# Patient Record
Sex: Female | Born: 1950 | Race: Black or African American | Hispanic: No | Marital: Single | Smoking: Never smoker
Health system: Southern US, Community
[De-identification: ages and names within clinical notes are randomized; demographics above are authoritative.]

## PROBLEM LIST (undated history)

## (undated) DIAGNOSIS — I2699 Other pulmonary embolism without acute cor pulmonale: Secondary | ICD-10-CM

## (undated) DIAGNOSIS — E785 Hyperlipidemia, unspecified: Secondary | ICD-10-CM

## (undated) DIAGNOSIS — N189 Chronic kidney disease, unspecified: Secondary | ICD-10-CM

## (undated) DIAGNOSIS — I10 Essential (primary) hypertension: Secondary | ICD-10-CM

## (undated) DIAGNOSIS — E119 Type 2 diabetes mellitus without complications: Secondary | ICD-10-CM

## (undated) DIAGNOSIS — I639 Cerebral infarction, unspecified: Secondary | ICD-10-CM

## (undated) HISTORY — PX: IVC FILTER INSERTION: CATH118245

## (undated) HISTORY — PX: TUBAL LIGATION: SHX77

## (undated) HISTORY — DX: Cerebral infarction, unspecified: I63.9

---

## 2016-11-13 ENCOUNTER — Emergency Department (HOSPITAL_COMMUNITY): Payer: Medicare HMO

## 2016-11-13 ENCOUNTER — Encounter (HOSPITAL_COMMUNITY): Payer: Self-pay

## 2016-11-13 ENCOUNTER — Observation Stay (HOSPITAL_COMMUNITY)
Admission: EM | Admit: 2016-11-13 | Discharge: 2016-11-15 | Disposition: A | Discharge status: Home / Self Care | Payer: Medicare HMO | Attending: Internal Medicine | Admitting: Internal Medicine

## 2016-11-13 ENCOUNTER — Observation Stay (HOSPITAL_COMMUNITY): Payer: Medicare HMO

## 2016-11-13 DIAGNOSIS — Z86711 Personal history of pulmonary embolism: Secondary | ICD-10-CM | POA: Diagnosis not present

## 2016-11-13 DIAGNOSIS — I10 Essential (primary) hypertension: Secondary | ICD-10-CM | POA: Diagnosis not present

## 2016-11-13 DIAGNOSIS — D72819 Decreased white blood cell count, unspecified: Secondary | ICD-10-CM | POA: Diagnosis present

## 2016-11-13 DIAGNOSIS — E041 Nontoxic single thyroid nodule: Secondary | ICD-10-CM | POA: Insufficient documentation

## 2016-11-13 DIAGNOSIS — D649 Anemia, unspecified: Secondary | ICD-10-CM | POA: Insufficient documentation

## 2016-11-13 DIAGNOSIS — Z7901 Long term (current) use of anticoagulants: Secondary | ICD-10-CM | POA: Insufficient documentation

## 2016-11-13 DIAGNOSIS — E785 Hyperlipidemia, unspecified: Secondary | ICD-10-CM | POA: Diagnosis not present

## 2016-11-13 DIAGNOSIS — I639 Cerebral infarction, unspecified: Secondary | ICD-10-CM | POA: Diagnosis not present

## 2016-11-13 DIAGNOSIS — I6330 Cerebral infarction due to thrombosis of unspecified cerebral artery: Secondary | ICD-10-CM | POA: Diagnosis not present

## 2016-11-13 DIAGNOSIS — R0989 Other specified symptoms and signs involving the circulatory and respiratory systems: Secondary | ICD-10-CM | POA: Diagnosis not present

## 2016-11-13 DIAGNOSIS — R791 Abnormal coagulation profile: Secondary | ICD-10-CM | POA: Diagnosis not present

## 2016-11-13 DIAGNOSIS — Z7982 Long term (current) use of aspirin: Secondary | ICD-10-CM | POA: Insufficient documentation

## 2016-11-13 DIAGNOSIS — D497 Neoplasm of unspecified behavior of endocrine glands and other parts of nervous system: Secondary | ICD-10-CM | POA: Diagnosis present

## 2016-11-13 DIAGNOSIS — Z794 Long term (current) use of insulin: Secondary | ICD-10-CM | POA: Insufficient documentation

## 2016-11-13 DIAGNOSIS — Z79899 Other long term (current) drug therapy: Secondary | ICD-10-CM | POA: Diagnosis not present

## 2016-11-13 DIAGNOSIS — G8191 Hemiplegia, unspecified affecting right dominant side: Secondary | ICD-10-CM | POA: Insufficient documentation

## 2016-11-13 DIAGNOSIS — F419 Anxiety disorder, unspecified: Secondary | ICD-10-CM | POA: Diagnosis present

## 2016-11-13 DIAGNOSIS — E119 Type 2 diabetes mellitus without complications: Secondary | ICD-10-CM | POA: Diagnosis not present

## 2016-11-13 DIAGNOSIS — E0789 Other specified disorders of thyroid: Secondary | ICD-10-CM

## 2016-11-13 DIAGNOSIS — G4733 Obstructive sleep apnea (adult) (pediatric): Secondary | ICD-10-CM | POA: Diagnosis not present

## 2016-11-13 DIAGNOSIS — Z888 Allergy status to other drugs, medicaments and biological substances status: Secondary | ICD-10-CM | POA: Insufficient documentation

## 2016-11-13 DIAGNOSIS — I119 Hypertensive heart disease without heart failure: Secondary | ICD-10-CM | POA: Diagnosis not present

## 2016-11-13 DIAGNOSIS — I351 Nonrheumatic aortic (valve) insufficiency: Secondary | ICD-10-CM | POA: Insufficient documentation

## 2016-11-13 DIAGNOSIS — R29898 Other symptoms and signs involving the musculoskeletal system: Secondary | ICD-10-CM | POA: Diagnosis present

## 2016-11-13 HISTORY — DX: Essential (primary) hypertension: I10

## 2016-11-13 HISTORY — DX: Hyperlipidemia, unspecified: E78.5

## 2016-11-13 HISTORY — DX: Other pulmonary embolism without acute cor pulmonale: I26.99

## 2016-11-13 HISTORY — DX: Type 2 diabetes mellitus without complications: E11.9

## 2016-11-13 LAB — PROTIME-INR
INR: 2.57
Prothrombin Time: 28.1 seconds — ABNORMAL HIGH (ref 11.4–15.2)

## 2016-11-13 LAB — GLUCOSE, CAPILLARY: Glucose-Capillary: 97 mg/dL (ref 65–99)

## 2016-11-13 MED ORDER — ACETAMINOPHEN 160 MG/5ML PO SOLN: 650.0000 mg | ORAL | Status: DC | PRN

## 2016-11-13 MED ORDER — ACETAMINOPHEN 325 MG PO TABS
650.0000 mg | ORAL_TABLET | ORAL | Status: DC | PRN
Start: 2016-11-13 — End: 2016-11-15

## 2016-11-13 MED ORDER — SENNOSIDES-DOCUSATE SODIUM 8.6-50 MG PO TABS: 1.0000 | ORAL_TABLET | Freq: Every evening | ORAL | Status: DC | PRN

## 2016-11-13 MED ORDER — ASPIRIN 325 MG PO TABS
325.0000 mg | ORAL_TABLET | Freq: Every day | ORAL | Status: DC
  Administered 2016-11-13 – 2016-11-14 (×2): 325 mg via ORAL
  Filled 2016-11-13 (×2): qty 1

## 2016-11-13 MED ORDER — ASPIRIN 300 MG RE SUPP: 300.0000 mg | Freq: Every day | RECTAL | Status: DC

## 2016-11-13 MED ORDER — ACETAMINOPHEN 650 MG RE SUPP: 650.0000 mg | RECTAL | Status: DC | PRN

## 2016-11-13 MED ORDER — ENOXAPARIN SODIUM 40 MG/0.4ML ~~LOC~~ SOLN: 40.0000 mg | SUBCUTANEOUS | Status: DC

## 2016-11-13 MED ORDER — SODIUM CHLORIDE 0.9 % IV SOLN
INTRAVENOUS | Status: DC
  Administered 2016-11-13: via INTRAVENOUS

## 2016-11-13 MED ORDER — STROKE: EARLY STAGES OF RECOVERY BOOK
Freq: Once | Status: DC
  Filled 2016-11-13: qty 1

## 2016-11-13 MED ORDER — PAROXETINE HCL 10 MG PO TABS
10.0000 mg | ORAL_TABLET | Freq: Every day | ORAL | Status: DC
  Administered 2016-11-14 – 2016-11-15 (×2): 10 mg via ORAL
  Filled 2016-11-13 (×2): qty 1

## 2016-11-13 NOTE — ED Triage Notes (Signed)
Pt. Here from [facility] via their critical care transport. Pt. Seen at 11:15 for right facial droop and right arm flaccid. Sudden onset at 9 am. Pt. On coumadin for PE and INR 2.8-would have been a candidate for TPA otherwise. Facial droop resolved, but right arm still very weak. Pt. Has no complaints. Pt. Sent here due to family request. Accepted by neurology. EDP at bedside now.

## 2016-11-13 NOTE — Progress Notes (Signed)
ANTICOAGULATION CONSULT NOTE - Initial Consult  Pharmacy Consult for  Coumadin  Indication: history of PE  Allergies  Allergen Reactions  . Ativan [Lorazepam] Other (See Comments)    COMBATIVE AND EXTREMELY VIOLENT    Patient Measurements: Height: 5' 5.5" (166.4 cm) Weight: 180 lb 11.2 oz (82 kg) IBW/kg (Calculated) : 58.15  Vital Signs: Temp: 99 F (37.2 C) (07/29 2100) Temp Source: Oral (07/29 2100) BP: 181/87 (07/29 2100) Pulse Rate: 80 (07/29 2100)  Labs:  Recent Labs  11/13/16 2109  LABPROT 28.1*  INR 2.57    CrCl cannot be calculated (No order found.).   Medical History: Past Medical History:  Diagnosis Date  . Diabetes mellitus without complication ([place])   . Hyperlipidemia   . Hypertension   . Pulmonary emboli (HCC)     Medications:  Prescriptions Prior to Admission  Medication Sig Dispense Refill Last Dose  . amLODipine (NORVASC) 5 MG tablet Take 5 mg by mouth daily.   11/12/2016 at Unknown time  . hydrochlorothiazide (HYDRODIURIL) 12.5 MG tablet Take 12.5 mg by mouth daily.   11/12/2016 at Unknown time  . metFORMIN (GLUCOPHAGE) 500 MG tablet Take 500 mg by mouth 2 (two) times daily with a meal.   11/12/2016 at Unknown time  . PARoxetine (PAXIL) 10 MG tablet Take 10 mg by mouth daily.   11/12/2016 at Unknown time  . triamcinolone cream (KENALOG) 0.1 % Apply 1 application topically daily as needed for itching.   11/12/2016 at Unknown time  . valsartan (DIOVAN) 160 MG tablet Take 160 mg by mouth daily.   11/12/2016 at Unknown time  . warfarin (COUMADIN) 5 MG tablet Take 5-7.5 mg by mouth See admin instructions. Take 5mg  on M W F and take 7.5mg  on Tue Thur Sat Sun   11/12/2016 at pm   Scheduled:  .  stroke: mapping our early stages of recovery book   Does not apply Once  . aspirin  300 mg Rectal Daily   Or  . aspirin  325 mg Oral Daily  . enoxaparin (LOVENOX) injection  40 mg Subcutaneous Q24H  . [START ON 11/14/2016] PARoxetine  10 mg Oral Daily     Assessment: 66 yo female here with CVA vs TIA. He is on coumadin PTA for history of PE.  Neuro recommends an MRI with further plans (continue or discontinue anticoagulation) based on results  -INR= 2.57  Home coumadin dose: 2.5mg /day except 5mg  on [facility] (last clinic visit 10/04/2016; INR= 2.8) -last dose taken 7/28  Goal of Therapy:  INR 2-3 Monitor platelets by anticoagulation protocol: Yes   Plan:  -No coumadin tonight -Will follow patient progress and plans for anticoagulation -PT/INR in am  Hildred Laser, Pharm D 11/13/2016 10:24 PM

## 2016-11-13 NOTE — ED Notes (Signed)
Admitting at bedside 

## 2016-11-13 NOTE — ED Notes (Signed)
Hospitalist states he dosen't know why the patient needs another CTA, talking to neurology to figure out the plan.  Okay to transport to the floor.

## 2016-11-13 NOTE — H&P (Signed)
History and Physical    Renee Payne PFX:[id number] DOB: 07/05/1950 DOA: 11/13/2016  PCP: Myrtis Hopping., MD   Patient coming from: Home, by way of [facility]  Chief Complaint: Right-sided weakness   HPI: Renee Payne is a 66 y.o. female with medical history significant for hypertension, hyperlipidemia, type 2 diabetes mellitus, and history of PE on Coumadin, now presenting to the emergency department for evaluation of right-sided weakness. Patient reports that she had been in her usual state until this morning when she woke with weakness involving her right side and possible right-sided facial droop. Onset was approximately 9 AM and she was brought in to the ED at [facility] for evaluation. She denied any recent fall or trauma, denies any similar symptoms previously, and denies any fevers, chills, chest pain, or palpitations.  In the outside ED, she had a normal chemistry panel and CBC with leukopenia, WBC 2700, and a stable normocytic anemia with hemoglobin of 12.1. Troponin was undetectable and INR was therapeutic at 2.81. She had EKG performed, demonstrating a normal sinus rhythm with LVH. Neurology was consulted by the ED physician, tPA was not given secondary to the elevated INR. She underwent CTA head and neck with no significant stenosis and no definite acute CVA. Incidental notation is made on the CTA of the thyroid nodule with recommendation to consider FNA.  Patient was transported to the emergency department to [facility] at the family's request. She has remained hemodynamically stable, in no apparent respiratory distress, reports continued improvement in her deficits, noting that she is almost back to baseline, and she will be observed on the telemetry unit for ongoing evaluation and management of a suspected acute stroke.   Review of Systems:  All other systems reviewed and apart from HPI, are negative.  Past Medical History:    Diagnosis Date  . Diabetes mellitus without complication ([place])   . Hyperlipidemia   . Hypertension   . Pulmonary emboli [facility])     Past Surgical History:  Procedure Laterality Date  . IVC FILTER INSERTION       reports that she has never smoked. She has never used smokeless tobacco. She reports that she does not drink alcohol or use drugs.  Allergies  Allergen Reactions  . Ativan [Lorazepam] Other (See Comments)    COMBATIVE AND EXTREMELY VIOLENT    History reviewed. No pertinent family history.   Prior to Admission medications   Medication Sig Start Date End Date Taking? Authorizing Provider  amLODipine (NORVASC) 5 MG tablet Take 5 mg by mouth daily. 09/20/16  Yes [provider]  hydrochlorothiazide (HYDRODIURIL) 12.5 MG tablet Take 12.5 mg by mouth daily. 09/19/16  Yes [provider]  metFORMIN (GLUCOPHAGE) 500 MG tablet Take 500 mg by mouth 2 (two) times daily with a meal.   Yes [provider]  PARoxetine (PAXIL) 10 MG tablet Take 10 mg by mouth daily.   Yes [provider]  triamcinolone cream (KENALOG) 0.1 % Apply 1 application topically daily as needed for itching. 04/16/15  Yes [provider]  valsartan (DIOVAN) 160 MG tablet Take 160 mg by mouth daily.   Yes [provider]  warfarin (COUMADIN) 5 MG tablet Take 5-7.5 mg by mouth See admin instructions. Take 5mg  on M W F and take 7.5mg  on Tue Thur Sat Sun   Yes [provider]    Physical Exam: Vitals:   11/13/16 1630 11/13/16 1700 11/13/16 1715 11/13/16 1900  BP: (!) 168/90 (!) 149/81 (!) 146/81 (!) 151/90  Pulse: 79 60 (!) 59 84  Resp: 15 17 20 20   SpO2: 96% 93% 97% 99%      Constitutional: NAD, calm, comfortable, obese. Eyes: PERTLA, lids and conjunctivae normal ENMT: Mucous membranes are moist. Posterior pharynx clear of any exudate or lesions.   Neck: normal, supple, no masses, no thyromegaly Respiratory: clear to auscultation bilaterally, no  wheezing, no crackles. Normal respiratory effort.    Cardiovascular: S1 & S2 heard, regular rate and rhythm. No extremity edema. No significant JVD. Abdomen: No distension, no tenderness, no masses palpated. Bowel sounds normal.  Musculoskeletal: no clubbing / cyanosis. No joint deformity upper and lower extremities.   Skin: no significant rashes, lesions, ulcers. Warm, dry, well-perfused. Neurologic: CN 2-12 grossly intact. Sensation intact, DTR normal. Proximal right arm strength 4/5, strength 5/5 elsewhere.  Psychiatric: Alert and oriented x 3. Calm and cooperative.     Labs on Admission: I have personally reviewed following labs and imaging studies  CBC: No results for input(s): WBC, NEUTROABS, HGB, HCT, MCV, PLT in the last 168 hours. Basic Metabolic Panel: No results for input(s): NA, K, CL, CO2, GLUCOSE, BUN, CREATININE, CALCIUM, MG, PHOS in the last 168 hours. GFR: CrCl cannot be calculated (No order found.). Liver Function Tests: No results for input(s): AST, ALT, ALKPHOS, BILITOT, PROT, ALBUMIN in the last 168 hours. No results for input(s): LIPASE, AMYLASE in the last 168 hours. No results for input(s): AMMONIA in the last 168 hours. Coagulation Profile: No results for input(s): INR, PROTIME in the last 168 hours. Cardiac Enzymes: No results for input(s): CKTOTAL, CKMB, CKMBINDEX, TROPONINI in the last 168 hours. BNP (last 3 results) No results for input(s): PROBNP in the last 8760 hours. HbA1C: No results for input(s): HGBA1C in the last 72 hours. CBG: No results for input(s): GLUCAP in the last 168 hours. Lipid Profile: No results for input(s): CHOL, HDL, LDLCALC, TRIG, CHOLHDL, LDLDIRECT in the last 72 hours. Thyroid Function Tests: No results for input(s): TSH, T4TOTAL, FREET4, T3FREE, THYROIDAB in the last 72 hours. Anemia Panel: No results for input(s): VITAMINB12, FOLATE, FERRITIN, TIBC, IRON, RETICCTPCT in the last 72 hours. Urine analysis: No results found  for: COLORURINE, APPEARANCEUR, LABSPEC, PHURINE, GLUCOSEU, HGBUR, BILIRUBINUR, KETONESUR, PROTEINUR, UROBILINOGEN, NITRITE, LEUKOCYTESUR Sepsis Labs: @LABRCNTIP (procalcitonin:4,lacticidven:4) )No results found for this or any previous visit (from the past 240 hour(s)).   Radiological Exams on Admission: No results found.  EKG: Independently reviewed. NSR, LVH.   Assessment/Plan  1. Right-sided weakness, suspected CVA  - Pt presented to the ED at [facility] with ~2 hours of right-sided weakness and slurred speech; reports that slurred speech was already improving  - She was not given tPA in light of elevated INR  - She had CTA head and neck with no significant stenosis, no definite acute CVA, and incidental thyroid nodule  - She was transferred to [facility] ED at family's request  - Neurology is consulting and much appreciated  - Plan for MRI with sedation, cardiac monitoring, frequent neuro checks, echocardiogram, PT/OT/SLP evals, A1c and fasting lipid panel, and risk-factor modification   2. Hx of PE  - Pt reports hx of PE, for which she takes coumadin  - INR was therapeutic at the outside hospital, 2.81  - If MRI is negative for bleed, she will be continued on coumadin with pharmacy to dose   3. Anxiety  - Continue Paxil   4. Hypertension - BP at goal  -  Hold Norvasc, Diovan, and HCTZ pending MRI for ?acute ischemic CVA   5. Type II DM  - No A1c on file  - Managed at home with metformin only  - Check CBG's q4h   - Start a low-intensity Novolog sliding-scale and adjust prn    6. Leukopenia  - WBC was 2,700 at [facility], previously normal  - No fever or infectious s/s  - Plan to culture if febrile, repeat CBC in am    7. OSA - Continue CPAP qHS   8. Thyroid nodule  - Noted incidentally on CTA head and neck from outside hospital - Follow-up with PCP recommended for consideration of FNA    DVT prophylaxis: sq Lovenox  Code Status: Full  Family  Communication: Discussed with patient Disposition Plan: Observe on telemetry Consults called: Neurology Admission status: Observation    Vianne Bulls, MD [facility] Pager [phone]  If 7PM-7AM, please contact night-coverage [email or web address] Password [facility]  11/13/2016, 7:15 PM

## 2016-11-13 NOTE — Consult Note (Addendum)
Neurology Consultation  Reason for Consult: Stroke Referring Physician: Dr. Dolly Rias  CC: Stroke transfer from [facility] hospital on family's request  History is obtained from: Patient and Dr. Lovena Le from [facility] regional  HPI: Renee Payne is a 66 y.o. female past medical history of diabetes, hypertension, hyperlipidemia, pulmonary emboli on Coumadin, who was in her usual state of health until about 9 AM this morning when she started noticing right face arm and leg weakness with slurred speech. She says that she went to bed normal yesterday. She woke up at 6 AM when she was normal. She went to take a nap, she woke up around 8 and felt normal when she woke up again around 9 AM, she could not move the right side of her body. Her speech was also slurred. She was taken to [facility], where she was per report evaluated by telemetry neurology. She was on Coumadin and her INR was above 1.7, for that reason, even with the initial NIH stroke scale of 5, she was not given TPA. The family requested that the patient be transferred to [facility]. The patient was transferred from the ER over at [facility] to the ER at [facility]. The patient was seen in the emergency room. Her symptoms had nearly resolved with the exception of right arm weakness. She denied any headaches. She denies any tingling or numbness. She denies any nausea vomiting. She denies any short of breath or chest pains at this time. She denies any preceding illnesses, fevers, chills. Denies any abnormal psychosocial stressors. Denies any history of strokes in the past  LKW: 9 AM tpa given?: no, therapeutic on Coumadin, presented initially to the outside hospital where decision was made not to give TPA. Arrival at [facility] ED after the TPA window. Low NIH stroke scale at [facility] Premorbid modified Rankin scale (mRS):  0-Completely asymptomatic and back to baseline post-stroke   ROS: A 14 point  ROS was performed and is negative except as noted in the HPI.    Past Medical History:  Diagnosis Date  . Diabetes mellitus without complication ([place])   . Hyperlipidemia   . Hypertension   . Pulmonary emboli ([place])     History reviewed. No pertinent family history.  Social History:   reports that she has never smoked. She has never used smokeless tobacco. She reports that she does not drink alcohol or use drugs.  Medication list-unavailable at the time of encounter. No current facility-administered medications for this encounter.  No current outpatient prescriptions on file.   Exam: Current vital signs: There were no vitals taken for this visit. Vital signs in last 24 hours:  heart rate 72, blood pressure 170/95, saturation 99% GENERAL: Awake, alert in NAD HEENT: - Normocephalic and atraumatic, dry mm, no LN++, no Thyromegally LUNGS - Clear to auscultation bilaterally with no wheezes CV - S1S2 RRR, no m/r/g, equal pulses bilaterally. ABDOMEN - Soft, nontender, nondistended with normoactive BS Ext: warm, well perfused, intact peripheral pulses, _no edema  NEURO:  Mental Status: AA&Ox3  Language: speech is not dysarthric  Naming, repetition, fluency, and comprehension intact. Cranial Nerves: PERR. EOMI, visual fields full, no facial asymmetry, facial sensation intact, hearing intact, tongue/uvula/soft palate midline, normal sternocleidomastoid and trapezius muscle strength. No evidence of tongue atrophy or fibrillations Motor: 3 out of 5 right upper extremity with the arm drifting to bed in 10 seconds, 4/5 right lower extremity, 5/5 left upper and left lower extremity.  Normal tone, normal range of motion, normal bulk. Sensation- Intact to light touch bilaterally, no extinction Coordination: FTN intact bilaterally, no ataxia in BLE. Gait- deferred  NIHSS 2 for right upper extremity.   Labs No labs available at the time of the encounter  Imaging No imaging available at  the time of the encounter. Would recommend imaging as below Outside imaging per report showed no evidence of bleed on noncontrast CT. CTA was also obtained. Unable to locate the disc at this time.   Assessment:  66 year old African-American woman with past medical history of diabetes hypertension hyperlipidemia who was on Coumadin for PE with last known normal this morning at around 9 AM with sudden onset of right-sided weakness. [facility] with initial stroke scale of 5. Family requested transfer to [place] not given at the outside hospital because of therapeutic INR. Upon arrival and assessment at [facility] ED, NIH stroke scale of 2 for right upper extremity weakness. No sensory loss, no cranial nerve findings, no cortical signs. No suspicion for large vessel occlusion. 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  Recommendations: -I would recommend obtaining a stat MRI of the brain. Depending on the size of the infarct, decision can be made whether to continue anticoagulation or discontinue it. No need for reversal at this time if the MRI does not show a bleed. ---Please call neurology after the MRI is done. Depending on the size of the bleed, decision on continuing anticoag with Coumadin or selecting heparin drip without bolus will be made. -Admit to hospitalist -Telemetry -Frequent neurochecks -Transthoracic echocardiogram -Hemoglobin A1c, lipid panel -Decision on continuing and regulation versus using antiplatelet based on imaging results. -Risk factor modification -PT OT and speech therapy consult Please page the stroke team with any questions from 8 AM to 4 PM. Stroke team will follow in the morning.  -- Amie Portland, MD [facility] [phone]  If 7pm to 7am, please call on call as listed on AMION.  CRITICAL CARE ADDENDUM This patient is critically ill and at significant risk of neurological worsening, death and care requires constant monitoring of vital signs, hemodynamics,respiratory and cardiac monitoring, neurological assessment, discussion with family, other specialists and medical decision making of high complexity. I spent 45  minutes of neurocritical care time  in the care of  this patient.

## 2016-11-13 NOTE — ED Notes (Signed)
MRI contacted RN because patient did not tolerate even laying in MRI. Neurology paged at this time-waiting for call back. Pt. Allergic to ativan and states last time she had it she was so aggressive and violent they had to sedate her with precedex.

## 2016-11-13 NOTE — Progress Notes (Signed)
Patient refuses CPAP for tonight.  RT does not stock nasal pillows (interface used by patient at home).

## 2016-11-14 ENCOUNTER — Encounter (HOSPITAL_COMMUNITY): Payer: Self-pay | Admitting: *Deleted

## 2016-11-14 ENCOUNTER — Observation Stay (HOSPITAL_BASED_OUTPATIENT_CLINIC_OR_DEPARTMENT_OTHER): Payer: Medicare HMO

## 2016-11-14 ENCOUNTER — Observation Stay (HOSPITAL_COMMUNITY): Payer: Medicare HMO

## 2016-11-14 DIAGNOSIS — I63312 Cerebral infarction due to thrombosis of left middle cerebral artery: Secondary | ICD-10-CM | POA: Diagnosis not present

## 2016-11-14 DIAGNOSIS — E0789 Other specified disorders of thyroid: Secondary | ICD-10-CM | POA: Diagnosis present

## 2016-11-14 DIAGNOSIS — I351 Nonrheumatic aortic (valve) insufficiency: Secondary | ICD-10-CM

## 2016-11-14 DIAGNOSIS — Z86711 Personal history of pulmonary embolism: Secondary | ICD-10-CM

## 2016-11-14 DIAGNOSIS — I10 Essential (primary) hypertension: Secondary | ICD-10-CM | POA: Diagnosis not present

## 2016-11-14 DIAGNOSIS — D72819 Decreased white blood cell count, unspecified: Secondary | ICD-10-CM | POA: Diagnosis not present

## 2016-11-14 DIAGNOSIS — E119 Type 2 diabetes mellitus without complications: Secondary | ICD-10-CM

## 2016-11-14 DIAGNOSIS — G4733 Obstructive sleep apnea (adult) (pediatric): Secondary | ICD-10-CM

## 2016-11-14 DIAGNOSIS — Z9989 Dependence on other enabling machines and devices: Secondary | ICD-10-CM

## 2016-11-14 DIAGNOSIS — D497 Neoplasm of unspecified behavior of endocrine glands and other parts of nervous system: Secondary | ICD-10-CM | POA: Diagnosis present

## 2016-11-14 DIAGNOSIS — R0989 Other specified symptoms and signs involving the circulatory and respiratory systems: Secondary | ICD-10-CM

## 2016-11-14 LAB — GLUCOSE, CAPILLARY
GLUCOSE-CAPILLARY: 104 mg/dL — AB (ref 65–99)
GLUCOSE-CAPILLARY: 140 mg/dL — AB (ref 65–99)
Glucose-Capillary: 114 mg/dL — ABNORMAL HIGH (ref 65–99)
Glucose-Capillary: 134 mg/dL — ABNORMAL HIGH (ref 65–99)
Glucose-Capillary: 97 mg/dL (ref 65–99)
Glucose-Capillary: 98 mg/dL (ref 65–99)

## 2016-11-14 LAB — LIPID PANEL
CHOLESTEROL: 254 mg/dL — AB (ref 0–200)
HDL: 42 mg/dL (ref >40)
LDL Cholesterol: 190 mg/dL — ABNORMAL HIGH (ref 0–99)
Total CHOL/HDL Ratio: 6 RATIO
Triglycerides: 112 mg/dL (ref <150)
VLDL: 22 mg/dL (ref 0–40)

## 2016-11-14 LAB — CBC
HEMATOCRIT: 36.3 % (ref 36.0–46.0)
HEMOGLOBIN: 11.6 g/dL — AB (ref 12.0–15.0)
MCH: 28.2 pg (ref 26.0–34.0)
MCHC: 32 g/dL (ref 30.0–36.0)
MCV: 88.3 fL (ref 78.0–100.0)
Platelets: 250 10*3/uL (ref 150–400)
RBC: 4.11 MIL/uL (ref 3.87–5.11)
RDW: 15.7 % — ABNORMAL HIGH (ref 11.5–15.5)
WBC: 3.1 10*3/uL — AB (ref 4.0–10.5)

## 2016-11-14 LAB — BASIC METABOLIC PANEL
ANION GAP: 7 (ref 5–15)
BUN: 13 mg/dL (ref 6–20)
CALCIUM: 9.2 mg/dL (ref 8.9–10.3)
CO2: 28 mmol/L (ref 22–32)
Chloride: 108 mmol/L (ref 101–111)
Creatinine, Ser: 0.96 mg/dL (ref 0.44–1.00)
GFR calc non Af Amer: 59 mL/min — ABNORMAL LOW (ref >60)
Glucose, Bld: 94 mg/dL (ref 65–99)
Potassium: 3.7 mmol/L (ref 3.5–5.1)
Sodium: 143 mmol/L (ref 135–145)

## 2016-11-14 LAB — URINALYSIS, ROUTINE W REFLEX MICROSCOPIC
BACTERIA UA: NONE SEEN
BILIRUBIN URINE: NEGATIVE
Glucose, UA: NEGATIVE mg/dL
Ketones, ur: NEGATIVE mg/dL
NITRITE: NEGATIVE
PH: 7 (ref 5.0–8.0)
Protein, ur: NEGATIVE mg/dL
SPECIFIC GRAVITY, URINE: 1.013 (ref 1.005–1.030)

## 2016-11-14 LAB — ECHOCARDIOGRAM COMPLETE
Height: 65.5 in
Weight: 2891.2 oz

## 2016-11-14 LAB — IRON AND TIBC
IRON: 58 ug/dL (ref 28–170)
Saturation Ratios: 20 % (ref 10.4–31.8)
TIBC: 287 ug/dL (ref 250–450)
UIBC: 229 ug/dL

## 2016-11-14 LAB — PROTIME-INR
INR: 2.6
PROTHROMBIN TIME: 28.4 s — AB (ref 11.4–15.2)

## 2016-11-14 LAB — RETICULOCYTES
RBC.: 4.11 MIL/uL (ref 3.87–5.11)
RETIC COUNT ABSOLUTE: 65.8 10*3/uL (ref 19.0–186.0)
Retic Ct Pct: 1.6 % (ref 0.4–3.1)

## 2016-11-14 LAB — FERRITIN: Ferritin: 86 ng/mL (ref 11–307)

## 2016-11-14 LAB — FOLATE: FOLATE: 9.9 ng/mL (ref >5.9)

## 2016-11-14 LAB — VITAMIN B12: VITAMIN B 12: 297 pg/mL (ref 180–914)

## 2016-11-14 MED ORDER — WARFARIN - PHARMACIST DOSING INPATIENT
Freq: Every day | Status: DC
  Administered 2016-11-14: 18:00:00

## 2016-11-14 MED ORDER — INSULIN ASPART 100 UNIT/ML ~~LOC~~ SOLN: 0.0000 [IU] | SUBCUTANEOUS | Status: DC

## 2016-11-14 MED ORDER — ATORVASTATIN CALCIUM 40 MG PO TABS: 40.0000 mg | ORAL_TABLET | Freq: Every day | ORAL | Status: DC

## 2016-11-14 MED ORDER — INSULIN ASPART 100 UNIT/ML ~~LOC~~ SOLN: 0.0000 [IU] | Freq: Three times a day (TID) | SUBCUTANEOUS | Status: DC

## 2016-11-14 MED ORDER — WARFARIN SODIUM 5 MG PO TABS
2.5000 mg | ORAL_TABLET | Freq: Once | ORAL | Status: AC
  Administered 2016-11-14: 2.5 mg via ORAL
  Filled 2016-11-14: qty 1

## 2016-11-14 MED ORDER — INSULIN ASPART 100 UNIT/ML ~~LOC~~ SOLN: 0.0000 [IU] | Freq: Every day | SUBCUTANEOUS | Status: DC

## 2016-11-14 MED ORDER — VITAMIN B-12 100 MCG PO TABS
500.0000 ug | ORAL_TABLET | Freq: Every day | ORAL | Status: DC
  Administered 2016-11-14 – 2016-11-15 (×2): 500 ug via ORAL
  Filled 2016-11-14 (×3): qty 5

## 2016-11-14 MED ORDER — ASPIRIN EC 81 MG PO TBEC
81.0000 mg | DELAYED_RELEASE_TABLET | Freq: Every day | ORAL | Status: DC
  Administered 2016-11-15: 81 mg via ORAL
  Filled 2016-11-14: qty 1

## 2016-11-14 MED ORDER — AMLODIPINE BESYLATE 5 MG PO TABS
5.0000 mg | ORAL_TABLET | Freq: Every day | ORAL | Status: DC
  Administered 2016-11-14 – 2016-11-15 (×2): 5 mg via ORAL
  Filled 2016-11-14 (×2): qty 1

## 2016-11-14 NOTE — Progress Notes (Signed)
Occupational Therapy Evaluation Patient Details Name: Renee Payne MRN: [id number] DOB: [birth year] Today's Date: 11/14/2016    History of Present Illness 66 y.o. female with medical history significant for hypertension, hyperlipidemia, type 2 diabetes mellitus, and history of PE on Coumadin, now presenting to the emergency department for evaluation of right-sided weakness. Transferred from [facility] hospital to [facility].CVA work up underway.    Clinical Impression   PTA, pt lived with her sister and was independent with mobility and ADL. Occasionally used a RW as needed. Pt presents primarily with RUE weakness. Pt will benefit from outpt OT at the neuro outpt center.Will follow up to address established goals and modified RW handle to assist with mobility.     Follow Up Recommendations  Outpatient OT;Supervision - Intermittent (neuro outpt)    Equipment Recommendations  None recommended by OT    Recommendations for Other Services       Precautions / Restrictions Precautions Precautions: None      Mobility Bed Mobility Overal bed mobility: Modified Independent                Transfers Overall transfer level: Modified independent                    Balance    no apparent balance deficits                                       ADL either performed or assessed with clinical judgement   ADL Overall ADL's : Needs assistance/impaired Eating/Feeding: Minimal assistance Eating/Feeding Details (indicate cue type and reason): A for cutting food/ opening packages Grooming: Minimal assistance   Upper Body Bathing: Minimal assistance;Sitting   Lower Body Bathing: Min guard   Upper Body Dressing : Minimal assistance   Lower Body Dressing: Minimal assistance   Toilet Transfer: Supervision/safety   Toileting- Clothing Manipulation and Hygiene: Set up       Functional mobility during ADLs: Supervision/safety       Vision Baseline  Vision/History: No visual deficits Vision Assessment?: No apparent visual deficits     Perception     Praxis Praxis Praxis tested?: Within functional limits    Pertinent Vitals/Pain Pain Assessment: No/denies pain     Hand Dominance Right   Extremity/Trunk Assessment Upper Extremity Assessment Upper Extremity Assessment: RUE deficits/detail RUE Deficits / Details: Brunstrom level V arm/hand. Substitutes with hiking shoulder due to proximal weakness. Poor isolated finger movement. Requires cues to use RUE.  RUE Coordination: decreased fine motor;decreased gross motor   Lower Extremity Assessment Lower Extremity Assessment: Defer to PT evaluation   Cervical / Trunk Assessment Cervical / Trunk Assessment: Normal   Communication Communication Communication: No difficulties   Cognition Arousal/Alertness: Awake/alert Behavior During Therapy: WFL for tasks assessed/performed Overall Cognitive Status: Within Functional Limits for tasks assessed                                     General Comments       Exercises Exercises: Other exercises Other Exercises Other Exercises: lap slides Other Exercises:  encouraging pt to use hand/arm  in functional movement patterns   Shoulder Instructions      Home Living Family/patient expects to be discharged to:: Private residence Living Arrangements: Alone;Children Available Help at Discharge: Family;Available 24 hours/day Type of Home: House  Bathroom Shower/Tub: Therapist, art: No   Home Equipment: Bedside commode;Walker - 2 wheels      Lives With:  (sister)    Prior Functioning/Environment Level of Independence: Independent (enjoyed cooking)                 OT Problem List: Decreased strength;Decreased range of motion;Decreased coordination;Decreased knowledge of use of DME or AE;Obesity;Impaired tone;Impaired UE functional  use      OT Treatment/Interventions: Self-care/ADL training;Therapeutic exercise;Neuromuscular education;DME and/or AE instruction;Therapeutic activities;Splinting;Patient/family education    OT Goals(Current goals can be found in the care plan section) Acute Rehab OT Goals Patient Stated Goal: to be able to cook again OT Goal Formulation: With patient Time For Goal Achievement: 11/28/16 Potential to Achieve Goals: Good ADL Goals Pt Will Perform Upper Body Dressing: with modified independence;sitting Pt Will Perform Lower Body Dressing: with modified independence;sit to/from stand Pt Will Perform Toileting - Clothing Manipulation and hygiene: with modified independence;sit to/from stand Pt/caregiver will Perform Home Exercise Program: Increased strength;Right Upper extremity;Independently;With written HEP provided  OT Frequency: Min 3X/week   Barriers to D/C:            Co-evaluation              AM-PAC PT "6 Clicks" Daily Activity     Outcome Measure Help from another person eating meals?: A Little Help from another person taking care of personal grooming?: A Little Help from another person toileting, which includes using toliet, bedpan, or urinal?: A Little Help from another person bathing (including washing, rinsing, drying)?: A Little Help from another person to put on and taking off regular upper body clothing?: A Little Help from another person to put on and taking off regular lower body clothing?: None 6 Click Score: 19   End of Session Equipment Utilized During Treatment: Gait belt Nurse Communication: Mobility status  Activity Tolerance: Patient tolerated treatment well Patient left: in chair;with call bell/phone within reach  OT Visit Diagnosis: Muscle weakness (generalized) (M62.81)                Time: 6579-0383 OT Time Calculation (min): 23 min Charges:  OT General Charges $OT Visit: 1 Procedure OT Evaluation $OT Eval Moderate Complexity: 1  Procedure OT Treatments $Self Care/Home Management : 8-22 mins G-Codes: OT G-codes **NOT FOR INPATIENT CLASS** Functional Assessment Tool Used: Clinical judgement Functional Limitation: Self care Self Care Current Status (F3832): At least 1 percent but less than 20 percent impaired, limited or restricted Self Care Goal Status (N1916): At least 1 percent but less than 20 percent impaired, limited or restricted   [facility], OT/L  [phone] 11/14/2016  Derrell Milanes,HILLARY 11/14/2016, 2:49 PM

## 2016-11-14 NOTE — Progress Notes (Signed)
PT Cancellation Note  Patient Details Name: Renee Payne MRN: [id number] DOB: 1950-09-02   Cancelled Treatment:    Reason Eval/Treat Not Completed: Patient at procedure or test/unavailable. Will check back later if time allows.  Rayford Halsted, [place] Office #[phone]   Rayford Halsted 11/14/2016, 11:10 AM

## 2016-11-14 NOTE — Progress Notes (Signed)
STROKE TEAM PROGRESS NOTE   HISTORY OF PRESENT ILLNESS (per record) CC: Stroke transfer from [facility] regional hospital on family's request  History is obtained from: Patient and Dr. Lovena Le from [facility] regional  HPI: Renee Payne is a 66 y.o. female past medical history of diabetes, hypertension, hyperlipidemia, pulmonary emboli on Coumadin, who was in her usual state of health until about 9 AM this on the morning of 11/13/2016 when she started noticing right face arm and leg weakness with slurred speech. She says that she went to bed normal on 11/12/2016.  She woke up at 6 AM when she was normal. She went to take a nap, she woke up around 8 and felt normal when she woke up again around 9 AM, she could not move the right side of her body. Her speech was also slurred. She was taken to [facility], where she was per report evaluated by telemetry neurology. She was on Coumadin and her INR was above 1.7, for that reason, even with the initial NIH stroke scale of 5, she was not given TPA.  The family requested that the patient be transferred to [facility]. The patient was transferred from the ER over at [facility] regional to the ER at [facility].  The patient was seen in the emergency room. Her symptoms had nearly resolved with the exception of right arm weakness.  She denied any headaches. She denies any tingling or numbness. She denies any nausea vomiting. She denies any short of breath or chest pains at this time. She denies any preceding illnesses, fevers, chills. Denies any abnormal psychosocial stressors. Denies any history of strokes in the past.  LKW: 9 AM Premorbid modified Rankin scale (mRS):  0-Completely asymptomatic and back to baseline post-stroke  ROS: A 14 point ROS was performed and is negative except as noted in the HPI.    Patient was not administered IV t-PA secondary to being on warfarin. She was admitted to General Neurology for further evaluation and  treatment.   SUBJECTIVE (INTERVAL HISTORY) No family is at the bedside.  Patient refusing MRI due to claustrophobia.  Will repeat CT in am. Pt INR therapeutic range. However, she had multiple risk factors not under control. Left UE much improved from yesterday. She denies any LLE involvement.    OBJECTIVE Temp:  [97.7 F (36.5 C)-99.1 F (37.3 C)] 99.1 F (37.3 C) (07/30 1322) Pulse Rate:  [59-88] 88 (07/30 1322) Cardiac Rhythm: Normal sinus rhythm (07/30 0700) Resp:  [15-20] 16 (07/30 1322) BP: (142-181)/(81-109) 158/109 (07/30 1322) SpO2:  [92 %-99 %] 97 % (07/30 1322) Weight:  [82 kg (180 lb 11.2 oz)] 82 kg (180 lb 11.2 oz) (07/29 2100)  CBC:  Recent Labs Lab 11/14/16 0353  WBC 3.1*  HGB 11.6*  HCT 36.3  MCV 88.3  PLT 737    Basic Metabolic Panel:  Recent Labs Lab 11/14/16 0353  NA 143  K 3.7  CL 108  CO2 28  GLUCOSE 94  BUN 13  CREATININE 0.96  CALCIUM 9.2    Lipid Panel:    Component Value Date/Time   CHOL 254 (H) 11/14/2016 0353   TRIG 112 11/14/2016 0353   HDL 42 11/14/2016 0353   CHOLHDL 6.0 11/14/2016 0353   VLDL 22 11/14/2016 0353   LDLCALC 190 (H) 11/14/2016 0353   HgbA1c: No results found for: HGBA1C Urine Drug Screen: No results found for: LABOPIA, COCAINSCRNUR, LABBENZ, AMPHETMU, THCU, LABBARB  Alcohol Level No results  found for: [facility]  IMAGING I have personally reviewed the radiological images below and agree with the radiology interpretations.  CT head no acute abnormalities  CTA head and neck - no significant stenosis and no definite acute CVA. Incidental notation is made on the CTA of the thyroid nodule with recommendation to consider FNA.  2D Echo 11/14/2016 Study Conclusions - Left ventricle: The cavity size was normal. Wall thickness was increased in a pattern of mild LVH. Systolic function was normal.  The estimated ejection fraction was in the range of 60% to 65%.  Wall motion was normal; there were no regional wall motion  abnormalities. Doppler parameters are consistent with abnormal left ventricular relaxation (grade 1 diastolic dysfunction). - Aortic valve: There was mild regurgitation. - Mitral valve: Calcified annulus. - Pulmonary arteries: Systolic pressure was mildly increased. PA peak pressure: 39 mm Hg (S). Impressions: - No cardiac source of emboli was indentified.  Repeat CT head pending   PHYSICAL EXAM  Temp:  [97.7 F (36.5 C)-99.1 F (37.3 C)] 99.1 F (37.3 C) (07/30 1322) Pulse Rate:  [59-88] 88 (07/30 1322) Resp:  [16-20] 16 (07/30 1322) BP: (146-181)/(81-109) 158/109 (07/30 1322) SpO2:  [92 %-99 %] 97 % (07/30 1322) Weight:  [180 lb 11.2 oz (82 kg)] 180 lb 11.2 oz (82 kg) (07/29 2100)  General - Well nourished, well developed, in no apparent distress.  Ophthalmologic - Sharp disc margins OU.   Cardiovascular - Regular rate and rhythm.  Mental Status -  Level of arousal and orientation to time, place, and person were intact. Language including expression, naming, repetition, comprehension was assessed and found intact. Fund of Knowledge was assessed and was intact.  Cranial Nerves II - XII - II - Visual field intact OU. III, IV, VI - Extraocular movements intact. V - Facial sensation intact bilaterally. VII - right facial droop. VIII - Hearing & vestibular intact bilaterally. X - Palate elevates symmetrically. XI - Chin turning & shoulder shrug intact bilaterally. XII - Tongue protrusion intact.  Motor Strength - The patient's strength was normal in all extremities except right UE 4/5 and pronator drift was present.  Bulk was normal and fasciculations were absent.   Motor Tone - Muscle tone was assessed at the neck and appendages and was normal.  Reflexes - The patient's reflexes were 1+ in all extremities and she had no pathological reflexes.  Sensory - Light touch, temperature/pinprick were assessed and were symmetrical.    Coordination - The patient had dysmetria  at right FTN proportional to weakness.  Tremor was absent.  Gait and Station - deferred.   ASSESSMENT/PLAN Ms. Renee Payne is a 66 y.o. female with history of diabetes, hypertension, hyperlipidemia, pulmonary emboli on Coumadin, who was in her usual state of health until about 9 AM this on the morning of 11/13/2016 when she started noticing right face arm and leg weakness with slurred speech. She did not receive IV t-PA due to being on warfarin.   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 Unlikely cardioembolic due to therapeutic INR.  Resultant  Right facial droop and right arm weakness  CT head no acute abnormalities  CTA head and neck unremarkable  MRI head cancelled due to claustrophobia  CT head repeat pending  2D Echo: EF 60-65%.  No source of embolus.  Mild LVH, grade 1 DD.    TCD bubble study - no  window  LDL 190  HgbA1c pending  SCDs for VTE prophylaxis  Diet heart healthy/carb modified Room service appropriate? Yes; Fluid consistency: Thin  warfarin daily prior to admission, now on aspirin 325 mg daily. OK to continue coumadin due to likely small infarct. INR goal 2-3. Continue ASA 81mg  for stroke prevention also due to small vessel source likely.  Patient counseled to be compliant with her antithrombotic medications  Ongoing aggressive stroke risk factor management  Therapy recommendations:  pending  Disposition:  Pending  PE on coumadin  Ok to continue coumadin from stroke standpoint  TCD bubble study no window  Not sure the trigger for PE  Pt denies any family hx of clotting disorder and no miscarriages in the past  Hypertension  Stable Permissive hypertension (OK if < 220/120) but gradually normalize in 5-7 days Long-term BP goal normotensive  Hyperlipidemia  Home meds: none  LDL 190, goal < 70  Not tolerating statins  in the past  Recommend to follow up with PCP to consider PCSK9 inhibitors. Pt agrees  Diabetes  HgbA1c pending, goal < 7.0  Controlled  SSI  Other Stroke Risk Factors  Advanced age  Obesity, recommend weight loss, diet and exercise as appropriate   OSA on CPAP  Other Active Problems  None  Hospital day # 0  Rosalin Hawking, MD PhD Stroke Neurology 11/14/2016 5:29 PM    To contact Stroke Continuity provider, please refer to [email or web address] After hours, contact General Neurology

## 2016-11-14 NOTE — Evaluation (Signed)
Speech Language Pathology Evaluation Patient Details Name: JURY CASERTA MRN: [id number] DOB: 02-Jun-1950 Today's Date: 11/14/2016 Time: 0930-1016 SLP Time Calculation (min) (ACUTE ONLY): 46 min  Problem List:  Patient Active Problem List   Diagnosis Date Noted  . 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 Leukopenia   . Essential hypertension 11/13/2016  . History of pulmonary embolism 11/13/2016  . Anxiety 11/13/2016  . Right arm weakness 11/13/2016  . Suspected stroke patient last known to be well more than 2 hours ago 11/13/2016  . Diabetes mellitus type II, non insulin dependent ([place]) 11/13/2016  . OSA (obstructive sleep apnea) 11/13/2016  . Suspected cerebrovascular accident (CVA) 11/13/2016   Past Medical History:  Past Medical History:  Diagnosis Date  . Diabetes mellitus without complication ([place])   . Hyperlipidemia   . Hypertension   . Pulmonary emboli [facility])    Past Surgical History:  Past Surgical History:  Procedure Laterality Date  . IVC FILTER INSERTION     HPI:  66 yo female transferred from [facility] hospital to [facility] after having right extremity weakness and dysarthria *pt denies dysarthria.  PMH + for DM, HTN, PE s/p IVC filter, HLD.  Pt underwent CT scan at [place], but results not posted in chart.     Assessment / Plan / Recommendation Clinical Impression  MOCA 7.3 administered with pt scoring 19/25 - areas of strength include language, naming, orientation and attention for non=math task.  Using visual cues did not improve performance on math task, but pt reports this to be baseline 'I was never good at math'.  Pt admits to premorbid hx of memory loss x one year but states she does not miss appointments, medications, etc.  She did recall 3/5 words without cues and 2/5 with cues indicating retrieval deficits.  Of note, pt observed to attempt to zip purse closed with her right hand that is weak = she did not request assist and declined when SLP offered - ?  basic problem solving or behavioral.  SlP provided pt with memory compensation strategies verbally and in writing - using teach back and functional situation descriptions.   Pt and family deny cognitive linguistic nor speech/language changes at this time.   Will sign off as education completed for compensations.      SLP Assessment  SLP Recommendation/Assessment: Patient needs continued Speech Lanaguage Pathology Services SLP Visit Diagnosis: Cognitive communication deficit (R41.841)    Follow Up Recommendations  None    Frequency and Duration           SLP Evaluation Cognition  Orientation Level: Oriented X4 Attention: Selective Selective Attention: Appears intact Memory: Impaired Memory Impairment: Retrieval deficit (recalled 3/5 words independently, 2/5 with cues) Problem Solving: Impaired Problem Solving Impairment: Functional basic (pt attempting to zip up purse with her right hand that weak, slp offered help but pt declined)       Comprehension  Auditory Comprehension Overall Auditory Comprehension: Appears within functional limits for tasks assessed Yes/No Questions: Not tested Commands: Within Functional Limits Conversation: Complex Visual Recognition/Discrimination Discrimination: Within Function Limits Reading Comprehension Reading Status: Not tested    Expression Expression Primary Mode of Expression: Verbal Verbal Expression Overall Verbal Expression: Appears within functional limits for tasks assessed Initiation: No impairment Repetition: No impairment Naming: No impairment Pragmatics: No impairment Written Expression Dominant Hand: Right Written Expression: Not tested (pt is weak)   Oral / Motor  Oral Motor/Sensory Function Overall Oral Motor/Sensory Function: Within functional limits (pt with natural rapid rate of  speech) Motor Speech Overall Motor Speech: Appears within functional limits for tasks assessed Respiration: Within functional  limits Resonance: Within functional limits Articulation: Within functional limitis Intelligibility: Intelligible Motor Planning: Witnin functional limits Motor Speech Errors: Not applicable Interfering Components: Premorbid status (pt with rapid rate of speech at baseline)   GO          Functional Assessment Tool Used: MOCA- cognitive evaluation Functional Limitations: Memory Memory Current Status (S1779): At least 20 percent but less than 40 percent impaired, limited or restricted Memory Goal Status (T9030): At least 20 percent but less than 40 percent impaired, limited or restricted Memory Discharge Status [phone]): At least 20 percent but less than 40 percent impaired, limited or restricted         Macario Golds 11/14/2016, 10:31 AM  Luanna Salk, [facility] SLP [phone]

## 2016-11-14 NOTE — Progress Notes (Signed)
PROGRESS NOTE    Renee Payne  CXK:[id number] DOB: 1950/06/20 DOA: 11/13/2016 PCP: Myrtis Hopping., MD   Outpatient Specialists:     Brief Narrative:  Renee Payne is a 66 y.o. female with medical history significant for hypertension, hyperlipidemia, type 2 diabetes mellitus, and history of PE on Coumadin, now presenting to the emergency department for evaluation of right-sided weakness. Patient reports that she had been in her usual state until this morning when she woke with weakness involving her right side and possible right-sided facial droop. Onset was approximately 9 AM and she was brought in to the ED at [facility] for evaluation. She denied any recent fall or trauma, denies any similar symptoms previously, and denies any fevers, chills, chest pain, or palpitations.  In the outside ED, she had a normal chemistry panel and CBC with leukopenia, WBC 2700, and a stable normocytic anemia with hemoglobin of 12.1. Troponin was undetectable and INR was therapeutic at 2.81. She had EKG performed, demonstrating a normal sinus rhythm with LVH. Neurology was consulted by the ED physician, tPA was not given secondary to the elevated INR. She underwent CTA head and neck with no significant stenosis and no definite acute CVA. Incidental notation is made on the CTA of the thyroid nodule with recommendation to consider FNA.  Patient was transported to the emergency department to [facility] at the family's request. She has remained hemodynamically stable, in no apparent respiratory distress, reports continued improvement in her deficits, noting that she is almost back to baseline, and she will be observed on the telemetry unit for ongoing evaluation and management of a suspected acute stroke.   Assessment & Plan:   Principal Problem:   Suspected stroke patient last known to be well more than 2 hours ago Active Problems:   Essential hypertension   History of  pulmonary embolism   Anxiety   Right arm weakness   Diabetes mellitus type II, non insulin dependent (HCC)   OSA (obstructive sleep apnea)   Suspected cerebrovascular accident (CVA)   Thyroid incidentaloma   Leukopenia   Right-sided weakness, suspected CVA  - Pt presented to the ED at [facility] with ~2 hours of right-sided weakness and slurred speech; reports that slurred speech was already improving  - She was not given tPA in light of elevated INR  - She had CTA head and neck with no significant stenosis, no definite acute CVA, and incidental thyroid nodule  - She was transferred to [facility] ED at family's request  - Neurology: repeat CT scan in AM unable to do MRI due to claustrophobia  Hx of PE  - Pt reports hx of PE, for which she takes coumadin  - INR was therapeutic at the outside hospital, 2.81   Anxiety  - Continue Paxil   Hypertension -permissive HTN for now -will start norvasc for now  Type II DM  - No A1c on file  - Managed at home with metformin only  - Start a low-intensity Novolog sliding-scale and adjust prn    Leukopenia  - WBC was 2,700 at [facility], previously normal  - No fever or infectious s/s  - Plan to culture if febrile, repeat CBC in am    OSA - Continue CPAP qHS   Thyroid nodule  - Noted incidentally on CTA head and neck from outside hospital - Follow-up with PCP recommended for consideration of FNA    DVT prophylaxis:  Lovenox  Code Status: Full Code   Family Communication:   Disposition Plan:     Consultants:        Subjective: Not SOB, no chest pain  Objective: Vitals:   11/14/16 0326 11/14/16 0500 11/14/16 0918 11/14/16 1322  BP: (!) 170/95 (!) 148/86 (!) 147/84 (!) 158/109  Pulse: 64 66 80 88  Resp: 20  16 16   Temp: 98.3 F (36.8 C) 97.7 F (36.5 C) 98.7 F (37.1 C) 99.1 F (37.3 C)  TempSrc: Oral Oral Oral Oral  SpO2: 95% 97% 92% 97%  Weight:      Height:         Intake/Output Summary (Last 24 hours) at 11/14/16 1510 Last data filed at 11/14/16 0538  Gross per 24 hour  Intake              925 ml  Output              400 ml  Net              525 ml   Filed Weights   11/13/16 2100  Weight: 82 kg (180 lb 11.2 oz)    Examination:  General exam: Appears calm and comfortable  Respiratory system: Clear to auscultation. Respiratory effort normal. Cardiovascular system: S1 & S2 heard, RRR. No JVD, murmurs, rubs, gallops or clicks. No pedal edema. Gastrointestinal system: Abdomen is obese, soft and nontender. No organomegaly or masses felt. Normal bowel sounds heard. Central nervous system: Alert and oriented. No focal neurological deficits. Extremities: Symmetric 5 x 5 power. Skin: No rashes, lesions or ulcers Psychiatry: Judgement and insight appear normal. Mood & affect appropriate.     Data Reviewed: I have personally reviewed following labs and imaging studies  CBC:  Recent Labs Lab 11/14/16 0353  WBC 3.1*  HGB 11.6*  HCT 36.3  MCV 88.3  PLT 237   Basic Metabolic Panel:  Recent Labs Lab 11/14/16 0353  NA 143  K 3.7  CL 108  CO2 28  GLUCOSE 94  BUN 13  CREATININE 0.96  CALCIUM 9.2   GFR: Estimated Creatinine Clearance: 59.9 mL/min (by C-G formula based on SCr of 0.96 mg/dL). Liver Function Tests: No results for input(s): AST, ALT, ALKPHOS, BILITOT, PROT, ALBUMIN in the last 168 hours. No results for input(s): LIPASE, AMYLASE in the last 168 hours. No results for input(s): AMMONIA in the last 168 hours. Coagulation Profile:  Recent Labs Lab 11/13/16 2109 11/14/16 0353  INR 2.57 2.60   Cardiac Enzymes: No results for input(s): CKTOTAL, CKMB, CKMBINDEX, TROPONINI in the last 168 hours. BNP (last 3 results) No results for input(s): PROBNP in the last 8760 hours. HbA1C: No results for input(s): HGBA1C in the last 72 hours. CBG:  Recent Labs Lab 11/13/16 2058 11/14/16 0442 11/14/16 0749 11/14/16 1143   GLUCAP 97 97 104* 98   Lipid Profile:  Recent Labs  11/14/16 0353  CHOL 254*  HDL 42  LDLCALC 190*  TRIG 112  CHOLHDL 6.0   Thyroid Function Tests: No results for input(s): TSH, T4TOTAL, FREET4, T3FREE, THYROIDAB in the last 72 hours. Anemia Panel:  Recent Labs  11/14/16 0353  VITAMINB12 297  FOLATE 9.9  FERRITIN 86  TIBC 287  IRON 58  RETICCTPCT 1.6   Urine analysis:    Component Value Date/Time   COLORURINE STRAW (A) 11/14/2016 0138   APPEARANCEUR CLEAR 11/14/2016 0138   LABSPEC 1.013 11/14/2016 0138   PHURINE 7.0 11/14/2016 0138   GLUCOSEU NEGATIVE 11/14/2016 0138  HGBUR SMALL (A) 11/14/2016 0138   BILIRUBINUR NEGATIVE 11/14/2016 0138   KETONESUR NEGATIVE 11/14/2016 0138   PROTEINUR NEGATIVE 11/14/2016 0138   NITRITE NEGATIVE 11/14/2016 0138   LEUKOCYTESUR TRACE (A) 11/14/2016 0138     )No results found for this or any previous visit (from the past 240 hour(s)).    Anti-infectives    None       Radiology Studies: Dg Chest 2 View  Result Date: 11/13/2016 CLINICAL DATA:  Right arm weakness and right-sided facial droop. EXAM: CHEST  2 VIEW COMPARISON:  Radiographs of May 18, 2015. FINDINGS: Stable cardiomegaly. Hypoinflation of the lungs is noted. No acute pulmonary disease is noted. No pneumothorax or pleural effusion is noted. Bony thorax is unremarkable. IMPRESSION: Hypoinflation of the lungs.  No other abnormality seen in the chest. Electronically Signed   By: Marijo Conception, M.D.   On: 11/13/2016 21:10        Scheduled Meds: .  stroke: mapping our early stages of recovery book   Does not apply Once  . amLODipine  5 mg Oral Daily  . aspirin EC  81 mg Oral Daily  . atorvastatin  40 mg Oral q1800  . insulin aspart  0-15 Units Subcutaneous TID WC  . insulin aspart  0-5 Units Subcutaneous QHS  . PARoxetine  10 mg Oral Daily  . vitamin B-12  500 mcg Oral Daily   Continuous Infusions:   LOS: 0 days    Time spent: 35  min    [place], DO [facility] Pager [phone]  If 7PM-7AM, please contact night-coverage [email or web address] Password [email or web address] 11/14/2016, 3:11 PM

## 2016-11-14 NOTE — Progress Notes (Signed)
OT Cancellation Note  Patient Details Name: Renee Payne MRN: [id number] DOB: [birth year]   Cancelled Treatment:    Reason Eval/Treat Not Completed: Patient at procedure or test/ unavailable (echo lab. Will attempt to return later today.)  [facility], OT/L  [phone] 11/14/2016 11/14/2016, 11:43 AM

## 2016-11-14 NOTE — Progress Notes (Signed)
Nutrition Brief Note  Patient identified on the Malnutrition Screening Tool (MST) Report.  Wt Readings from Last 15 Encounters:  11/13/16 180 lb 11.2 oz (82 kg)   Body mass index is 29.61 kg/m. Patient meets criteria for Overweight based on current BMI.   Current diet order is Heart Healthy/Carbohydrate Modified, patient is consuming approximately 100% of meals at this time. Labs and medications reviewed.   No nutrition interventions warranted at this time. If nutrition issues arise, please consult RD.   Arthur Holms, RD, LDN Pager #: [phone] After-Hours Pager #: [phone]

## 2016-11-14 NOTE — Progress Notes (Signed)
  Echocardiogram 2D Echocardiogram has been performed.  Johny Chess 11/14/2016, 11:14 AM

## 2016-11-14 NOTE — Progress Notes (Addendum)
ANTICOAGULATION CONSULT NOTE - follow up Pharmacy Consult for  Coumadin  Indication: history of PE  Allergies  Allergen Reactions  . Ativan [Lorazepam] Other (See Comments)    COMBATIVE AND EXTREMELY VIOLENT    Patient Measurements: Height: 5' 5.5" (166.4 cm) Weight: 180 lb 11.2 oz (82 kg) IBW/kg (Calculated) : 58.15  Vital Signs: Temp: 99.1 F (37.3 C) (07/30 1322) Temp Source: Oral (07/30 1322) BP: 158/109 (07/30 1322) Pulse Rate: 88 (07/30 1322)  Labs:  Recent Labs  11/13/16 2109 11/14/16 0353  HGB  --  11.6*  HCT  --  36.3  PLT  --  250  LABPROT 28.1* 28.4*  INR 2.57 2.60  CREATININE  --  0.96    Estimated Creatinine Clearance: 59.9 mL/min (by C-G formula based on SCr of 0.96 mg/dL).   Medical History: Past Medical History:  Diagnosis Date  . Diabetes mellitus without complication ([place])   . Hyperlipidemia   . Hypertension   . Pulmonary emboli (HCC)     Medications:  Prescriptions Prior to Admission  Medication Sig Dispense Refill Last Dose  . amLODipine (NORVASC) 5 MG tablet Take 5 mg by mouth daily.   11/12/2016 at Unknown time  . hydrochlorothiazide (HYDRODIURIL) 12.5 MG tablet Take 12.5 mg by mouth daily.   11/12/2016 at Unknown time  . metFORMIN (GLUCOPHAGE) 500 MG tablet Take 500 mg by mouth 2 (two) times daily with a meal.   11/12/2016 at Unknown time  . PARoxetine (PAXIL) 10 MG tablet Take 10 mg by mouth daily.   11/12/2016 at Unknown time  . triamcinolone cream (KENALOG) 0.1 % Apply 1 application topically daily as needed for itching.   11/12/2016 at Unknown time  . valsartan (DIOVAN) 160 MG tablet Take 160 mg by mouth daily.   11/12/2016 at Unknown time  . warfarin (COUMADIN) 5 MG tablet Take 5-7.5 mg by mouth See admin instructions. Take 5mg  on M W F and take 7.5mg  on Tue Thur Sat Sun   11/12/2016 at pm   Scheduled:  .  stroke: mapping our early stages of recovery book   Does not apply Once  . amLODipine  5 mg Oral Daily  . aspirin EC  81 mg Oral  Daily  . atorvastatin  40 mg Oral q1800  . insulin aspart  0-15 Units Subcutaneous TID WC  . insulin aspart  0-5 Units Subcutaneous QHS  . PARoxetine  10 mg Oral Daily  . vitamin B-12  500 mcg Oral Daily  . Warfarin - Pharmacist Dosing Inpatient   Does not apply q1800    Assessment: 66 yo female here with CVA vs TIA. He is on coumadin PTA for history of PE.  Neuro recommended an MRI with further plans (continue or discontinue anticoagulation) based on results but unable to do MRI due to claustrophobia. Plan to repat CT scan in AM.   Pharmacy consulted to dose coumadin for h/o PE>  -INR= 2.60 today    Home coumadin dose: 2.5mg /day except 5mg  on MWF (last clinic visit 10/04/2016; INR= 2.8) -last dose taken 7/28   Goal of Therapy:  INR 2-3  (target lower end of goal per Dr. Eliseo Squires 7/30) Monitor platelets by anticoagulation protocol: Yes   Plan:  -Coumadin 2.5mg  tonight (lower dose/ target lower end of goal as discussed with Dr. Eliseo Squires) --PT/INR in am   Nicole Cella, [place] Pharmacist Pager: [phone] 8A-4P [phone] 4P-10P Page [phone]  11/14/2016 4:07 PM

## 2016-11-14 NOTE — Evaluation (Signed)
Physical Therapy Evaluation Patient Details Name: Renee Payne MRN: [id number] DOB: 1951/01/13 Today's Date: 11/14/2016   History of Present Illness  66 y.o. female with medical history significant for hypertension, hyperlipidemia, type 2 diabetes mellitus, and history of PE on Coumadin, now presenting to the emergency department for evaluation of right-sided weakness. Transferred from [facility] hospital to [facility] with suspected CVA.  Stroke workup pending.   Clinical Impression  This pt's primary deficits are in her right hand.  I would like to further assess independence with RW use and requested OT bring a walker hand splint. Pt will likely have most needs for right hand/OT f/u after gait is determined to be stable.   PT to follow acutely for deficits listed below.     Follow Up Recommendations No PT follow up    Equipment Recommendations  None recommended by PT    Recommendations for Other Services   NA    Precautions / Restrictions Precautions Precautions: None      Mobility  Bed Mobility               General bed mobility comments: Pt was OOB in the recliner chair when PT entered the room.   Transfers Overall transfer level: Modified independent                  Ambulation/Gait Ambulation/Gait assistance: Min guard Ambulation Distance (Feet): 130 Feet Assistive device: Rolling walker (2 wheeled) Gait Pattern/deviations: Step-through pattern     General Gait Details: Pt really only needed assist to keep her right hand on RW.  Spoke with OT and recommended they bring and hand splint/adaptation for the RW to try.       Modified Rankin (Stroke Patients Only) Modified Rankin (Stroke Patients Only) Pre-Morbid Rankin Score: No significant disability Modified Rankin: Moderately severe disability     Balance Overall balance assessment: Needs assistance Sitting-balance support: Feet supported;No upper extremity supported Sitting balance-Leahy Scale:  Good     Standing balance support: Bilateral upper extremity supported;No upper extremity supported;Single extremity supported Standing balance-Leahy Scale: Fair                               Pertinent Vitals/Pain Pain Assessment: No/denies pain    Home Living Family/patient expects to be discharged to:: Private residence Living Arrangements: Alone;Children Available Help at Discharge: Family;Available 24 hours/day Type of Home: House Home Access: Stairs to enter Entrance Stairs-Rails: None Entrance Stairs-Number of Steps: 1 Home Layout: One level Home Equipment: Bedside commode;Walker - 2 wheels      Prior Function Level of Independence: Independent (enjoyed cooking)               Hand Dominance   Dominant Hand: Right    Extremity/Trunk Assessment   Upper Extremity Assessment Upper Extremity Assessment: Defer to OT evaluation    Lower Extremity Assessment Lower Extremity Assessment: Overall WFL for tasks assessed (5/5 equal bil, sensation intact to LT and equal bil)    Cervical / Trunk Assessment Cervical / Trunk Assessment: Normal  Communication   Communication: No difficulties  Cognition Arousal/Alertness: Awake/alert Behavior During Therapy: WFL for tasks assessed/performed Overall Cognitive Status: Within Functional Limits for tasks assessed  Assessment/Plan    PT Assessment Patient needs continued PT services  PT Problem List Decreased balance;Decreased mobility;Decreased knowledge of use of DME       PT Treatment Interventions DME instruction;Gait training;Stair training;Functional mobility training;Therapeutic activities;Therapeutic exercise;Balance training;Patient/family education    PT Goals (Current goals can be found in the Care Plan section)  Acute Rehab PT Goals Patient Stated Goal: to get back her right hand strength PT Goal Formulation: With patient Time  For Goal Achievement: 11/28/16 Potential to Achieve Goals: Good    Frequency Min 4X/week    AM-PAC PT "6 Clicks" Daily Activity  Outcome Measure Difficulty turning over in bed (including adjusting bedclothes, sheets and blankets)?: None Difficulty moving from lying on back to sitting on the side of the bed? : None Difficulty sitting down on and standing up from a chair with arms (e.g., wheelchair, bedside commode, etc,.)?: None Help needed moving to and from a bed to chair (including a wheelchair)?: A Little Help needed walking in hospital room?: A Little Help needed climbing 3-5 steps with a railing? : A Little 6 Click Score: 21    End of Session Equipment Utilized During Treatment: Gait belt Activity Tolerance: Patient tolerated treatment well Patient left: in chair;with call bell/phone within reach;with family/visitor present   PT Visit Diagnosis: Other symptoms and signs involving the nervous system (R29.898)    Time: 1130-1200 PT Time Calculation (min) (ACUTE ONLY): 30 min   Charges:   PT Evaluation $PT Eval Moderate Complexity: 1 Mod PT Treatments $Gait Training: 8-22 mins   PT G Codes:             Carlie Corpus B. Afshin Chrystal, PT, DPT [phone]   PT G-Codes **NOT FOR INPATIENT CLASS** Functional Assessment Tool Used: AM-PAC 6 Clicks Basic Mobility Functional Limitation: Mobility: Walking and moving around Mobility: Walking and Moving Around Current Status (X5400): At least 20 percent but less than 40 percent impaired, limited or restricted Mobility: Walking and Moving Around Goal Status [phone]): At least 1 percent but less than 20 percent impaired, limited or restricted    11/14/2016, 6:32 PM

## 2016-11-14 NOTE — ED Provider Notes (Signed)
[place] DEPT Provider Note   CSN: [id number] Arrival date & time: 11/13/16  1541     History   Chief Complaint Chief Complaint  Patient presents with  . Extremity Weakness    HPI Renee Payne is a 66 y.o. female.  HPI  Transfer from [facility] regional emergency room secondary to stroke like symptoms. Not received TPA secondary to her INR being elevated. Patient states that she had acute onset of right-sided facial droop and arm weakness with tingling around 9:15 this morning. At this time the symptoms seemed to resolve. No recent illnesses. No other associated symptoms or modifying factors.  Past Medical History:  Diagnosis Date  . Diabetes mellitus without complication ([place])   . Hyperlipidemia   . Hypertension   . Pulmonary emboli [facility])     Patient Active Problem List   Diagnosis Date Noted  . 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 Leukopenia   . Essential hypertension 11/13/2016  . History of pulmonary embolism 11/13/2016  . Anxiety 11/13/2016  . Right arm weakness 11/13/2016  . Suspected stroke patient last known to be well more than 2 hours ago 11/13/2016  . Diabetes mellitus type II, non insulin dependent ([place]) 11/13/2016  . OSA (obstructive sleep apnea) 11/13/2016  . Suspected cerebrovascular accident (CVA) 11/13/2016    Past Surgical History:  Procedure Laterality Date  . IVC FILTER INSERTION      OB History    Gravida Para Term Preterm AB Living             3   SAB TAB Ectopic Multiple Live Births                   Home Medications    Prior to Admission medications   Medication Sig Start Date End Date Taking? Authorizing Provider  amLODipine (NORVASC) 5 MG tablet Take 5 mg by mouth daily. 09/20/16  Yes [provider]  hydrochlorothiazide (HYDRODIURIL) 12.5 MG tablet Take 12.5 mg by mouth daily. 09/19/16  Yes [provider]  metFORMIN (GLUCOPHAGE) 500 MG tablet Take 500 mg by mouth 2 (two) times daily with a meal.   Yes  [provider]  PARoxetine (PAXIL) 10 MG tablet Take 10 mg by mouth daily.   Yes [provider]  triamcinolone cream (KENALOG) 0.1 % Apply 1 application topically daily as needed for itching. 04/16/15  Yes [provider]  valsartan (DIOVAN) 160 MG tablet Take 160 mg by mouth daily.   Yes [provider]  warfarin (COUMADIN) 5 MG tablet Take 5-7.5 mg by mouth See admin instructions. Take 5mg  on M W F and take 7.5mg  on Tue Thur Sat Sun   Yes [provider]    Family History History reviewed. No pertinent family history.  Social History Social History  Substance Use Topics  . Smoking status: Never Smoker  . Smokeless tobacco: Never Used  . Alcohol use No     Allergies   Ativan [lorazepam]   Review of Systems Review of Systems  All other systems reviewed and are negative.    Physical Exam Updated Vital Signs BP (!) 181/87 (BP Location: Right Arm)   Pulse 80   Temp 99 F (37.2 C) (Oral)   Resp 20   Ht 5' 5.5" (1.664 m)   Wt 82 kg (180 lb 11.2 oz)   SpO2 99%   BMI 29.61 kg/m   Physical Exam  Constitutional: She is oriented to person, place, and time. She appears well-developed and well-nourished.  HENT:  Head: Normocephalic and atraumatic.  Eyes: Conjunctivae and EOM are normal.  Neck: Normal range of motion.  Cardiovascular: Normal rate and regular rhythm.   Pulmonary/Chest: No stridor. No respiratory distress.  Abdominal: Soft. She exhibits no distension.  Musculoskeletal: Normal range of motion. She exhibits no edema or deformity.  Neurological: She is alert and oriented to person, place, and time. No cranial nerve deficit. Coordination normal.  4/5 RUE strength  Skin: Skin is warm and dry.  Nursing note and vitals reviewed.    ED Treatments / Results  Labs (all labs ordered are listed, but only abnormal results are displayed) Labs Reviewed  PROTIME-INR - Abnormal; Notable for the following:       Result  Value   Prothrombin Time 28.1 (*)    All other components within normal limits  GLUCOSE, CAPILLARY  URINALYSIS, ROUTINE W REFLEX MICROSCOPIC  HEMOGLOBIN A1C  LIPID PANEL  CBC  BASIC METABOLIC PANEL  VITAMIN B26  FOLATE  IRON AND TIBC  FERRITIN  RETICULOCYTES  PROTIME-INR    EKG  EKG Interpretation None       Radiology Dg Chest 2 View  Result Date: 11/13/2016 CLINICAL DATA:  Right arm weakness and right-sided facial droop. EXAM: CHEST  2 VIEW COMPARISON:  Radiographs of May 18, 2015. FINDINGS: Stable cardiomegaly. Hypoinflation of the lungs is noted. No acute pulmonary disease is noted. No pneumothorax or pleural effusion is noted. Bony thorax is unremarkable. IMPRESSION: Hypoinflation of the lungs.  No other abnormality seen in the chest. Electronically Signed   By: Marijo Conception, M.D.   On: 11/13/2016 21:10    Procedures Procedures (including critical care time)  Medications Ordered in ED Medications  PARoxetine (PAXIL) tablet 10 mg (not administered)   stroke: mapping our early stages of recovery book (not administered)  0.9 %  sodium chloride infusion ( Intravenous Restarted 11/13/16 2346)  acetaminophen (TYLENOL) tablet 650 mg (not administered)    Or  acetaminophen (TYLENOL) solution 650 mg (not administered)    Or  acetaminophen (TYLENOL) suppository 650 mg (not administered)  senna-docusate (Senokot-S) tablet 1 tablet (not administered)  aspirin suppository 300 mg ( Rectal See Alternative 11/13/16 2341)    Or  aspirin tablet 325 mg (325 mg Oral Given 11/13/16 2341)  insulin aspart (novoLOG) injection 0-9 Units (not administered)     Initial Impression / Assessment and Plan / ED Course  I have reviewed the triage vital signs and the nursing notes.  Pertinent labs & imaging results that were available during my care of the patient were reviewed by me and considered in my medical decision making (see chart for details).   Resolving RUE weakness, no TPA,  consulted medicine and neurology.   Final Clinical Impressions(s) / ED Diagnoses   Final diagnoses:  Stroke (cerebrum) ([place])  Leukopenia  Suspected cerebrovascular accident (CVA)      Jemarcus Dougal, Corene Cornea, MD 11/15/16 [phone]

## 2016-11-15 ENCOUNTER — Observation Stay (HOSPITAL_COMMUNITY): Payer: Medicare HMO

## 2016-11-15 DIAGNOSIS — R0989 Other specified symptoms and signs involving the circulatory and respiratory systems: Secondary | ICD-10-CM | POA: Diagnosis not present

## 2016-11-15 DIAGNOSIS — D72819 Decreased white blood cell count, unspecified: Secondary | ICD-10-CM

## 2016-11-15 DIAGNOSIS — E119 Type 2 diabetes mellitus without complications: Secondary | ICD-10-CM | POA: Diagnosis not present

## 2016-11-15 DIAGNOSIS — G4733 Obstructive sleep apnea (adult) (pediatric): Secondary | ICD-10-CM | POA: Diagnosis not present

## 2016-11-15 DIAGNOSIS — I63312 Cerebral infarction due to thrombosis of left middle cerebral artery: Secondary | ICD-10-CM | POA: Diagnosis not present

## 2016-11-15 DIAGNOSIS — I10 Essential (primary) hypertension: Secondary | ICD-10-CM | POA: Diagnosis not present

## 2016-11-15 LAB — GLUCOSE, CAPILLARY
GLUCOSE-CAPILLARY: 132 mg/dL — AB (ref 65–99)
Glucose-Capillary: 105 mg/dL — ABNORMAL HIGH (ref 65–99)

## 2016-11-15 LAB — HEMOGLOBIN A1C
HEMOGLOBIN A1C: 6.8 % — AB (ref 4.8–5.6)
MEAN PLASMA GLUCOSE: 148 mg/dL

## 2016-11-15 LAB — PROTIME-INR
INR: 2.94
PROTHROMBIN TIME: 31.3 s — AB (ref 11.4–15.2)

## 2016-11-15 MED ORDER — UNABLE TO FIND: 0 refills | Status: DC

## 2016-11-15 MED ORDER — ASPIRIN 81 MG PO TBEC: 81.0000 mg | DELAYED_RELEASE_TABLET | Freq: Every day | ORAL | Status: DC

## 2016-11-15 MED ORDER — VITAMIN B-12 1000 MCG PO TABS: 1000.0000 ug | ORAL_TABLET | Freq: Every day | ORAL | 0 refills | Status: DC

## 2016-11-15 NOTE — Care Management Note (Signed)
Case Management Note  Patient Details  Name: SATINE HAUSNER MRN: [id number] Date of Birth: 29-Jul-1950  Subjective/Objective:                    Action/Plan: Pt with recommendations for outpatient OT. MD did prescription for the outpatient therapy. CM met with the patient and spoke to her daughter over the phone. They are going to research and find a rehab convenient for the patient.  Patient states she has transportation home.   Expected Discharge Date:  11/15/16               Expected Discharge Plan:  OP Rehab  In-House Referral:     Discharge planning Services  CM Consult  Post Acute Care Choice:    Choice offered to:     DME Arranged:    DME Agency:     HH Arranged:    HH Agency:     Status of Service:  Completed, signed off  If discussed at [employer] of Stay Meetings, dates discussed:    Additional Comments:  Pollie Friar, RN 11/15/2016, 12:25 PM

## 2016-11-15 NOTE — Discharge Summary (Addendum)
Physician Discharge Summary  Renee Payne GNF:[id number] DOB: 19-Apr-1950 DOA: 11/13/2016  PCP: Renee Payne., MD  Admit date: 11/13/2016 Discharge date: 11/15/2016   Recommendations for Outpatient Follow-Up:   1. Follow up thyroid nodule 2. Follow up CBC for leukopenia 3. Outpatient OT   Discharge Diagnosis:   Principal Problem:   Suspected stroke patient last known to be well more than 2 hours ago Active Problems:   Essential hypertension   History of pulmonary embolism   Anxiety   Right arm weakness   Diabetes mellitus type II, non insulin dependent (HCC)   OSA (obstructive sleep apnea)   Suspected cerebrovascular accident (CVA)   Thyroid incidentaloma   Leukopenia   Discharge disposition:  Home.  Discharge Condition: Improved.  Diet recommendation: Low sodium, heart healthy.  Carbohydrate-modified  Wound care: None.   History of Present Illness:   Renee Payne a 66 y.o.femalewith medical history significant for hypertension, hyperlipidemia, type 2 diabetes mellitus, and history of PE on Coumadin, now presenting to the emergency department for evaluation of right-sided weakness. Patient reports that she had been in her usual state until this morning when she woke with weakness involving her right side and possible right-sided facial droop. Onset was approximately 9 AM and she was brought in to the ED at [facility] for evaluation. She denied any recent fall or trauma, denies any similar symptoms previously, and denies any fevers, chills, chest pain, or palpitations.  In the outside ED, she had a normal chemistry panel and CBC with leukopenia, WBC 2700, and a stable normocytic anemia with hemoglobin of 12.1. Troponin was undetectable and INR was therapeutic at 2.81. She had EKG performed, demonstrating a normal sinus rhythm with LVH. Neurology was consulted by the ED physician, tPA was not given secondary to the elevated INR. She  underwent CTA head and neck with no significant stenosis and no definite acute CVA. Incidental notation is made on the CTA of the thyroid nodule with recommendation to consider FNA.  Patient was transported to the emergency department to [facility] at the family's request. She has remained hemodynamically stable, in no apparent respiratory distress, reports continued improvement in her deficits, noting that she is almost back to baseline, and she will be observed on the telemetry unit for ongoing evaluation and management of a suspected acute stroke.   Hospital Course by Problem:   Right-sided weakness, suspected CVA due to small vessel disease - Pt presented to the ED at [facility] with ~2 hours of right-sided weakness and slurred speech; reports that slurred speech was already improving  - She was not given tPA in light of elevated INR  - She had CTA head and neck with no significant stenosis, no definite acute CVA, and incidental thyroid nodule  - She was transferred to [facility] ED at family's request  Unable to do MRI due to claustrophobia  - Neurology: repeat CT scan does not show any abnormalities  Hx of PE  - Pt reports hx of PE, for which she takes coumadin  - INR was therapeutic at the outside hospital, 2.81   Anxiety  - Continue Paxil   Hypertension -resume home meds  Type II DM  - No A1c on file  - Managed at home with metformin only   Leukopenia  - WBC was 2,700 at [facility], previously normal  - outpatient follow up  OSA - Continue CPAP qHS   Thyroid nodule  - Noted  incidentally on CTA head and neck from outside hospital - Follow-up with PCP recommended for consideration of FNA     Medical Consultants:    Neuro   Discharge Exam:   Vitals:   11/15/16 0005 11/15/16 0506  BP: (!) 151/88 123/69  Pulse: 92 82  Resp: 20 20  Temp: 98.2 F (36.8 C) 97.7 F (36.5 C)   Vitals:   11/14/16 1322 11/14/16 2056 11/15/16  0005 11/15/16 0506  BP: (!) 158/109 (!) 167/95 (!) 151/88 123/69  Pulse: 88 78 92 82  Resp: 16 18 20 20   Temp: 99.1 F (37.3 C) 98.9 F (37.2 C) 98.2 F (36.8 C) 97.7 F (36.5 C)  TempSrc: Oral Oral Oral Oral  SpO2: 97% 96% 97% 97%  Weight:      Height:        Gen:  NAD    The results of significant diagnostics from this hospitalization (including imaging, microbiology, ancillary and laboratory) are listed below for reference.     Procedures and Diagnostic Studies:   Dg Chest 2 View  Result Date: 11/13/2016 CLINICAL DATA:  Right arm weakness and right-sided facial droop. EXAM: CHEST  2 VIEW COMPARISON:  Radiographs of May 18, 2015. FINDINGS: Stable cardiomegaly. Hypoinflation of the lungs is noted. No acute pulmonary disease is noted. No pneumothorax or pleural effusion is noted. Bony thorax is unremarkable. IMPRESSION: Hypoinflation of the lungs.  No other abnormality seen in the chest. Electronically Signed   By: Marijo Conception, M.D.   On: 11/13/2016 21:10     Labs:   Basic Metabolic Panel:  Recent Labs Lab 11/14/16 0353  NA 143  K 3.7  CL 108  CO2 28  GLUCOSE 94  BUN 13  CREATININE 0.96  CALCIUM 9.2   GFR Estimated Creatinine Clearance: 59.9 mL/min (by C-G formula based on SCr of 0.96 mg/dL). Liver Function Tests: No results for input(s): AST, ALT, ALKPHOS, BILITOT, PROT, ALBUMIN in the last 168 hours. No results for input(s): LIPASE, AMYLASE in the last 168 hours. No results for input(s): AMMONIA in the last 168 hours. Coagulation profile  Recent Labs Lab 11/13/16 2109 11/14/16 0353 11/15/16 0252  INR 2.57 2.60 2.94    CBC:  Recent Labs Lab 11/14/16 0353  WBC 3.1*  HGB 11.6*  HCT 36.3  MCV 88.3  PLT 250   Cardiac Enzymes: No results for input(s): CKTOTAL, CKMB, CKMBINDEX, TROPONINI in the last 168 hours. BNP: Invalid input(s): POCBNP CBG:  Recent Labs Lab 11/14/16 1143 11/14/16 1609 11/14/16 1959 11/15/16 0002 11/15/16 0643   GLUCAP 98 114* 140* 134* 105*   D-Dimer No results for input(s): DDIMER in the last 72 hours. Hgb A1c  Recent Labs  11/14/16 0353  HGBA1C 6.8*   Lipid Profile  Recent Labs  11/14/16 0353  CHOL 254*  HDL 42  LDLCALC 190*  TRIG 112  CHOLHDL 6.0   Thyroid function studies No results for input(s): TSH, T4TOTAL, T3FREE, THYROIDAB in the last 72 hours.  Invalid input(s): FREET3 Anemia work up  Recent Labs  11/14/16 0353  VITAMINB12 297  FOLATE 9.9  FERRITIN 86  TIBC 287  IRON 58  RETICCTPCT 1.6   Microbiology No results found for this or any previous visit (from the past 240 hour(s)).   Discharge Instructions:   Discharge Instructions    Ambulatory referral to Neurology    Complete by:  As directed    An appointment is requested in approximately: 6 weeks   Diet - low sodium  heart healthy    Complete by:  As directed    Diet Carb Modified    Complete by:  As directed    Increase activity slowly    Complete by:  As directed      Allergies as of 11/15/2016      Reactions   Ativan [lorazepam] Other (See Comments)   COMBATIVE AND EXTREMELY VIOLENT      Medication List    TAKE these medications   amLODipine 5 MG tablet Commonly known as:  NORVASC Take 5 mg by mouth daily.   aspirin 81 MG EC tablet Take 1 tablet (81 mg total) by mouth daily.   hydrochlorothiazide 12.5 MG tablet Commonly known as:  HYDRODIURIL Take 12.5 mg by mouth daily.   metFORMIN 500 MG tablet Commonly known as:  GLUCOPHAGE Take 500 mg by mouth 2 (two) times daily with a meal.   PARoxetine 10 MG tablet Commonly known as:  PAXIL Take 10 mg by mouth daily.   triamcinolone cream 0.1 % Commonly known as:  KENALOG Apply 1 application topically daily as needed for itching.   UNABLE TO FIND Outpatient OT Dx: CVA eval and treat   valsartan 160 MG tablet Commonly known as:  DIOVAN Take 160 mg by mouth daily.   vitamin B-12 1000 MCG tablet Commonly known as:   CYANOCOBALAMIN Take 1 tablet (1,000 mcg total) by mouth daily.   warfarin 5 MG tablet Commonly known as:  COUMADIN Take 5-7.5 mg by mouth See admin instructions. Take 5mg  on M W F and take 7.5mg  on Tue Thur Sat Sun      Follow-up Information    Renee Payne., MD Follow up in 1 week(s).   Specialty:  Internal Medicine Contact information: [address] [place] [address] [phone]            Time coordinating discharge: 35 min  Signed:  Geradine Girt   [facility] 11/15/2016, 9:35 AM

## 2016-11-15 NOTE — Progress Notes (Signed)
ANTICOAGULATION CONSULT NOTE - follow up Pharmacy Consult for  Coumadin  Indication: history of PE  Allergies  Allergen Reactions  . Ativan [Lorazepam] Other (See Comments)    COMBATIVE AND EXTREMELY VIOLENT    Patient Measurements: Height: 5' 5.5" (166.4 cm) Weight: 180 lb 11.2 oz (82 kg) IBW/kg (Calculated) : 58.15  Vital Signs: Temp: 98.3 F (36.8 C) (07/31 1030) Temp Source: Oral (07/31 1030) BP: 136/67 (07/31 1030) Pulse Rate: 69 (07/31 1030)  Labs:  Recent Labs  11/13/16 2109 11/14/16 0353 11/15/16 0252  HGB  --  11.6*  --   HCT  --  36.3  --   PLT  --  250  --   LABPROT 28.1* 28.4* 31.3*  INR 2.57 2.60 2.94  CREATININE  --  0.96  --     Estimated Creatinine Clearance: 59.9 mL/min (by C-G formula based on SCr of 0.96 mg/dL).   Medical History: Past Medical History:  Diagnosis Date  . Diabetes mellitus without complication ([facility])   . Hyperlipidemia   . Hypertension   . Pulmonary emboli (HCC)     Medications:  Prescriptions Prior to Admission  Medication Sig Dispense Refill Last Dose  . amLODipine (NORVASC) 5 MG tablet Take 5 mg by mouth daily.   11/12/2016 at Unknown time  . hydrochlorothiazide (HYDRODIURIL) 12.5 MG tablet Take 12.5 mg by mouth daily.   11/12/2016 at Unknown time  . metFORMIN (GLUCOPHAGE) 500 MG tablet Take 500 mg by mouth 2 (two) times daily with a meal.   11/12/2016 at Unknown time  . PARoxetine (PAXIL) 10 MG tablet Take 10 mg by mouth daily.   11/12/2016 at Unknown time  . triamcinolone cream (KENALOG) 0.1 % Apply 1 application topically daily as needed for itching.   11/12/2016 at Unknown time  . valsartan (DIOVAN) 160 MG tablet Take 160 mg by mouth daily.   11/12/2016 at Unknown time  . warfarin (COUMADIN) 5 MG tablet Take 5-7.5 mg by mouth See admin instructions. Take 5mg  on M W F and take 7.5mg  on Tue Thur Sat Sun   11/12/2016 at pm   Scheduled:  .  stroke: mapping our early stages of recovery book   Does not apply Once  . amLODipine   5 mg Oral Daily  . aspirin EC  81 mg Oral Daily  . insulin aspart  0-15 Units Subcutaneous TID WC  . insulin aspart  0-5 Units Subcutaneous QHS  . PARoxetine  10 mg Oral Daily  . vitamin B-12  500 mcg Oral Daily  . Warfarin - Pharmacist Dosing Inpatient   Does not apply q1800    Assessment: 66 yo female here with CVA vs TIA. He is on coumadin PTA for history of PE.  Pharmacy consulted to dose coumadin for h/o PE.   Neuro previously recommended an MRI with further plans based on results but unable to do MRI due to claustrophobia. Plan to repat CT head scan today. Today the INR= 2.94 ,  No dose given on 7/29 and 2.5mg  given on 7/30.  INR therapeutic but trending up. Targeting lower end of therapeutic goal 2-2.5 No bleeding reported.  Patient is A&O x 4 per RN assessment.    Home coumadin dose: 2.5mg /day except 5mg  on MWF (last clinic visit 10/04/2016; INR= 2.8) -last dose taken 7/28   Goal of Therapy:  INR 2-3  (2-2.5; target lower end of goal per Dr. Eliseo Squires 7/30) Monitor platelets by anticoagulation protocol: Yes   Plan:  -No coumadin tonight (lower  dose/ target lower end of goal as discussed with Dr. Eliseo Squires) --PT/INR in am   Nicole Cella, RPh Clinical Pharmacist Pager: [phone] 8A-4P [phone] 4P-10P [place] [phone]  11/15/2016 12:46 PM

## 2016-11-15 NOTE — Progress Notes (Signed)
Occupational Therapy Treatment Patient Details Name: Renee Payne MRN: [id number] DOB: 12-28-1950 Today's Date: 11/15/2016    History of present illness 66 y.o. female with medical history significant for hypertension, hyperlipidemia, type 2 diabetes mellitus, and history of PE on Coumadin, now presenting to the emergency department for evaluation of right-sided weakness. Transferred from [facility] hospital to [facility] with suspected CVA.  Stroke workup pending.    OT comments  Pt seen x 2 to modify RW to increase safety with ambulation and educate pt on RUE HEP, compensatory techniques for ADL and home safety to reduce risk of falls. Pt able to return demonstrate. Pt will benefit from continued OT in the outpt setting.   Follow Up Recommendations  Outpatient OT;Supervision - Intermittent    Equipment Recommendations  None recommended by OT    Recommendations for Other Services      Precautions / Restrictions Precautions Precautions: None       Mobility Bed Mobility                  Transfers Overall transfer level: Modified independent                    Balance Overall balance assessment: No apparent balance deficits (not formally assessed)                                         ADL either performed or assessed with clinical judgement   ADL                                         General ADL Comments: Pt overall set up with ADL tasks. Issued red tubing to use with utensils and toothbrush. RW adapted with blue tubing around handle and betapile to increase safety with ambulation as pt's R hand continued to fall off RW when ambulating. Pt practiced multiple times donning/doffing strap and able to demonstrate independence  Recommend pt use shower chair for bathing     Vision       Perception     Praxis      Cognition Arousal/Alertness: Awake/alert Behavior During Therapy: WFL for tasks assessed/performed Overall  Cognitive Status: Within Functional Limits for tasks assessed                                          Exercises Other Exercises Other Exercises: Pt educated on theraputty and fine motor/coordination exercises. Pt using soft tan. Pt verbalized understanding.  Other Exercises: Educated on PNF diagonals. Pt able to return demosntrate   Shoulder Instructions       General Comments      Pertinent Vitals/ Pain       Pain Assessment: No/denies pain  Home Living                                          Prior Functioning/Environment              Frequency  Min 3X/week        Progress Toward Goals  OT Goals(current goals can now be found in the  care plan section)  Progress towards OT goals: Goals met/education completed, patient discharged from OT (Pt to follow up with outpt OT)  Acute Rehab OT Goals Patient Stated Goal: to get back her right hand strength OT Goal Formulation: With patient Time For Goal Achievement: 11/28/16 Potential to Achieve Goals: Good ADL Goals Pt Will Perform Upper Body Dressing: with modified independence;sitting Pt Will Perform Lower Body Dressing: with modified independence;sit to/from stand Pt Will Perform Toileting - Clothing Manipulation and hygiene: with modified independence;sit to/from stand Pt/caregiver will Perform Home Exercise Program: Increased strength;Right Upper extremity;Independently;With written HEP provided  Plan Discharge plan remains appropriate    Co-evaluation                 AM-PAC PT "6 Clicks" Daily Activity     Outcome Measure   Help from another person eating meals?: None Help from another person taking care of personal grooming?: None Help from another person toileting, which includes using toliet, bedpan, or urinal?: None Help from another person bathing (including washing, rinsing, drying)?: None Help from another person to put on and taking off regular upper body  clothing?: None Help from another person to put on and taking off regular lower body clothing?: None 6 Click Score: 24    End of Session    OT Visit Diagnosis: Muscle weakness (generalized) (M62.81)   Activity Tolerance Patient tolerated treatment well   Patient Left with call bell/phone within reach;in bed;Other (comment) (sitting EOB)   Nurse Communication Mobility status    Functional Assessment Tool Used: Clinical judgement Functional Limitation: Self care Self Care Current Status (J5009): At least 1 percent but less than 20 percent impaired, limited or restricted Self Care Goal Status (F8182): At least 1 percent but less than 20 percent impaired, limited or restricted Self Care Discharge Status [phone]): At least 1 percent but less than 20 percent impaired, limited or restricted   Time: 1012-1055 OT Time Calculation (min): 43 min  Charges: OT G-codes **NOT FOR INPATIENT CLASS** Functional Assessment Tool Used: Clinical judgement Functional Limitation: Self care Self Care Current Status (I9678): At least 1 percent but less than 20 percent impaired, limited or restricted Self Care Goal Status (L3810): At least 1 percent but less than 20 percent impaired, limited or restricted Self Care Discharge Status [phone]): At least 1 percent but less than 20 percent impaired, limited or restricted OT General Charges $OT Visit: 1 Procedure OT Evaluation $OT Eval Low Complexity:  (2 visits) OT Treatments $Self Care/Home Management : 23-37 mins $Therapeutic Activity: 8-22 mins $Neuromuscular Re-education: 8-22 mins  [facility], OT/L  [phone] 11/15/2016   Renee Payne,Renee Payne 11/15/2016, 11:52 AM

## 2016-11-15 NOTE — Progress Notes (Signed)
Discharge instructions reviewed with patient. RXs given. All questions answered at this time.Transport home by family.   Allante Beane, RN 

## 2016-11-16 DIAGNOSIS — I639 Cerebral infarction, unspecified: Secondary | ICD-10-CM

## 2016-11-16 NOTE — Progress Notes (Signed)
STROKE TEAM PROGRESS NOTE   SUBJECTIVE (INTERVAL HISTORY) No family is at the bedside.  Patient repeat CT no acute changes. On coumadin, INR 2.94 today. Continue on ASA with coumadin.   OBJECTIVE Temp:  [97.7 F (36.5 C)-98.3 F (36.8 C)] 98.3 F (36.8 C) (07/31 1030) Pulse Rate:  [69-82] 69 (07/31 1030) Cardiac Rhythm: Sinus tachycardia (07/31 0700) Resp:  [16-20] 16 (07/31 1030) BP: (123-136)/(67-69) 136/67 (07/31 1030) SpO2:  [96 %-97 %] 96 % (07/31 1030)  CBC:   Recent Labs Lab 11/14/16 0353  WBC 3.1*  HGB 11.6*  HCT 36.3  MCV 88.3  PLT 716    Basic Metabolic Panel:   Recent Labs Lab 11/14/16 0353  NA 143  K 3.7  CL 108  CO2 28  GLUCOSE 94  BUN 13  CREATININE 0.96  CALCIUM 9.2    Lipid Panel:     Component Value Date/Time   CHOL 254 (H) 11/14/2016 0353   TRIG 112 11/14/2016 0353   HDL 42 11/14/2016 0353   CHOLHDL 6.0 11/14/2016 0353   VLDL 22 11/14/2016 0353   LDLCALC 190 (H) 11/14/2016 0353   HgbA1c:  Lab Results  Component Value Date   HGBA1C 6.8 (H) 11/14/2016   Urine Drug Screen: No results found for: LABOPIA, COCAINSCRNUR, LABBENZ, AMPHETMU, THCU, LABBARB  Alcohol Level No results found for: [place] I have personally reviewed the radiological images below and agree with the radiology interpretations.  CT head no acute abnormalities  CTA head and neck - no significant stenosis and no definite acute CVA. Incidental notation is made on the CTA of the thyroid nodule with recommendation to consider FNA.  2D Echo 11/14/2016 Study Conclusions - Left ventricle: The cavity size was normal. Wall thickness was increased in a pattern of mild LVH. Systolic function was normal.  The estimated ejection fraction was in the range of 60% to 65%.  Wall motion was normal; there were no regional wall motion abnormalities. Doppler parameters are consistent with abnormal left ventricular relaxation (grade 1 diastolic dysfunction). - Aortic valve: There  was mild regurgitation. - Mitral valve: Calcified annulus. - Pulmonary arteries: Systolic pressure was mildly increased. PA peak pressure: 39 mm Hg (S). Impressions: - No cardiac source of emboli was indentified.  Repeat CT head  No interval development of acute stroke or hemorrhage identified. Stable CT of head.   PHYSICAL EXAM  Temp:  [97.7 F (36.5 C)-98.3 F (36.8 C)] 98.3 F (36.8 C) (07/31 1030) Pulse Rate:  [69-82] 69 (07/31 1030) Resp:  [16-20] 16 (07/31 1030) BP: (123-136)/(67-69) 136/67 (07/31 1030) SpO2:  [96 %-97 %] 96 % (07/31 1030)  General - Well nourished, well developed, in no apparent distress.  Ophthalmologic - Sharp disc margins OU.   Cardiovascular - Regular rate and rhythm.  Mental Status -  Level of arousal and orientation to time, place, and person were intact. Language including expression, naming, repetition, comprehension was assessed and found intact. Fund of Knowledge was assessed and was intact.  Cranial Nerves II - XII - II - Visual field intact OU. III, IV, VI - Extraocular movements intact. V - Facial sensation intact bilaterally. VII - right facial droop. VIII - Hearing & vestibular intact bilaterally. X - Palate elevates symmetrically. XI - Chin turning & shoulder shrug intact bilaterally. XII - Tongue protrusion intact.  Motor Strength - The patient's strength was normal in all extremities except right UE 4+/5 and pronator drift was present.  Bulk was normal and fasciculations were  absent.   Motor Tone - Muscle tone was assessed at the neck and appendages and was normal.  Reflexes - The patient's reflexes were 1+ in all extremities and she had no pathological reflexes.  Sensory - Light touch, temperature/pinprick were assessed and were symmetrical.    Coordination - The patient had dysmetria at right FTN proportional to weakness.  Tremor was absent.  Gait and Station - deferred.   ASSESSMENT/PLAN Renee Payne is a  66 y.o. female with history of diabetes, hypertension, hyperlipidemia, pulmonary emboli on Coumadin, who was in her usual state of health until about 9 AM this on the morning of 11/13/2016 when she started noticing right face arm and leg weakness with slurred speech. She did not receive IV t-PA due to being on warfarin.   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 Unlikely cardioembolic due to therapeutic INR.  Resultant  Right facial droop and right arm weakness  CT head no acute abnormalities  CTA head and neck unremarkable  MRI head cancelled due to claustrophobia  CT head repeat no acute findings  2D Echo: EF 60-65%.  No source of embolus.  Mild LVH, grade 1 DD.    TCD bubble study - no window  LDL 190  HgbA1c 6.8  SCDs for VTE prophylaxis Diet - low sodium heart healthy Diet Carb Modified  warfarin daily prior to admission, now on aspirin 325 mg daily. Continue coumadin and add ASA 81mg  for stroke prevention due to small vessel source likely.  Patient counseled to be compliant with her antithrombotic medications  Ongoing aggressive stroke risk factor management  Therapy recommendations:  pending  Disposition:  Pending  PE on coumadin  Ok to continue coumadin from stroke standpoint  TCD bubble study no window  Not sure the trigger for PE  Pt denies any family hx of clotting disorder and no miscarriages in the past  Hypertension  Stable Permissive hypertension (OK if < 220/120) but gradually normalize in 5-7 days Long-term BP goal normotensive  Hyperlipidemia  Home meds: none  LDL 190, goal < 70  Not tolerating statins in the past  Recommend to follow up with PCP to consider PCSK9 inhibitors. Pt agrees  Diabetes  HgbA1c 6.8, goal < 7.0  Controlled  SSI  Other Stroke Risk Factors  Advanced age  Obesity, recommend weight loss,  diet and exercise as appropriate   OSA on CPAP  Other Active Problems  None  Hospital day # 0  Neurology will sign off. Please call with questions. Pt will follow up with Renee Rubin, NP, at [facility] in about 6 weeks. Thanks for the consult.   Rosalin Hawking, MD PhD Stroke Neurology 11/16/2016 12:54 AM    To contact Stroke Continuity provider, please refer to [email or web address] After hours, contact General Neurology

## 2018-08-28 IMAGING — DX DG CHEST 2V
2 series · 2 of 2 positions shown · non-contrast
Comparison: Radiographs May 18, 2015.

CLINICAL DATA: Right arm weakness and right-sided facial droop.

EXAM:
CHEST  2 VIEW

[chest lat]
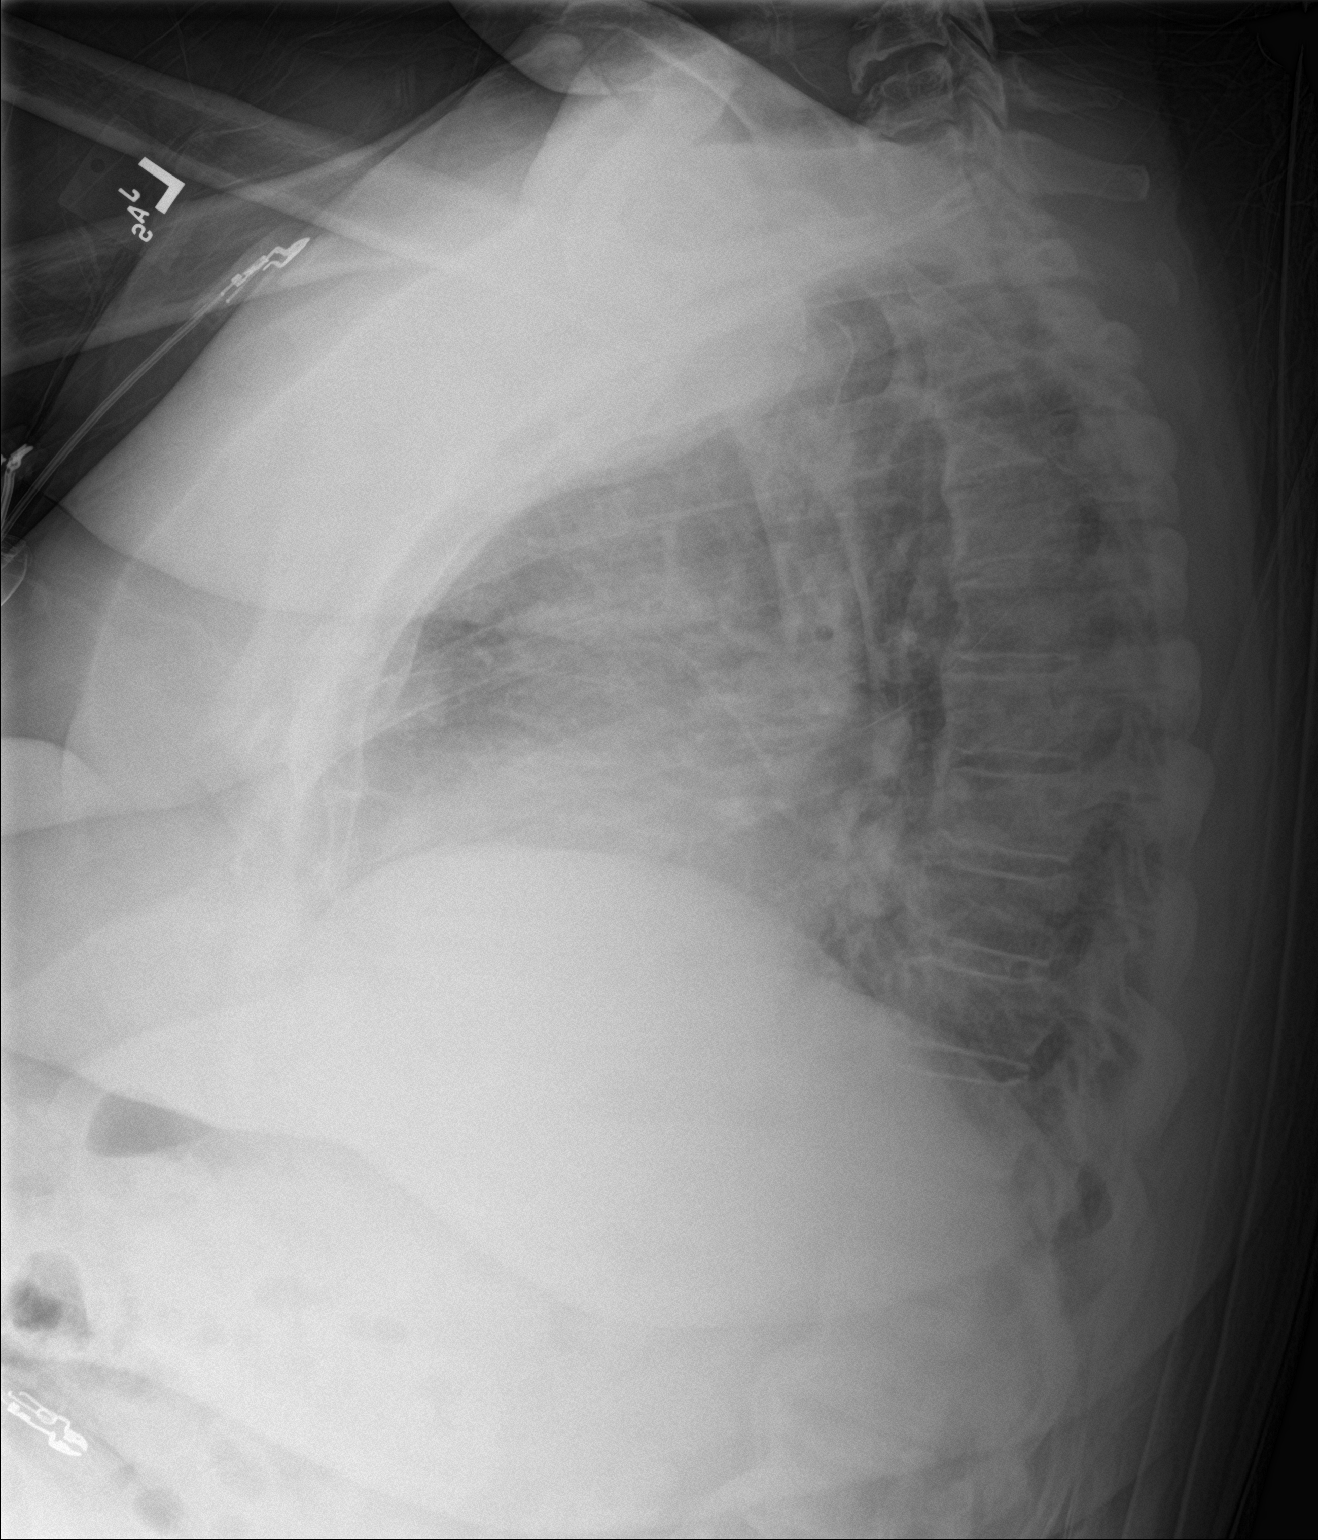

[chest ap]
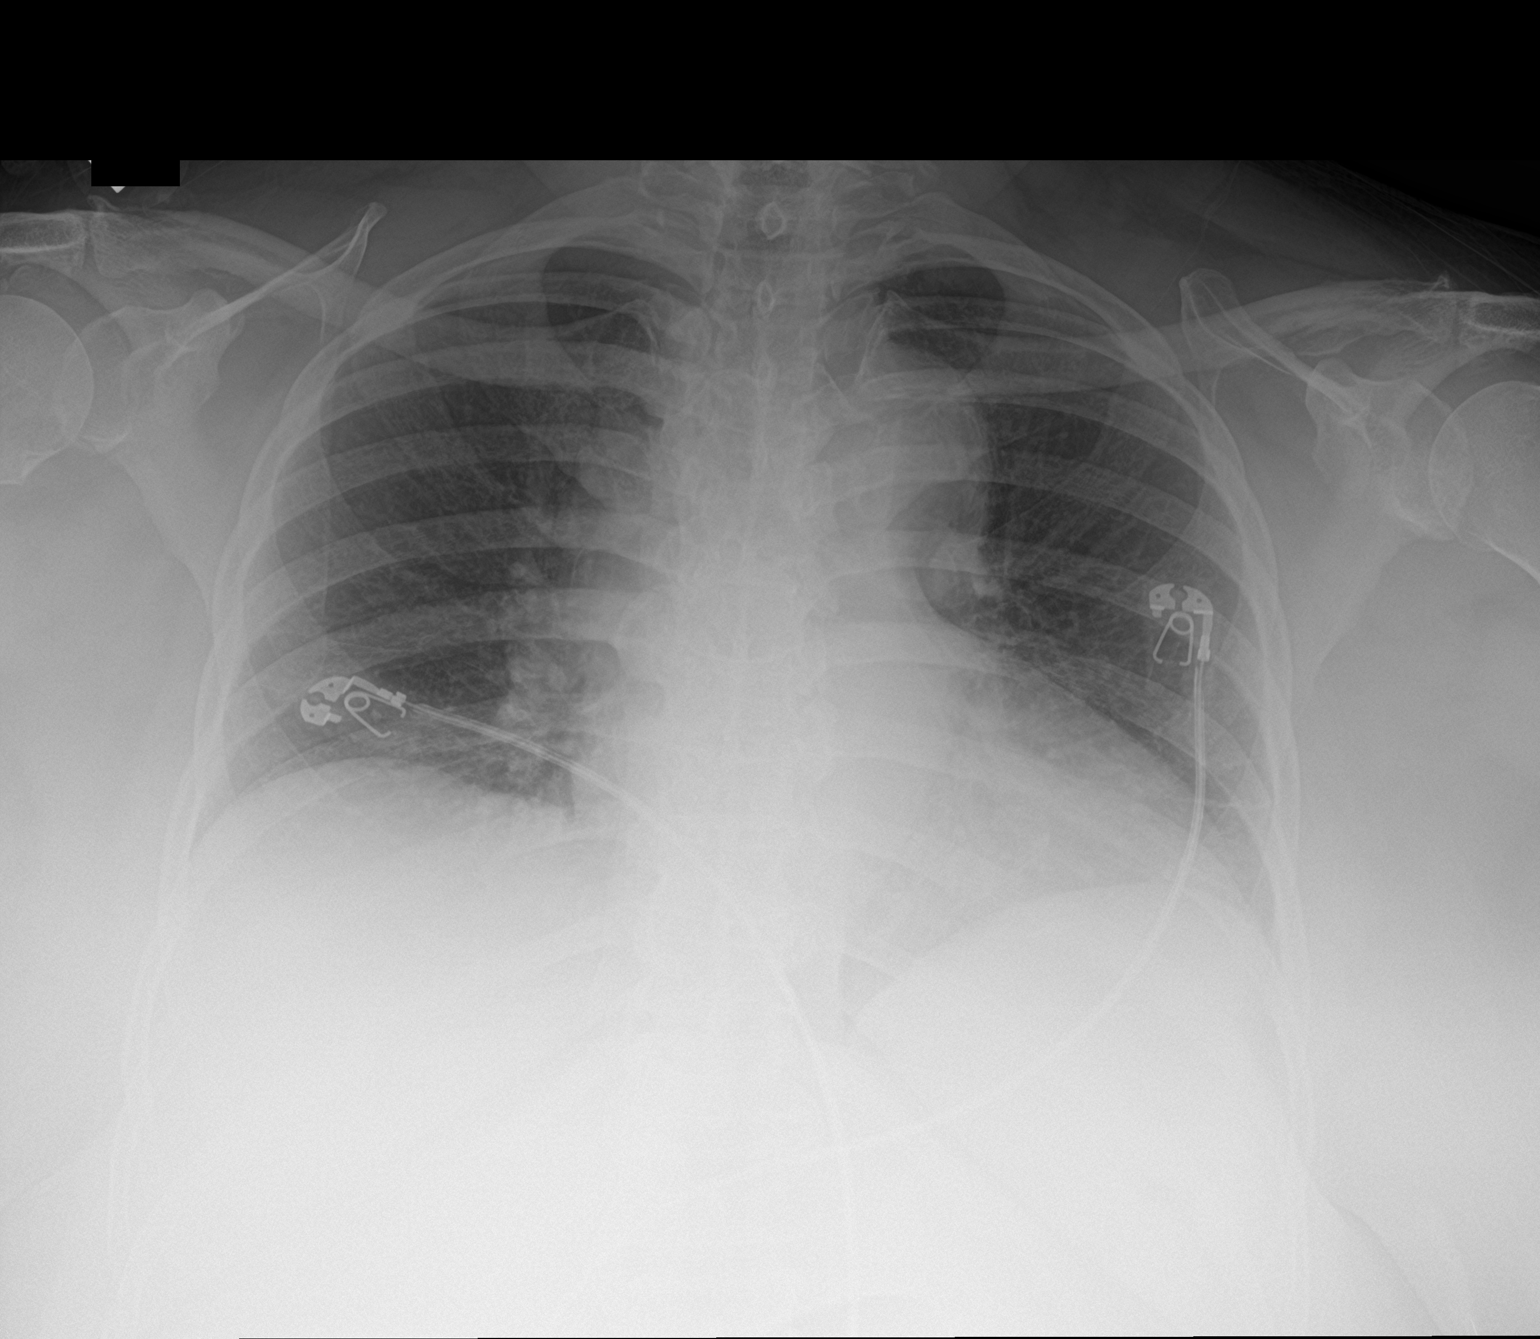

[2 of 2 positions shown; findings below may reference images not displayed]

FINDINGS: Stable cardiomegaly. Hypoinflation of the lungs is noted. No acute
pulmonary disease is noted. No pneumothorax or pleural effusion is
noted. Bony thorax is unremarkable.
IMPRESSION: Hypoinflation of the lungs.  No other abnormality seen in the chest.

## 2018-08-30 IMAGING — CT CT HEAD W/O CM
4 series · 16 of 47 positions shown, 18 images · non-contrast
Comparison: 11/13/2016 CTA of the head.

CLINICAL DATA: 66 y/o  F; cerebrovascular accident.

EXAM:
CT HEAD WITHOUT CONTRAST
TECHNIQUE: Contiguous axial images were obtained from the base of the skull
through the vertex without intravenous contrast.

[Series 3: head without · axial · non-contrast · 0.45mm/px · z∈[-117,-2]mm · 7 of 31 slices shown, 9 images]
[im 4/31  brain]
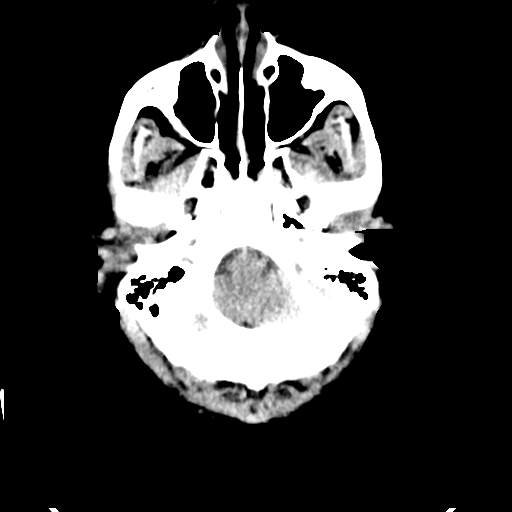
[im 4/31  bone]
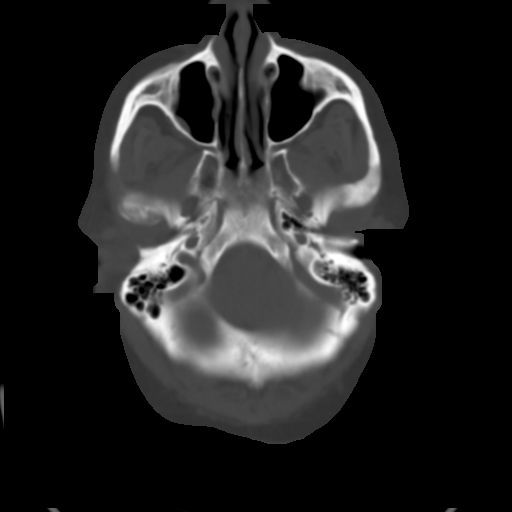
[im 8/31  brain]
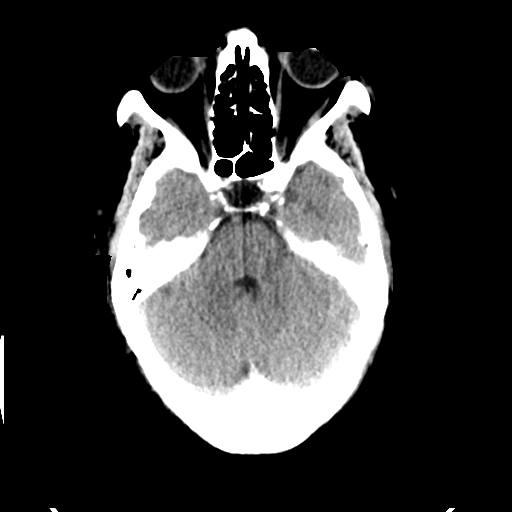
[im 12/31  brain]
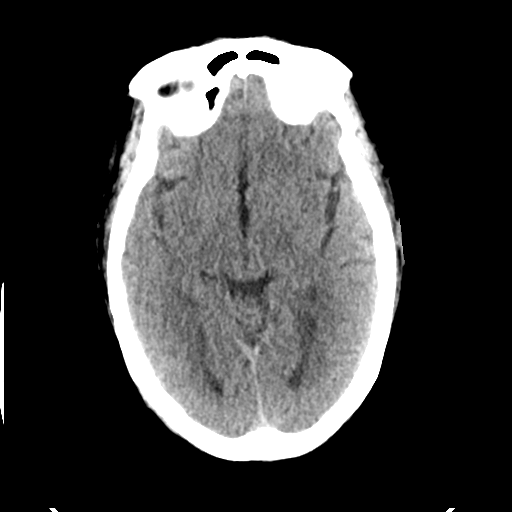
[im 16/31  brain]
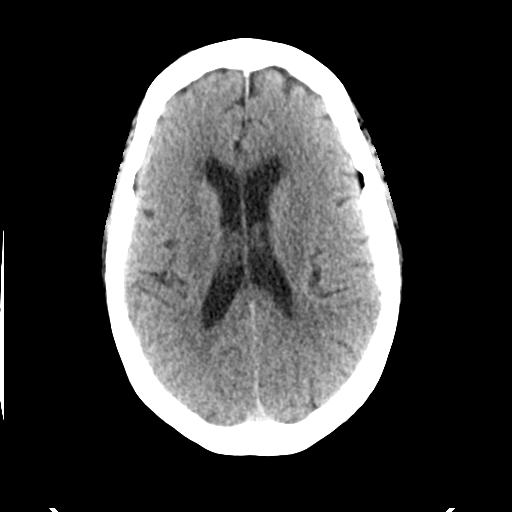
[im 19/31  brain]
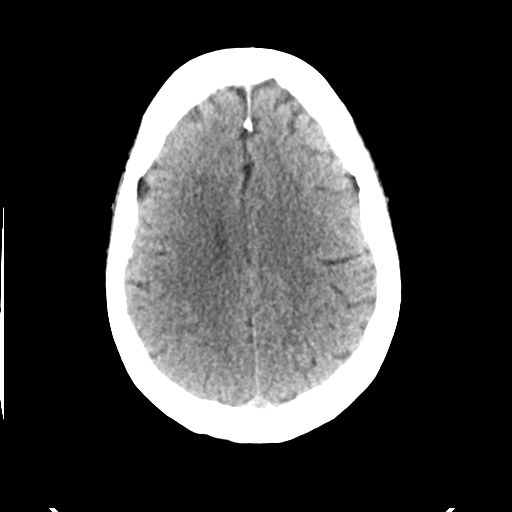
[im 19/31  bone]
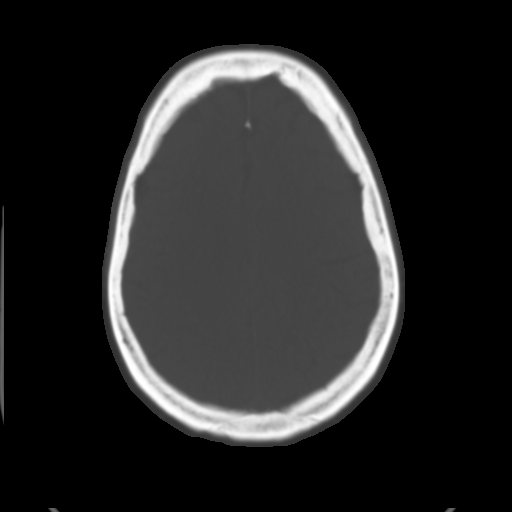
[im 23/31  brain]
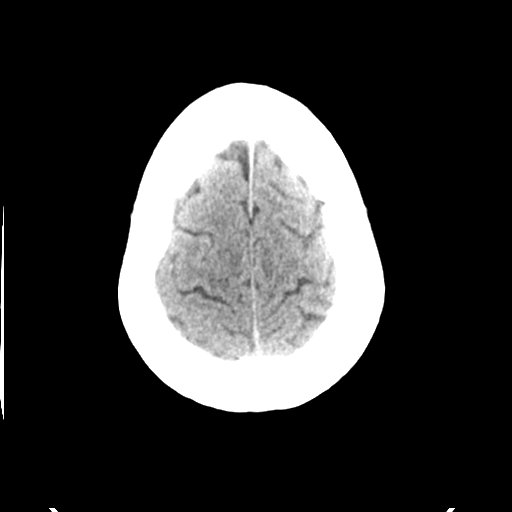
[im 27/31  brain]
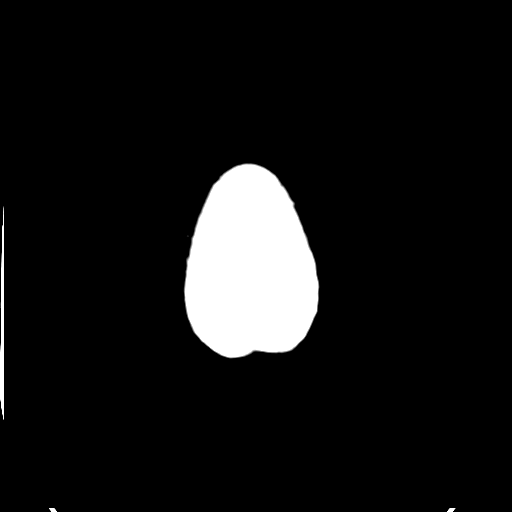

[Series 4: head bone · axial · 0.45mm/px · z∈[-118,-88]mm · 3 of 77 slices shown]
[im 8/77  bone]
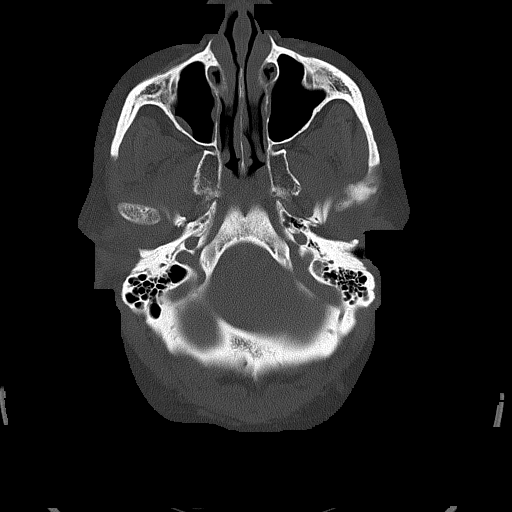
[im 16/77  bone]
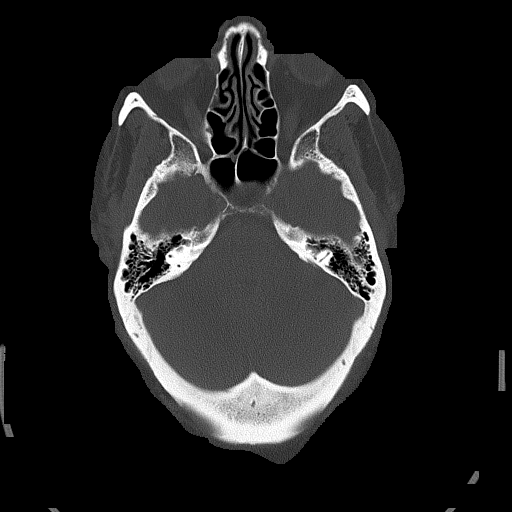
[im 23/77  bone]
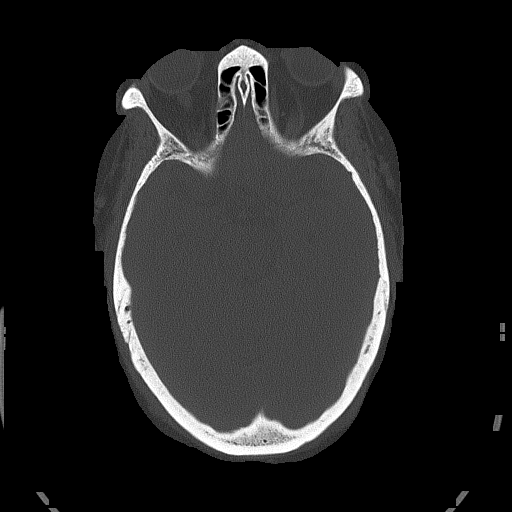

[Series 5: head without cor · coronal · non-contrast · 0.31mm/px · 3 of 71 slices shown]
[im 24/71  brain]
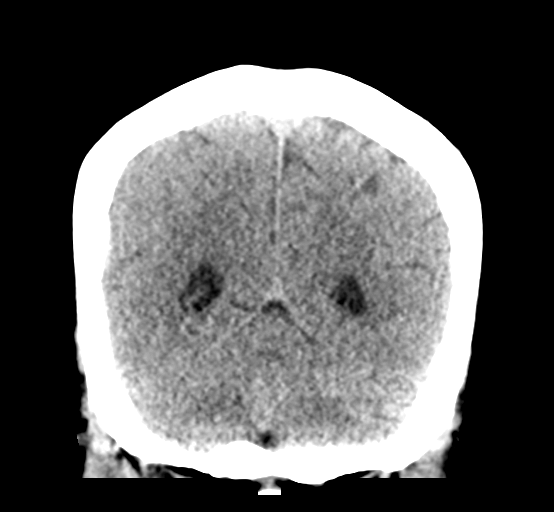
[im 32/71  brain]
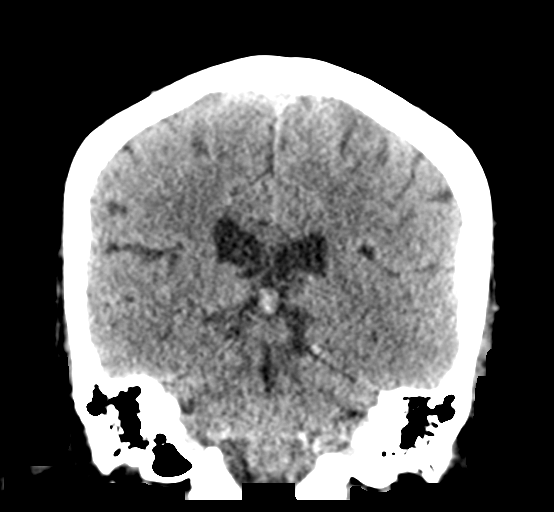
[im 39/71  brain]
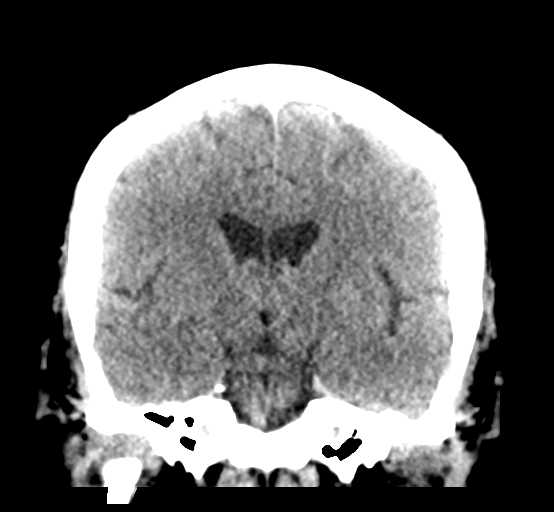

[Series 6: head without sag · sagittal · non-contrast · 0.31mm/px · 3 of 58 slices shown]
[im 20/58  brain]
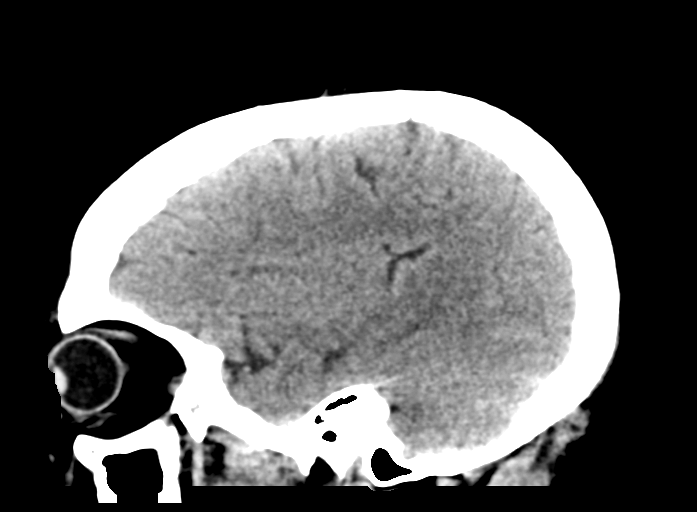
[im 29/58  brain]
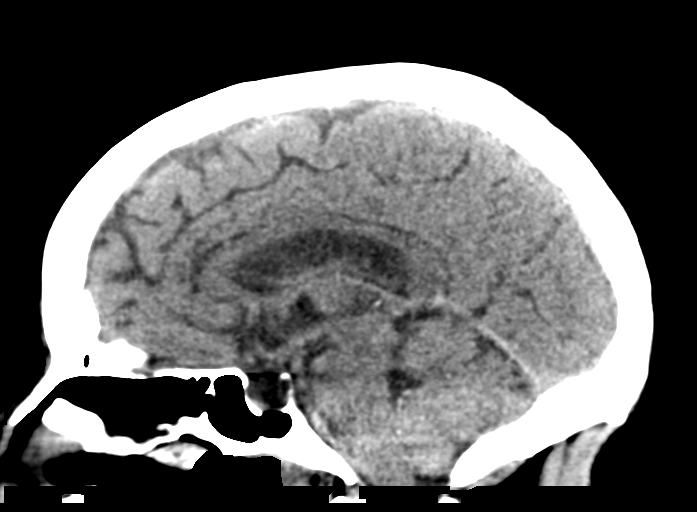
[im 39/58  brain]
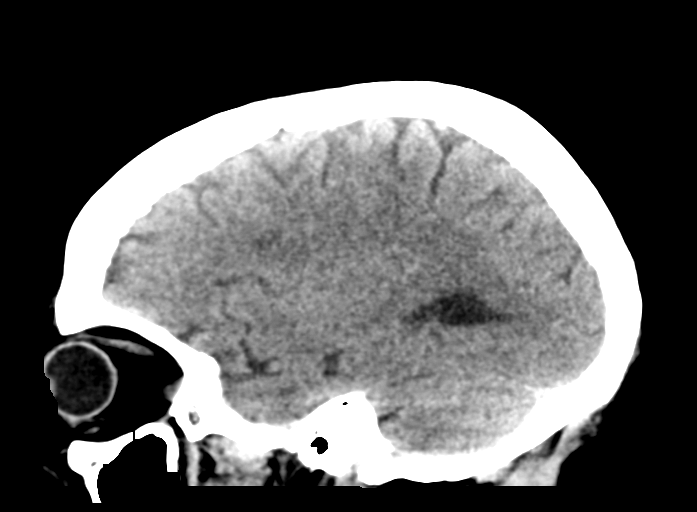

[16 of 47 positions shown; findings below may reference images not displayed]

FINDINGS: Brain: No evidence of acute infarction, hemorrhage, hydrocephalus,
extra-axial collection or mass lesion/mass effect. Stable foci of
hypoattenuation in subcortical white matter compatible with mild
chronic microvascular ischemic changes. Partially empty sella
turcica.

Vascular: New hyperdense vessel identified. Mild calcific
atherosclerosis of carotid siphons.

Skull: Normal. Negative for fracture or focal lesion.

Sinuses/Orbits: Mild right maxillary sinus mucosal thickening.
Normal aeration of mastoid air cells. Normal orbits.

Other: None.
IMPRESSION: No interval development of acute stroke or hemorrhage identified.
Stable CT of head.

By: Kristopher Christen M.D.

## 2019-03-02 ENCOUNTER — Emergency Department (HOSPITAL_COMMUNITY)
Admission: EM | Admit: 2019-03-02 | Discharge: 2019-03-02 | Disposition: A | Discharge status: Home / Self Care | Payer: Medicare HMO | Attending: Emergency Medicine | Admitting: Emergency Medicine

## 2019-03-02 ENCOUNTER — Encounter (HOSPITAL_COMMUNITY): Payer: Self-pay | Admitting: Emergency Medicine

## 2019-03-02 ENCOUNTER — Emergency Department (HOSPITAL_BASED_OUTPATIENT_CLINIC_OR_DEPARTMENT_OTHER): Payer: Medicare HMO

## 2019-03-02 ENCOUNTER — Other Ambulatory Visit: Payer: Self-pay

## 2019-03-02 DIAGNOSIS — Z7982 Long term (current) use of aspirin: Secondary | ICD-10-CM | POA: Insufficient documentation

## 2019-03-02 DIAGNOSIS — M7989 Other specified soft tissue disorders: Secondary | ICD-10-CM

## 2019-03-02 DIAGNOSIS — M79605 Pain in left leg: Secondary | ICD-10-CM | POA: Diagnosis not present

## 2019-03-02 DIAGNOSIS — Z8673 Personal history of transient ischemic attack (TIA), and cerebral infarction without residual deficits: Secondary | ICD-10-CM | POA: Diagnosis not present

## 2019-03-02 DIAGNOSIS — I10 Essential (primary) hypertension: Secondary | ICD-10-CM | POA: Diagnosis not present

## 2019-03-02 DIAGNOSIS — E119 Type 2 diabetes mellitus without complications: Secondary | ICD-10-CM | POA: Diagnosis not present

## 2019-03-02 DIAGNOSIS — Z7901 Long term (current) use of anticoagulants: Secondary | ICD-10-CM | POA: Insufficient documentation

## 2019-03-02 DIAGNOSIS — Z7984 Long term (current) use of oral hypoglycemic drugs: Secondary | ICD-10-CM | POA: Insufficient documentation

## 2019-03-02 LAB — CBC WITH DIFFERENTIAL/PLATELET
Abs Immature Granulocytes: 0.01 10*3/uL (ref 0.00–0.07)
Basophils Absolute: 0.1 10*3/uL (ref 0.0–0.1)
Basophils Relative: 1 %
Eosinophils Absolute: 0.1 10*3/uL (ref 0.0–0.5)
Eosinophils Relative: 1 %
HCT: 35.7 % — ABNORMAL LOW (ref 36.0–46.0)
Hemoglobin: 10.8 g/dL — ABNORMAL LOW (ref 12.0–15.0)
Immature Granulocytes: 0 %
Lymphocytes Relative: 21 %
Lymphs Abs: 1 10*3/uL (ref 0.7–4.0)
MCH: 29.2 pg (ref 26.0–34.0)
MCHC: 30.3 g/dL (ref 30.0–36.0)
MCV: 96.5 fL (ref 80.0–100.0)
Monocytes Absolute: 0.6 10*3/uL (ref 0.1–1.0)
Monocytes Relative: 14 %
Neutro Abs: 2.9 10*3/uL (ref 1.7–7.7)
Neutrophils Relative %: 63 %
Platelets: 243 10*3/uL (ref 150–400)
RBC: 3.7 MIL/uL — ABNORMAL LOW (ref 3.87–5.11)
RDW: 15.4 % (ref 11.5–15.5)
WBC: 4.6 10*3/uL (ref 4.0–10.5)
nRBC: 0 % (ref 0.0–0.2)

## 2019-03-02 LAB — BASIC METABOLIC PANEL
Anion gap: 11 (ref 5–15)
BUN: 17 mg/dL (ref 8–23)
CO2: 24 mmol/L (ref 22–32)
Calcium: 9.2 mg/dL (ref 8.9–10.3)
Chloride: 104 mmol/L (ref 98–111)
Creatinine, Ser: 1.13 mg/dL — ABNORMAL HIGH (ref 0.44–1.00)
GFR calc Af Amer: 57 mL/min — ABNORMAL LOW (ref >60)
GFR calc non Af Amer: 49 mL/min — ABNORMAL LOW (ref >60)
Glucose, Bld: 136 mg/dL — ABNORMAL HIGH (ref 70–99)
Potassium: 4.3 mmol/L (ref 3.5–5.1)
Sodium: 139 mmol/L (ref 135–145)

## 2019-03-02 LAB — PROTIME-INR
INR: 1.9 — ABNORMAL HIGH (ref 0.8–1.2)
Prothrombin Time: 21.4 seconds — ABNORMAL HIGH (ref 11.4–15.2)

## 2019-03-02 MED ORDER — CEPHALEXIN 500 MG PO CAPS: 500.0000 mg | ORAL_CAPSULE | Freq: Four times a day (QID) | ORAL | 0 refills | Status: AC

## 2019-03-02 NOTE — ED Triage Notes (Signed)
C/o left lower leg pain since Monday.  No known injury.  Pt has ACE bandage in place on arrival.

## 2019-03-02 NOTE — ED Provider Notes (Signed)
[place] EMERGENCY DEPARTMENT Provider Note   CSN: [id number] Arrival date & time: 03/02/19  1029     History   Chief Complaint Chief Complaint  Patient presents with  . Leg Pain    HPI Renee Payne is a 68 y.o. female with past medical history significant for diabetes, hyperlipidemia, hypertension, PE on Coumadin, MGUS, neutropenia presents to emergency department today with chief complaint of left leg pain x6 days.  She states the pain is located below her left knee.  Pain is worse with movement and walking.  She rates the pain 10 of 10 in severity.  She did not take any for pain prior to arrival.  She describes the pain as sharp and soreness.  She denies history of DVT.  She is unsure if she has had fever, but thinks she might have been last night ever did not check.  Patient states she spends most of her time in a wheelchair, she is not very mobile at baseline.  She denies chest pain, shortness of breath, palpitations, nausea, vomiting, urinary symptoms, diarrhea, abdominal pain, cough.  No recent long periods of immobilization.  She reports compliance with her Coumadin.  She went to urgent care prior to arrival and was advised to come to the emergency department as she was found to be febrile to 100.4 temporally and slightly tachycardic with worsening edema in her left leg.  Provider was concerned for cellulitis versus DVT.  Past Medical History:  Diagnosis Date  . Diabetes mellitus without complication ([place])   . Hyperlipidemia   . Hypertension   . Pulmonary emboli [facility])     Patient Active Problem List   Diagnosis Date Noted  . CVA (cerebral vascular accident) ([place])   . 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 Leukopenia   . Essential hypertension 11/13/2016  . History of pulmonary embolism 11/13/2016  . Anxiety 11/13/2016  . Right arm weakness 11/13/2016  . Suspected stroke patient last known to be well more than 2 hours ago 11/13/2016  . Diabetes  mellitus type II, non insulin dependent ([place]) 11/13/2016  . OSA (obstructive sleep apnea) 11/13/2016  . Suspected cerebrovascular accident (CVA) 11/13/2016    Past Surgical History:  Procedure Laterality Date  . IVC FILTER INSERTION       OB History    Gravida      Para      Term      Preterm      AB      Living  3     SAB      TAB      Ectopic      Multiple      Live Births               Home Medications    Prior to Admission medications   Medication Sig Start Date End Date Taking? Authorizing Provider  amLODipine (NORVASC) 5 MG tablet Take 5 mg by mouth daily. 09/20/16   [provider]  aspirin EC 81 MG EC tablet Take 1 tablet (81 mg total) by mouth daily. 11/16/16   Geradine Girt, DO  cephALEXin (KEFLEX) 500 MG capsule Take 1 capsule (500 mg total) by mouth 4 (four) times daily for 5 days. 03/02/19 03/07/19  Albrizze, Kaitlyn E, PA-C  hydrochlorothiazide (HYDRODIURIL) 12.5 MG tablet Take 12.5 mg by mouth daily. 09/19/16   [provider]  metFORMIN (GLUCOPHAGE) 500 MG tablet Take 500 mg by mouth 2 (two) times daily with a meal.  [provider]  PARoxetine (PAXIL) 10 MG tablet Take 10 mg by mouth daily.    [provider]  triamcinolone cream (KENALOG) 0.1 % Apply 1 application topically daily as needed for itching. 04/16/15   [provider]  UNABLE TO FIND Outpatient OT Dx: CVA eval and treat 11/15/16   Geradine Girt, DO  valsartan (DIOVAN) 160 MG tablet Take 160 mg by mouth daily.    [provider]  vitamin B-12 (CYANOCOBALAMIN) 1000 MCG tablet Take 1 tablet (1,000 mcg total) by mouth daily. 11/16/16   Geradine Girt, DO  warfarin (COUMADIN) 5 MG tablet Take 5-7.5 mg by mouth See admin instructions. Take 5mg  on M W F and take 7.5mg  on Tue Thur Sat Sun    [provider]    Family History No family history on file.  Social History Social History   Tobacco Use  . Smoking status: Never  Smoker  . Smokeless tobacco: Never Used  Substance Use Topics  . Alcohol use: No  . Drug use: No     Allergies   Ativan [lorazepam]   Review of Systems Review of Systems  Constitutional: Positive for chills and fever. Negative for diaphoresis and fatigue.  HENT: Negative for congestion, ear discharge, ear pain, sinus pressure, sinus pain and sore throat.   Eyes: Negative for pain and redness.  Respiratory: Negative for cough, shortness of breath and wheezing.   Cardiovascular: Positive for leg swelling. Negative for chest pain.  Gastrointestinal: Negative for abdominal pain, constipation, diarrhea, nausea and vomiting.  Genitourinary: Negative for dysuria and hematuria.  Musculoskeletal: Positive for arthralgias. Negative for back pain and neck pain.  Skin: Negative for wound.  Neurological: Negative for weakness, numbness and headaches.     Physical Exam Updated Vital Signs BP 127/81 (BP Location: Right Arm)   Pulse 88   Temp 98.1 F (36.7 C) (Oral)   Resp 16   SpO2 96%   Physical Exam Vitals signs and nursing note reviewed.  Constitutional:      General: She is not in acute distress.    Appearance: She is not ill-appearing.  HENT:     Head: Normocephalic and atraumatic.     Right Ear: Tympanic membrane and external ear normal.     Left Ear: Tympanic membrane and external ear normal.     Nose: Nose normal.     Mouth/Throat:     Mouth: Mucous membranes are moist.     Pharynx: Oropharynx is clear.  Eyes:     General: No scleral icterus.       Right eye: No discharge.        Left eye: No discharge.     Extraocular Movements: Extraocular movements intact.     Conjunctiva/sclera: Conjunctivae normal.     Pupils: Pupils are equal, round, and reactive to light.  Neck:     Musculoskeletal: Normal range of motion.     Vascular: No JVD.  Cardiovascular:     Rate and Rhythm: Normal rate and regular rhythm.     Pulses: Normal pulses.          Radial pulses are 2+ on  the right side and 2+ on the left side.     Heart sounds: Normal heart sounds.  Pulmonary:     Comments: Lungs clear to auscultation in all fields. Symmetric chest rise. No wheezing, rales, or rhonchi. Abdominal:     Comments: Abdomen is soft, non-distended, and non-tender in all quadrants. No rigidity, no  guarding. No peritoneal signs.  Musculoskeletal: Normal range of motion.     Comments: 2+ pretibial pitting edema RLE, 3+ pretibial pitting edema LLE. Left  anterior shin with area of mild erythema and warmth. No open wound or laceration.  Homans sign absent bilaterally, no lower extremity edema, no palpable cords, compartments are soft   Skin:    General: Skin is warm and dry.     Capillary Refill: Capillary refill takes less than 2 seconds.  Neurological:     Mental Status: She is oriented to person, place, and time.     GCS: GCS eye subscore is 4. GCS verbal subscore is 5. GCS motor subscore is 6.     Comments: Fluent speech, no facial droop.  Psychiatric:        Behavior: Behavior normal.      ED Treatments / Results  Labs (all labs ordered are listed, but only abnormal results are displayed) Labs Reviewed  BASIC METABOLIC PANEL - Abnormal; Notable for the following components:      Result Value   Glucose, Bld 136 (*)    Creatinine, Ser 1.13 (*)    GFR calc non Af Amer 49 (*)    GFR calc Af Amer 57 (*)    All other components within normal limits  CBC WITH DIFFERENTIAL/PLATELET - Abnormal; Notable for the following components:   RBC 3.70 (*)    Hemoglobin 10.8 (*)    HCT 35.7 (*)    All other components within normal limits  PROTIME-INR - Abnormal; Notable for the following components:   Prothrombin Time 21.4 (*)    INR 1.9 (*)    All other components within normal limits    EKG None  Radiology No results found.  Procedures Procedures (including critical care time)  Medications Ordered in ED Medications - No data to display   Initial Impression /  Assessment and Plan / ED Course  I have reviewed the triage vital signs and the nursing notes.  Pertinent labs & imaging results that were available during my care of the patient were reviewed by me and considered in my medical decision making (see chart for details).  Patient seen and examined. Patient nontoxic appearing, in no apparent distress.  She is afebrile, normotensive, no tachycardia, no hypoxia.  Lungs are clear to auscultation all fields.  Negative Homans' sign bilaterally.  She has pitting edema in bilateral lower extremities worse on the left. With patient's history of neutropenia will check basic labs and DVT study. CBC today shows white count of 4.6.  BMP shows creatinine of 1.13.  Patient's prior was 2 years ago when it was in normal range.  INR today is 1.9.  No severe electrolyte derangement.  Ultrasound is negative for DVT.  Engaged in shared decision-making with patient in regards to antibiotics.  She has a reassuring work-up today however has been noted to be febrile and has a history of neutropenia, and with mild erythema on leg today.  She feels comfortable with antibiotics for possible cellulitis.  Will send prescription for Keflex to pharmacy.  She was able to ambulate with assistance, which she states is her baseline. The patient appears reasonably screened and/or stabilized for discharge and I doubt any other medical condition or other [facility] requiring further screening, evaluation, or treatment in the ED at this time prior to discharge. The patient is safe for discharge with strict return precautions discussed. Recommend pcp follow up to have INR rechecked as well as to have creatinine  rechecked within 1 week as it was slightly elevated today The patient was discussed with and seen by Dr. Roslynn Amble who agrees with the treatment plan.   Portions of this note were generated with [employer]. Dictation errors may occur despite best attempts at proofreading.    Final  Clinical Impressions(s) / ED Diagnoses   Final diagnoses:  Left leg pain    ED Discharge Orders         Ordered    cephALEXin (KEFLEX) 500 MG capsule  4 times daily     03/02/19 1516           Cherre Robins, PA-C 03/02/19 1526    Lucrezia Starch, MD 03/03/19 [phone]

## 2019-03-02 NOTE — Progress Notes (Signed)
VASCULAR LAB PRELIMINARY  PRELIMINARY  PRELIMINARY  PRELIMINARY  Left lower extremity venous duplex completed.    Preliminary report:  See CV proc for preliminary results.   Torah Pinnock, RVT 03/02/2019, 3:43 PM

## 2019-03-02 NOTE — Discharge Instructions (Addendum)
You have been seen today for leg pain. Please read and follow all provided instructions. Return to the emergency room for worsening condition or new concerning symptoms.    Your INR today was 1.9.  Please follow-up with your regular doctor to have this rechecked within the next week to make sure it is within expected range.  -Also recommend you have your kidney function rechecked within 1 week by primary care provider it was slightly elevated today. 1. Medications:  Prescription has been sent to your pharmacy for Keflex.  This is an antibiotic.  Please take as prescribed.  This is used to treat skin infections. Continue usual home medications Take medications as prescribed. Please review all of the medicines and only take them if you do not have an allergy to them.   2. Treatment: rest, drink plenty of fluids  3. Follow Up: Please follow up with your primary doctor in 2-5 days for discussion of your diagnoses and further evaluation after today's visit; Call today to arrange your follow up.     It is also a possibility that you have an allergic reaction to any of the medicines that you have been prescribed - Everybody reacts differently to medications and while MOST people have no trouble with most medicines, you may have a reaction such as nausea, vomiting, rash, swelling, shortness of breath. If this is the case, please stop taking the medicine immediately and contact your physician.  ?

## 2022-04-18 DIAGNOSIS — Z950 Presence of cardiac pacemaker: Secondary | ICD-10-CM

## 2022-04-18 HISTORY — DX: Presence of cardiac pacemaker: Z95.0

## 2022-06-17 ENCOUNTER — Encounter (HOSPITAL_COMMUNITY): Payer: Self-pay

## 2022-06-17 ENCOUNTER — Emergency Department (HOSPITAL_COMMUNITY): Payer: Medicare HMO

## 2022-06-17 ENCOUNTER — Encounter: Payer: Self-pay | Admitting: *Deleted

## 2022-06-17 ENCOUNTER — Inpatient Hospital Stay (HOSPITAL_COMMUNITY)
Admission: EM | Admit: 2022-06-17 | Discharge: 2022-06-21 | DRG: 243 | Disposition: A | Discharge status: Home / Self Care | Payer: Medicare HMO | Attending: Internal Medicine | Admitting: Internal Medicine

## 2022-06-17 ENCOUNTER — Other Ambulatory Visit: Payer: Self-pay

## 2022-06-17 DIAGNOSIS — Z86711 Personal history of pulmonary embolism: Secondary | ICD-10-CM | POA: Diagnosis not present

## 2022-06-17 DIAGNOSIS — Z8673 Personal history of transient ischemic attack (TIA), and cerebral infarction without residual deficits: Secondary | ICD-10-CM

## 2022-06-17 DIAGNOSIS — Z79899 Other long term (current) drug therapy: Secondary | ICD-10-CM | POA: Diagnosis not present

## 2022-06-17 DIAGNOSIS — E785 Hyperlipidemia, unspecified: Secondary | ICD-10-CM | POA: Diagnosis present

## 2022-06-17 DIAGNOSIS — I129 Hypertensive chronic kidney disease with stage 1 through stage 4 chronic kidney disease, or unspecified chronic kidney disease: Secondary | ICD-10-CM | POA: Diagnosis present

## 2022-06-17 DIAGNOSIS — Z888 Allergy status to other drugs, medicaments and biological substances status: Secondary | ICD-10-CM | POA: Diagnosis not present

## 2022-06-17 DIAGNOSIS — E669 Obesity, unspecified: Secondary | ICD-10-CM | POA: Diagnosis present

## 2022-06-17 DIAGNOSIS — E1122 Type 2 diabetes mellitus with diabetic chronic kidney disease: Secondary | ICD-10-CM | POA: Diagnosis present

## 2022-06-17 DIAGNOSIS — R42 Dizziness and giddiness: Secondary | ICD-10-CM | POA: Diagnosis present

## 2022-06-17 DIAGNOSIS — R001 Bradycardia, unspecified: Secondary | ICD-10-CM | POA: Diagnosis present

## 2022-06-17 DIAGNOSIS — R112 Nausea with vomiting, unspecified: Secondary | ICD-10-CM | POA: Diagnosis present

## 2022-06-17 DIAGNOSIS — Z6833 Body mass index (BMI) 33.0-33.9, adult: Secondary | ICD-10-CM | POA: Diagnosis not present

## 2022-06-17 DIAGNOSIS — Z7982 Long term (current) use of aspirin: Secondary | ICD-10-CM | POA: Diagnosis not present

## 2022-06-17 DIAGNOSIS — Z7901 Long term (current) use of anticoagulants: Secondary | ICD-10-CM | POA: Diagnosis not present

## 2022-06-17 DIAGNOSIS — N179 Acute kidney failure, unspecified: Secondary | ICD-10-CM | POA: Diagnosis present

## 2022-06-17 DIAGNOSIS — I442 Atrioventricular block, complete: Principal | ICD-10-CM | POA: Diagnosis present

## 2022-06-17 DIAGNOSIS — N183 Chronic kidney disease, stage 3 unspecified: Secondary | ICD-10-CM | POA: Diagnosis present

## 2022-06-17 DIAGNOSIS — I459 Conduction disorder, unspecified: Principal | ICD-10-CM

## 2022-06-17 LAB — BRAIN NATRIURETIC PEPTIDE: B Natriuretic Peptide: 1111.3 pg/mL — ABNORMAL HIGH (ref 0.0–100.0)

## 2022-06-17 LAB — CBC
HCT: 28 % — ABNORMAL LOW (ref 36.0–46.0)
Hemoglobin: 8.8 g/dL — ABNORMAL LOW (ref 12.0–15.0)
MCH: 29.3 pg (ref 26.0–34.0)
MCHC: 31.4 g/dL (ref 30.0–36.0)
MCV: 93.3 fL (ref 80.0–100.0)
Platelets: 187 10*3/uL (ref 150–400)
RBC: 3 MIL/uL — ABNORMAL LOW (ref 3.87–5.11)
RDW: 15.4 % (ref 11.5–15.5)
WBC: 4.3 10*3/uL (ref 4.0–10.5)
nRBC: 0 % (ref 0.0–0.2)

## 2022-06-17 LAB — BASIC METABOLIC PANEL
Anion gap: 10 (ref 5–15)
BUN: 24 mg/dL — ABNORMAL HIGH (ref 8–23)
CO2: 22 mmol/L (ref 22–32)
Calcium: 9.4 mg/dL (ref 8.9–10.3)
Chloride: 106 mmol/L (ref 98–111)
Creatinine, Ser: 2.06 mg/dL — ABNORMAL HIGH (ref 0.44–1.00)
GFR, Estimated: 25 mL/min — ABNORMAL LOW (ref >60)
Glucose, Bld: 95 mg/dL (ref 70–99)
Potassium: 3.9 mmol/L (ref 3.5–5.1)
Sodium: 138 mmol/L (ref 135–145)

## 2022-06-17 LAB — TROPONIN I (HIGH SENSITIVITY)
Troponin I (High Sensitivity): 28 ng/L — ABNORMAL HIGH (ref <18)
Troponin I (High Sensitivity): 31 ng/L — ABNORMAL HIGH (ref <18)

## 2022-06-17 LAB — GLUCOSE, CAPILLARY: Glucose-Capillary: 94 mg/dL (ref 70–99)

## 2022-06-17 MED ORDER — ACETAMINOPHEN 325 MG PO TABS
650.0000 mg | ORAL_TABLET | ORAL | Status: DC | PRN
  Administered 2022-06-20 – 2022-06-21 (×2): 650 mg via ORAL
  Filled 2022-06-17 (×2): qty 2

## 2022-06-17 MED ORDER — DOCUSATE SODIUM 100 MG PO CAPS: 100.0000 mg | ORAL_CAPSULE | Freq: Two times a day (BID) | ORAL | Status: DC | PRN

## 2022-06-17 MED ORDER — POLYETHYLENE GLYCOL 3350 17 G PO PACK: 17.0000 g | PACK | Freq: Every day | ORAL | Status: DC | PRN

## 2022-06-17 MED ORDER — HYDROCHLOROTHIAZIDE 25 MG PO TABS: 12.5000 mg | ORAL_TABLET | Freq: Every day | ORAL | Status: DC

## 2022-06-17 MED ORDER — IRBESARTAN 150 MG PO TABS
150.0000 mg | ORAL_TABLET | Freq: Every day | ORAL | Status: DC
  Administered 2022-06-18 – 2022-06-21 (×4): 150 mg via ORAL
  Filled 2022-06-17 (×4): qty 1

## 2022-06-17 MED ORDER — INSULIN ASPART 100 UNIT/ML IJ SOLN: 0.0000 [IU] | Freq: Three times a day (TID) | INTRAMUSCULAR | Status: DC

## 2022-06-17 MED ORDER — ASPIRIN 81 MG PO TBEC
81.0000 mg | DELAYED_RELEASE_TABLET | Freq: Every day | ORAL | Status: DC
  Administered 2022-06-18 – 2022-06-21 (×4): 81 mg via ORAL
  Filled 2022-06-17 (×4): qty 1

## 2022-06-17 MED ORDER — VITAMIN B-12 1000 MCG PO TABS
1000.0000 ug | ORAL_TABLET | Freq: Every day | ORAL | Status: DC
  Administered 2022-06-18 – 2022-06-21 (×4): 1000 ug via ORAL
  Filled 2022-06-17 (×4): qty 1

## 2022-06-17 MED ORDER — INSULIN ASPART 100 UNIT/ML IJ SOLN: 0.0000 [IU] | Freq: Every day | INTRAMUSCULAR | Status: DC

## 2022-06-17 MED ORDER — ONDANSETRON HCL 4 MG/2ML IJ SOLN: 4.0000 mg | Freq: Four times a day (QID) | INTRAMUSCULAR | Status: DC | PRN

## 2022-06-17 MED ORDER — AMLODIPINE BESYLATE 5 MG PO TABS
5.0000 mg | ORAL_TABLET | Freq: Every day | ORAL | Status: DC
  Administered 2022-06-18 – 2022-06-21 (×4): 5 mg via ORAL
  Filled 2022-06-17 (×4): qty 1

## 2022-06-17 MED ORDER — PAROXETINE HCL 10 MG PO TABS
10.0000 mg | ORAL_TABLET | Freq: Every day | ORAL | Status: DC
  Administered 2022-06-18 – 2022-06-21 (×4): 10 mg via ORAL
  Filled 2022-06-17 (×4): qty 1

## 2022-06-17 NOTE — ED Notes (Signed)
ED TO INPATIENT HANDOFF REPORT  ED Nurse Name and Phone #:  Bland Span [phone]  S Name/Age/Gender Renee Payne 72 y.o. female Room/Bed: H013C/H013C  Code Status   Code Status: Prior  Home/SNF/Other Home Patient oriented to: self, place, time, and situation Is this baseline? Yes   Triage Complete: Triage complete  Chief Complaint CHB (complete heart block) ([place]) [I44.2]  Triage Note Pt arrived to ED POV c/o L ankle swelling (hx of gout), shob, and cough w/ inspiration, vomiting last night after eating supper and had diarrhea.    Allergies Allergies  Allergen Reactions   Ativan [Lorazepam] Other (See Comments)    COMBATIVE AND EXTREMELY VIOLENT    Level of Care/Admitting Diagnosis ED Disposition     ED Disposition  Admit   Condition  --   Comment  Hospital Area: [place] [[id number]]  Level of Care: Telemetry Cardiac [103]  May admit patient to [facility] or [facility] if equivalent level of care is available:: Yes  Covid Evaluation: Confirmed COVID Negative  Diagnosis: CHB (complete heart block) [facility]) [id number]  Admitting Physician: Chancy Milroy [id number]  Attending Physician: Chancy Milroy [id number]  Certification:: I certify this patient will need inpatient services for at least 2 midnights  Estimated Length of Stay: 3          B Medical/Surgery History Past Medical History:  Diagnosis Date   Diabetes mellitus without complication ([place])    Hyperlipidemia    Hypertension    Pulmonary emboli ([place])    Past Surgical History:  Procedure Laterality Date   IVC FILTER INSERTION       A IV Location/Drains/Wounds Patient Lines/Drains/Airways Status     Active Line/Drains/Airways     Name Placement date Placement time Site Days   Peripheral IV 06/17/22 20 G Left Antecubital 06/17/22  2024  Antecubital  less than 1            Intake/Output Last 24 hours No intake or output data in the 24 hours ending 06/17/22  2148  Labs/Imaging Results for orders placed or performed during the hospital encounter of 06/17/22 (from the past 48 hour(s))  Basic metabolic panel     Status: Abnormal   Collection Time: 06/17/22  6:42 PM  Result Value Ref Range   Sodium 138 135 - 145 mmol/L   Potassium 3.9 3.5 - 5.1 mmol/L   Chloride 106 98 - 111 mmol/L   CO2 22 22 - 32 mmol/L   Glucose, Bld 95 70 - 99 mg/dL    Comment: Glucose reference range applies only to samples taken after fasting for at least 8 hours.   BUN 24 (H) 8 - 23 mg/dL   Creatinine, Ser 2.06 (H) 0.44 - 1.00 mg/dL   Calcium 9.4 8.9 - 10.3 mg/dL   GFR, Estimated 25 (L) >60 mL/min    Comment: (NOTE) Calculated using the CKD-EPI Creatinine Equation (2021)    Anion gap 10 5 - 15    Comment: Performed at [facility] [address] [place], [place] [address]  CBC     Status: Abnormal   Collection Time: 06/17/22  6:42 PM  Result Value Ref Range   WBC 4.3 4.0 - 10.5 K/uL   RBC 3.00 (L) 3.87 - 5.11 MIL/uL   Hemoglobin 8.8 (L) 12.0 - 15.0 g/dL   HCT 28.0 (L) 36.0 - 46.0 %   MCV 93.3 80.0 - 100.0 fL   MCH 29.3 26.0 - 34.0 pg  MCHC 31.4 30.0 - 36.0 g/dL   RDW 15.4 11.5 - 15.5 %   Platelets 187 150 - 400 K/uL   nRBC 0.0 0.0 - 0.2 %    Comment: Performed at [facility], [place] [address] [place], [place] [address]  Troponin I (High Sensitivity)     Status: Abnormal   Collection Time: 06/17/22  6:42 PM  Result Value Ref Range   Troponin I (High Sensitivity) 28 (H) <18 ng/L    Comment: (NOTE) Elevated high sensitivity troponin I (hsTnI) values and significant  changes across serial measurements may suggest ACS but many other  chronic and acute conditions are known to elevate hsTnI results.  Refer to the "Links" section for chest pain algorithms and additional  guidance. Performed at [facility], [place] [address], [place], [place] [address]   Brain natriuretic peptide     Status: Abnormal   Collection Time: 06/17/22  6:42 PM   Result Value Ref Range   B Natriuretic Peptide 1,111.3 (H) 0.0 - 100.0 pg/mL    Comment: Performed at [facility] [address] [place],  [address]   DG Ankle Complete Left  Result Date: 06/17/2022 CLINICAL DATA:  Ankle pain and swelling, initial encounter EXAM: LEFT ANKLE COMPLETE - 3+ VIEW COMPARISON:  02/06/2018 FINDINGS: Considerable soft tissue swelling is noted about the left ankle. No acute fracture or dislocation is noted. Tarsal degenerative changes are seen as well as calcaneal spurring. IMPRESSION: Soft tissue swelling without acute bony abnormality. Electronically Signed   By: Inez Catalina M.D.   On: 06/17/2022 19:30   DG Chest 2 View  Result Date: 06/17/2022 CLINICAL DATA:  Chest pain EXAM: CHEST - 2 VIEW COMPARISON:  05/25/22 FINDINGS: Cardiac shadow is within normal limits. Tortuous thoracic aorta is again seen. The lungs are clear bilaterally. No acute bony abnormality is noted. IMPRESSION: No active cardiopulmonary disease. Electronically Signed   By: Inez Catalina M.D.   On: 06/17/2022 19:29    Pending Labs Unresulted Labs (From admission, onward)    None       Vitals/Pain Today's Vitals   06/17/22 1756 06/17/22 1815 06/17/22 2007  BP: (!) 175/75  (!) 171/54  Pulse: (!) 47  (!) 52  Resp: 18  20  Temp: 100 F (37.8 C)    SpO2: 99%  100%  Weight:  83.9 kg   Height:  5' 5.5" (1.664 m)   PainSc:  9      Isolation Precautions No active isolations  Medications Medications - No data to display  Mobility walks     Focused Assessments Cardiac Assessment Handoff:    No results found for: "CKTOTAL", "CKMB", "CKMBINDEX", "TROPONINI" No results found for: "DDIMER" Does the Patient currently have chest pain? No    R Recommendations: See Admitting Provider Note  Report given to:   Additional Notes:  Patient normally walks at home, has not been able to recently due to getting SOB easily. Patient able to stand and pivot to wheelchair in order  to go to bathroom. Family with patient. A&Ox4. No complaints of chest pain at this time. Lungs clear. HR has been low.

## 2022-06-17 NOTE — ED Provider Triage Note (Signed)
Emergency Medicine Provider Triage Evaluation Note  Renee Payne , a 72 y.o. female  was evaluated in triage.  Patient complains of pain to her left ankle.  She also says that she has been having some chest pains that are worse with exertion.  Denies any leg swelling or DVT/PE history.  Says that she is on a blood thinner because "she thinks she had a stroke at some point."   Review of Systems  Positive:  Negative:   Physical Exam  BP (!) 175/75 (BP Location: Right Arm)   Pulse (!) 47   Temp 100 F (37.8 C)   Resp 18   Ht 5' 5.5" (1.664 m)   Wt 83.9 kg   SpO2 99%   BMI 30.32 kg/m  Gen:   Awake, no distress   Resp:  Normal effort  MSK:   Moves extremities without difficulty  Other:  Strong DP pulses, bradycardic to around 50 to, lung sounds clear.  Normal work of breathing.  Medical Decision Making  Medically screening exam initiated at 6:43 PM.  Appropriate orders placed.  Renee Payne was informed that the remainder of the evaluation will be completed by another provider, this initial triage assessment does not replace that evaluation, and the importance of remaining in the ED until their evaluation is complete.     Rhae Hammock, [place] 06/17/22 1845

## 2022-06-17 NOTE — Congregational Nurse Program (Signed)
  Dept: [phone]   Congregational Nurse Program Note  Date of Encounter: 06/17/2022  Past Medical History: Past Medical History:  Diagnosis Date   Diabetes mellitus without complication ([place])    Hyperlipidemia    Hypertension    Pulmonary emboli [facility])     Encounter Details:  CNP Questionnaire - 06/17/22 2149       Questionnaire   Ask client: Do you give verbal consent for me to treat you today? Yes    Student Assistance N/A    Location Patient Served  N/A    Visit Setting with [place] Unknown    Insurance/Financial Assistance Referral N/A    Medication N/A    Medical Provider Yes    Screening Referrals Made N/A    Medical Referrals Made N/A    Medical Appointment Made N/A    Recently w/o PCP, now 1st time PCP visit completed due to CNs referral or appointment made N/A    Food N/A    Transportation N/A    Housing/Utilities N/A    Interpersonal Safety N/A    Interventions N/A    Abnormal to Normal Screening Since Last CN Visit N/A    Screenings CN Performed N/A    Sent Client to Lab for: N/A    Did client attend any of the following based off CNs referral or appointments made? N/A    ED Visit Averted N/A    Life-Saving Intervention Made N/A            Ms. Kapala notified me that she was having some difficulty breathing and was in the emergency room waiting to be admitted. She is currently in the ED hallway and is having some anxiety. Securechat sent to the MD and RN, the MD stated she was just assigned a room and should be transported soon. Informed Ms. Kerins of this update and she was immediately relieved. I will follow up as needed. Catalina Pizza

## 2022-06-17 NOTE — ED Triage Notes (Signed)
Pt arrived to ED POV c/o L ankle swelling (hx of gout), shob, and cough w/ inspiration, vomiting last night after eating supper and had diarrhea.

## 2022-06-17 NOTE — H&P (Signed)
Cardiology Admission History and Physical:   Patient ID: Renee Payne MRN: [id number]; DOB: March 27, 1951   Admission date: 06/17/2022  Primary Care Provider: [facility] Primary Cardiologist: None  Primary Electrophysiologist:  None   Chief Complaint: Dizziness and shortness of breath  Patient Profile:   Renee Payne is a 72 y.o. female with type 2 diabetes, obesity, hypertension  History of Present Illness:   Renee Payne is a pleasant 72 year old lady with a history of type 2 diabetes non-insulin-dependent, hypertension who presents with dizziness and worsening shortness of breath.  She states over the past year she has had dizziness but in the last day or so has had worsening shortness of breath and even had an episode of nausea and vomiting.  She talked with her primary about the symptoms who recommended being evaluated in the ED.  In ED she was found to have a heart rate in the mid 86s and a concerning EKG for advanced conduction disease likely consistent with complete heart block.  Blood pressure has been hemodynamically stable.  No other complaints at rest.  When she does not ambulate she gets more symptomatic   Past Medical History:  Diagnosis Date   Diabetes mellitus without complication ([place])    Hyperlipidemia    Hypertension    Pulmonary emboli ([facility])     Past Surgical History:  Procedure Laterality Date   IVC FILTER INSERTION       Medications Prior to Admission: Prior to Admission medications   Medication Sig Start Date End Date Taking? Authorizing Provider  amLODipine (NORVASC) 5 MG tablet Take 5 mg by mouth daily. 09/20/16  Yes [provider]  aspirin EC 81 MG EC tablet Take 1 tablet (81 mg total) by mouth daily. 11/16/16  Yes Vann, Jessica U, DO  hydrochlorothiazide (HYDRODIURIL) 12.5 MG tablet Take 12.5 mg by mouth daily. 09/19/16  Yes [provider]  metFORMIN (GLUCOPHAGE) 500 MG tablet Take 500 mg by mouth 2 (two)  times daily with a meal.   Yes [provider]  PARoxetine (PAXIL) 10 MG tablet Take 10 mg by mouth daily.   Yes [provider]  triamcinolone cream (KENALOG) 0.1 % Apply 1 application topically daily as needed for itching. 04/16/15  Yes [provider]  UNABLE TO FIND Outpatient OT Dx: CVA eval and treat 11/15/16  Yes Eulogio Bear U, DO  valsartan (DIOVAN) 160 MG tablet Take 160 mg by mouth daily.   Yes [provider]  vitamin B-12 (CYANOCOBALAMIN) 1000 MCG tablet Take 1 tablet (1,000 mcg total) by mouth daily. 11/16/16  Yes Eulogio Bear U, DO  warfarin (COUMADIN) 5 MG tablet Take 5-7.5 mg by mouth See admin instructions. Take '5mg'$  on M W F and take 7.'5mg'$  on Tue Thur Sat Sun   Yes [provider]     Allergies:    Allergies  Allergen Reactions   Ativan [Lorazepam] Other (See Comments)    COMBATIVE AND EXTREMELY VIOLENT    Social History:   Social History   Socioeconomic History   Marital status: Single    Spouse name: Not on file   Number of children: Not on file   Years of education: Not on file   Highest education level: Not on file  Occupational History   Not on file  Tobacco Use   Smoking status: Never   Smokeless tobacco: Never  Vaping Use   Vaping Use: Never used  Substance and Sexual Activity   Alcohol use: No  Drug use: No   Sexual activity: Never  Other Topics Concern   Not on file  Social History Narrative   Not on file   Social Determinants of Health   Financial Resource Strain: Not on file  Food Insecurity: Not on file  Transportation Needs: Not on file  Physical Activity: Not on file  Stress: Not on file  Social Connections: Not on file  Intimate Partner Violence: Not on file    Family History:   The patient's family history is not on file.     Review of Systems: [y] = yes, '[ ]'$  = no   General: Weight gain '[ ]'$ ; Weight loss '[ ]'$ ; Anorexia '[ ]'$ ; Fatigue '[ ]'$ ; Fever '[ ]'$ ; Chills '[ ]'$ ; Weakness '[ ]'$   Cardiac:  Chest pain/pressure '[ ]'$ ; Resting SOB '[ ]'$ ; Exertional SOB Blue.Reese ]; Orthopnea '[ ]'$ ; Pedal Edema [ y]; Palpitations '[ ]'$ ; Syncope '[ ]'$ ; Presyncope '[ ]'$ ; Paroxysmal nocturnal dyspnea'[ ]'$   Pulmonary: Cough '[ ]'$ ; Wheezing'[ ]'$ ; Hemoptysis'[ ]'$ ; Sputum '[ ]'$ ; Snoring '[ ]'$   GI: Vomiting[ y]; Dysphagia'[ ]'$ ; Melena'[ ]'$ ; Hematochezia '[ ]'$ ; Heartburn'[ ]'$ ; Abdominal pain '[ ]'$ ; Constipation '[ ]'$ ; Diarrhea '[ ]'$ ; BRBPR '[ ]'$   GU: Hematuria'[ ]'$ ; Dysuria '[ ]'$ ; Nocturia'[ ]'$   Vascular: Pain in legs with walking '[ ]'$ ; Pain in feet with lying flat '[ ]'$ ; Non-healing sores '[ ]'$ ; Stroke '[ ]'$ ; TIA '[ ]'$ ; Slurred speech '[ ]'$ ;  Neuro: Headaches'[ ]'$ ; Vertigo'[ ]'$ ; Seizures'[ ]'$ ; Paresthesias'[ ]'$ ;Blurred vision '[ ]'$ ; Diplopia '[ ]'$ ; Vision changes '[ ]'$   Ortho/Skin: Arthritis '[ ]'$ ; Joint pain '[ ]'$ ; Muscle pain '[ ]'$ ; Joint swelling '[ ]'$ ; Back Pain '[ ]'$ ; Rash '[ ]'$   Psych: Depression'[ ]'$ ; Anxiety'[ ]'$   Heme: Bleeding problems '[ ]'$ ; Clotting disorders '[ ]'$ ; Anemia '[ ]'$   Endocrine: Diabetes '[ ]'$ ; Thyroid dysfunction'[ ]'$   Physical Exam/Data:   Vitals:   06/17/22 1815 06/17/22 2007 06/17/22 2219 06/17/22 2220  BP:  (!) 171/54  (!) 168/56  Pulse:  (!) 52  (!) 45  Resp:  20  20  Temp:   98.6 F (37 C)   TempSrc:   Oral   SpO2:  100%  98%  Weight: 83.9 kg     Height: 5' 5.5" (1.664 m)      No intake or output data in the 24 hours ending 06/17/22 2250 Filed Weights   06/17/22 1815  Weight: 83.9 kg   Body mass index is 30.32 kg/m.  General:  Well nourished, well developed, in no acute distress HEENT: normal Lymph: no adenopathy Neck: no JVD Endocrine:  No thryomegaly Vascular: No carotid bruits; FA pulses 2+ bilaterally without bruits  Cardiac:  normal S1, S2; RRR; no murmur  Lungs:  clear to auscultation bilaterally, no wheezing, rhonchi or rales  Abd: soft, nontender, no hepatomegaly  Ext: no edema Musculoskeletal:  No deformities, BUE and BLE strength normal and equal Skin: warm and dry  Neuro:  CNs 2-12 intact, no focal abnormalities noted Psych:  Normal affect     EKG:  The ECG that was done in the ED was personally reviewed and demonstrates complete heart block  Relevant CV Studies: BNP 1111 Troponin elevated at 31 an 28  Laboratory Data:  Chemistry Recent Labs  Lab 06/17/22 1842  NA 138  K 3.9  CL 106  CO2 22  GLUCOSE 95  BUN 24*  CREATININE 2.06*  CALCIUM 9.4  GFRNONAA 25*  ANIONGAP 10    No results for input(s): "  PROT", "ALBUMIN", "AST", "ALT", "ALKPHOS", "BILITOT" in the last 168 hours. Hematology Recent Labs  Lab 06/17/22 1842  WBC 4.3  RBC 3.00*  HGB 8.8*  HCT 28.0*  MCV 93.3  MCH 29.3  MCHC 31.4  RDW 15.4  PLT 187   Cardiac EnzymesNo results for input(s): "TROPONINI" in the last 168 hours. No results for input(s): "TROPIPOC" in the last 168 hours.  BNP Recent Labs  Lab 06/17/22 1842  BNP 1,111.3*    DDimer No results for input(s): "DDIMER" in the last 168 hours.  Radiology/Studies:  DG Ankle Complete Left  Result Date: 06/17/2022 CLINICAL DATA:  Ankle pain and swelling, initial encounter EXAM: LEFT ANKLE COMPLETE - 3+ VIEW COMPARISON:  02/06/2018 FINDINGS: Considerable soft tissue swelling is noted about the left ankle. No acute fracture or dislocation is noted. Tarsal degenerative changes are seen as well as calcaneal spurring. IMPRESSION: Soft tissue swelling without acute bony abnormality. Electronically Signed   By: Inez Catalina M.D.   On: 06/17/2022 19:30   DG Chest 2 View  Result Date: 06/17/2022 CLINICAL DATA:  Chest pain EXAM: CHEST - 2 VIEW COMPARISON:  05/25/22 FINDINGS: Cardiac shadow is within normal limits. Tortuous thoracic aorta is again seen. The lungs are clear bilaterally. No acute bony abnormality is noted. IMPRESSION: No active cardiopulmonary disease. Electronically Signed   By: Inez Catalina M.D.   On: 06/17/2022 19:29    Assessment and Plan:   Complete heart block Heart rates in the mid 67s History of CVA Type 2 diabetes mellitus Acute kidney injury on CKD Elevated  proBNP Hypertension Obesity Metabolic syndrome History of PE on chronic anticoagulation   -Holding any AV nodal blockers. -Plan for pacemaker most likely Monday -Holding Coumadin. -Holding HCTZ and metformin due to acute kidney injury. -Would be a good candidate for SGLT2 inhibitor transition once kidneys normalize.  CRITICAL CARE Performed by: Chancy Milroy   Total critical care time: 50 minutes  Critical care time was exclusive of separately billable procedures and treating other patients.  Critical care was necessary to treat or prevent imminent or life-threatening deterioration.  Critical care was time spent personally by me on the following activities: development of treatment plan with patient and/or surrogate as well as nursing, discussions with consultants, evaluation of patient's response to treatment, examination of patient, obtaining history from patient or surrogate, ordering and performing treatments and interventions, ordering and review of laboratory studies, ordering and review of radiographic studies, pulse oximetry and re-evaluation of patient's condition.   Severity of Illness: The appropriate patient status for this patient is INPATIENT. Inpatient status is judged to be reasonable and necessary in order to provide the required intensity of service to ensure the patient's safety. The patient's presenting symptoms, physical exam findings, and initial radiographic and laboratory data in the context of their chronic comorbidities is felt to place them at high risk for further clinical deterioration. Furthermore, it is not anticipated that the patient will be medically stable for discharge from the hospital within 2 midnights of admission.   * I certify that at the point of admission it is my clinical judgment that the patient will require inpatient hospital care spanning beyond 2 midnights from the point of admission due to high intensity of service, high risk for  further deterioration and high frequency of surveillance required.*   For questions or updates, please contact [place] Please consult [email or web address] for contact info under        Signed, Chancy Milroy, MD  06/17/2022 10:50 PM

## 2022-06-18 DIAGNOSIS — I442 Atrioventricular block, complete: Principal | ICD-10-CM

## 2022-06-18 LAB — GLUCOSE, CAPILLARY
Glucose-Capillary: 87 mg/dL (ref 70–99)
Glucose-Capillary: 120 mg/dL — ABNORMAL HIGH (ref 70–99)
Glucose-Capillary: 117 mg/dL — ABNORMAL HIGH (ref 70–99)
Glucose-Capillary: 90 mg/dL (ref 70–99)

## 2022-06-18 LAB — BASIC METABOLIC PANEL
Anion gap: 5 (ref 5–15)
BUN: 25 mg/dL — ABNORMAL HIGH (ref 8–23)
CO2: 24 mmol/L (ref 22–32)
Calcium: 8.9 mg/dL (ref 8.9–10.3)
Chloride: 111 mmol/L (ref 98–111)
Creatinine, Ser: 1.96 mg/dL — ABNORMAL HIGH (ref 0.44–1.00)
GFR, Estimated: 27 mL/min — ABNORMAL LOW (ref >60)
Glucose, Bld: 91 mg/dL (ref 70–99)
Potassium: 3.7 mmol/L (ref 3.5–5.1)
Sodium: 140 mmol/L (ref 135–145)

## 2022-06-18 LAB — SURGICAL PCR SCREEN
MRSA, PCR: NEGATIVE
Staphylococcus aureus: NEGATIVE

## 2022-06-18 LAB — PROTIME-INR
INR: 3.2 — ABNORMAL HIGH (ref 0.8–1.2)
Prothrombin Time: 32.7 seconds — ABNORMAL HIGH (ref 11.4–15.2)

## 2022-06-18 LAB — MRSA NEXT GEN BY PCR, NASAL: MRSA by PCR Next Gen: NOT DETECTED

## 2022-06-18 NOTE — Consult Note (Signed)
Cardiology Consultation   Patient ID: Renee Payne MRN: [id number]; DOB: Jul 10, 1950  Admit date: 06/17/2022 Date of Consult: 06/18/2022  PCP:  [facility] Cardiologist:  None        Patient Profile:   Renee Payne is a 72 y.o. female with a hx of diabetes, hypertension who is being seen 06/18/2022 for the evaluation of complete heart block at the request of Sinda Du.  History of Present Illness:   Renee Payne is a 72 year old lady with a history of diabetes, hypertension and worsening shortness of breath.  She has been having more frequent shortness of breath over the last few days.  She did have an episode of nausea and vomiting.  She discussed this with her primary physician who recommended being evaluated in the emergency room.  Once in the emergency room, she was found to be in complete heart block with heart rates in the 40s.  She feels well in bed, but does feel short of breath when she is ambulating.   Past Medical History:  Diagnosis Date   Diabetes mellitus without complication ([place])    Hyperlipidemia    Hypertension    Pulmonary emboli ([facility])     Past Surgical History:  Procedure Laterality Date   IVC FILTER INSERTION       Home Medications:  Prior to Admission medications   Medication Sig Start Date End Date Taking? Authorizing Provider  amLODipine (NORVASC) 5 MG tablet Take 5 mg by mouth daily. 09/20/16  Yes [provider]  aspirin EC 81 MG EC tablet Take 1 tablet (81 mg total) by mouth daily. 11/16/16  Yes Vann, Jessica U, DO  hydrochlorothiazide (HYDRODIURIL) 12.5 MG tablet Take 12.5 mg by mouth daily. 09/19/16  Yes [provider]  losartan (COZAAR) 50 MG tablet Take 50 mg by mouth daily. 05/19/22  Yes [provider]  metFORMIN (GLUCOPHAGE) 500 MG tablet Take 500 mg by mouth 2 (two) times daily with a meal.   Yes [provider]  PARoxetine (PAXIL) 10 MG tablet Take  10 mg by mouth daily.   Yes [provider]  rosuvastatin (CRESTOR) 20 MG tablet Take 20 mg by mouth at bedtime. 06/17/22  Yes [provider]  triamcinolone cream (KENALOG) 0.1 % Apply 1 application topically daily as needed for itching. 04/16/15  Yes [provider]  valsartan (DIOVAN) 160 MG tablet Take 160 mg by mouth daily.   Yes [provider]  vitamin B-12 (CYANOCOBALAMIN) 1000 MCG tablet Take 1 tablet (1,000 mcg total) by mouth daily. 11/16/16  Yes Eulogio Bear U, DO  warfarin (COUMADIN) 5 MG tablet Take 5-7.5 mg by mouth See admin instructions. Take '5mg'$  on M W F and take 7.'5mg'$  on Tue Thur Sat Sun   Yes [provider]    Inpatient Medications: Scheduled Meds:  amLODipine  5 mg Oral Daily   aspirin EC  81 mg Oral Daily   cyanocobalamin  1,000 mcg Oral Daily   insulin aspart  0-20 Units Subcutaneous TID WC   insulin aspart  0-5 Units Subcutaneous QHS   irbesartan  150 mg Oral Daily   PARoxetine  10 mg Oral Daily   Continuous Infusions:  PRN Meds: acetaminophen, docusate sodium, ondansetron (ZOFRAN) IV, polyethylene glycol  Allergies:    Allergies  Allergen Reactions   Ativan [Lorazepam] Other (See Comments)    COMBATIVE AND EXTREMELY VIOLENT   Omeprazole Other (See Comments)  The front of her body felt very warm after one dose of this med.    Social History:   Social History   Socioeconomic History   Marital status: Single    Spouse name: Not on file   Number of children: Not on file   Years of education: Not on file   Highest education level: Not on file  Occupational History   Not on file  Tobacco Use   Smoking status: Never   Smokeless tobacco: Never  Vaping Use   Vaping Use: Never used  Substance and Sexual Activity   Alcohol use: No   Drug use: No   Sexual activity: Never  Other Topics Concern   Not on file  Social History Narrative   Not on file   Social Determinants of Health   Financial Resource  Strain: Not on file  Food Insecurity: No Food Insecurity (06/18/2022)   Hunger Vital Sign    Worried About Running Out of Food in the Last Year: Never true    Ran Out of Food in the Last Year: Never true  Transportation Needs: No Transportation Needs (06/18/2022)   PRAPARE - Hydrologist (Medical): No    Lack of Transportation (Non-Medical): No  Physical Activity: Not on file  Stress: Not on file  Social Connections: Not on file  Intimate Partner Violence: Not At Risk (06/18/2022)   Humiliation, Afraid, Rape, and Kick questionnaire    Fear of Current or Ex-Partner: No    Emotionally Abused: No    Physically Abused: No    Sexually Abused: No    Family History:   History reviewed. No pertinent family history.   ROS:  Please see the history of present illness.   All other ROS reviewed and negative.     Physical Exam/Data:   Vitals:   06/18/22 0400 06/18/22 0500 06/18/22 0600 06/18/22 0720  BP: (!) 150/71  134/80 109/67  Pulse: (!) 44  (!) 41 (!) 44  Resp: '14  18 18  '$ Temp:    98.9 F (37.2 C)  TempSrc:    Oral  SpO2: 92%  91% 97%  Weight:  81.4 kg    Height:       No intake or output data in the 24 hours ending 06/18/22 1059    06/18/2022    5:00 AM 06/17/2022   11:00 PM 06/17/2022    6:15 PM  Last 3 Weights  Weight (lbs) 179 lb 7.3 oz 179 lb 7.3 oz 185 lb  Weight (kg) 81.4 kg 81.4 kg 83.915 kg     Body mass index is 33.91 kg/m.  General:  Well nourished, well developed, in no acute distress HEENT: normal Neck: no JVD Vascular: No carotid bruits; Distal pulses 2+ bilaterally Cardiac: Bradycardic; no murmur  Lungs:  clear to auscultation bilaterally, no wheezing, rhonchi or rales  Abd: soft, nontender, no hepatomegaly  Ext: no edema Musculoskeletal:  No deformities, BUE and BLE strength normal and equal Skin: warm and dry  Neuro:  CNs 2-12 intact, no focal abnormalities noted Psych:  Normal affect   EKG:  The EKG was personally reviewed and  demonstrates: Complete heart block Telemetry:  Telemetry was personally reviewed and demonstrates: Complete heart block  Relevant CV Studies: Echo pending  Laboratory Data:  High Sensitivity Troponin:   Recent Labs  Lab 06/17/22 1842 06/17/22 2044  TROPONINIHS 28* 31*     Chemistry Recent Labs  Lab 06/17/22 1842 06/18/22 0626  NA 138  140  K 3.9 3.7  CL 106 111  CO2 22 24  GLUCOSE 95 91  BUN 24* 25*  CREATININE 2.06* 1.96*  CALCIUM 9.4 8.9  GFRNONAA 25* 27*  ANIONGAP 10 5    No results for input(s): "PROT", "ALBUMIN", "AST", "ALT", "ALKPHOS", "BILITOT" in the last 168 hours. Lipids No results for input(s): "CHOL", "TRIG", "HDL", "LABVLDL", "LDLCALC", "CHOLHDL" in the last 168 hours.  Hematology Recent Labs  Lab 06/17/22 1842  WBC 4.3  RBC 3.00*  HGB 8.8*  HCT 28.0*  MCV 93.3  MCH 29.3  MCHC 31.4  RDW 15.4  PLT 187   Thyroid No results for input(s): "TSH", "FREET4" in the last 168 hours.  BNP Recent Labs  Lab 06/17/22 1842  BNP 1,111.3*    DDimer No results for input(s): "DDIMER" in the last 168 hours.   Radiology/Studies:  DG Ankle Complete Left  Result Date: 06/17/2022 CLINICAL DATA:  Ankle pain and swelling, initial encounter EXAM: LEFT ANKLE COMPLETE - 3+ VIEW COMPARISON:  02/06/2018 FINDINGS: Considerable soft tissue swelling is noted about the left ankle. No acute fracture or dislocation is noted. Tarsal degenerative changes are seen as well as calcaneal spurring. IMPRESSION: Soft tissue swelling without acute bony abnormality. Electronically Signed   By: Inez Catalina M.D.   On: 06/17/2022 19:30   DG Chest 2 View  Result Date: 06/17/2022 CLINICAL DATA:  Chest pain EXAM: CHEST - 2 VIEW COMPARISON:  05/25/22 FINDINGS: Cardiac shadow is within normal limits. Tortuous thoracic aorta is again seen. The lungs are clear bilaterally. No acute bony abnormality is noted. IMPRESSION: No active cardiopulmonary disease. Electronically Signed   By: Inez Catalina M.D.    On: 06/17/2022 19:29     Assessment and Plan:   Complete heart block: To this point no obvious cause for complete heart block.  No medications that would complicate her heart block.  Due to that, we Mumin Denomme need pacemaker implant.  Thos Matsumoto plan for transthoracic echo today.  Likely pacemaker implant on Monday.  Delson Dulworth continue to hold warfarin. Hypertension: Elevated today.  Destaney Sarkis reassess after pacemaker implant. Diabetes: Continue home medications   Risk Assessment/Risk Scores:       For questions or updates, please contact [facility] Please consult [email or web address] for contact info under    Signed, Connery Shiffler Meredith Leeds, MD  06/18/2022 10:59 AM

## 2022-06-18 NOTE — ED Provider Notes (Signed)
[facility] PROGRESSIVE CARE Provider Note  CSN: [id number] Arrival date & time: 06/17/22 1748  Chief Complaint(s) Shortness of Breath, Joint Swelling, and Vomiting/Diarrhea  HPI Beatrice BRUNILDA MONTIERTH is a 72 y.o. female with PMH T2DM, HTN, HLD, previous pulmonary embolus, previous CVA, OSA who presents emergency department for evaluation of exertional shortness of breath, cough and lower extremity swelling.  In addition over the last 24 hours she is endorsing vomiting and diarrhea.  Patient receives majority of her medical care at [facility] but wanted to come to [facility] today due to reported dissatisfaction with care there.  Here in the emergency room, patient endorses pain at her ankles due to lower extremity swelling and some mild shortness of breath but otherwise is asymptomatic.  She states that she feels that over the last 2 weeks her exertional capacity has significantly lessened and she is unable to make it to the bathroom and back with having to stop and gasp for air.  Endorses mild intermittent chest pain with this.  Currently denies abdominal pain, nausea, vomiting, headache, fever or other systemic symptoms.   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 Authorizing Provider  amLODipine (NORVASC) 5 MG tablet Take 5 mg by mouth daily. 09/20/16  Yes [provider]  aspirin EC 81 MG EC tablet Take 1 tablet (81 mg total) by mouth daily. 11/16/16  Yes Vann, Jessica U, DO  hydrochlorothiazide (HYDRODIURIL) 12.5 MG tablet Take 12.5 mg by mouth daily. 09/19/16  Yes [provider]  losartan (COZAAR) 50 MG tablet Take 50 mg by mouth daily. 05/19/22  Yes [provider]  metFORMIN (GLUCOPHAGE) 500 MG tablet Take 500 mg by mouth 2 (two) times daily with a meal.   Yes [provider]  PARoxetine (PAXIL) 10 MG tablet Take 10 mg by mouth daily.   Yes [provider]  rosuvastatin (CRESTOR) 20 MG tablet Take 20 mg by mouth at bedtime. 06/17/22  Yes [provider]  triamcinolone cream (KENALOG) 0.1 % Apply 1 application topically daily as needed for itching. 04/16/15  Yes [provider]  valsartan (DIOVAN) 160 MG tablet Take 160 mg by mouth daily.   Yes [provider]  vitamin B-12 (CYANOCOBALAMIN) 1000 MCG tablet Take 1 tablet (1,000 mcg total) by mouth daily. 11/16/16  Yes Eulogio Bear U, DO  warfarin (COUMADIN) 5 MG tablet Take 5-7.5 mg by mouth See admin instructions. Take '5mg'$  on M W F and take 7.'5mg'$  on Tue Thur Sat Sun   Yes [provider]  Past Surgical History Past Surgical History:  Procedure Laterality Date   IVC FILTER INSERTION     Family History History reviewed. No pertinent family history.  Social History Social History   Tobacco Use   Smoking status: Never   Smokeless tobacco: Never  Vaping Use   Vaping Use: Never used  Substance Use Topics   Alcohol use: No   Drug use: No   Allergies Ativan [lorazepam] and Omeprazole  Review of Systems Review of Systems   Respiratory:  Positive for cough and shortness of breath.   Cardiovascular:  Positive for chest pain and leg swelling.  Gastrointestinal:  Positive for diarrhea and vomiting.    Physical Exam Vital Signs  I have reviewed the triage vital signs BP (!) 170/85 (BP Location: Right Arm)   Pulse (!) 43   Temp 98.5 F (36.9 C) (Oral)   Resp 20   Ht '5\' 1"'$  (1.549 m)   Wt 81.4 kg   SpO2 100%   BMI 33.91 kg/m   Physical Exam Vitals and nursing note reviewed.  Constitutional:      General: She is not in acute distress.    Appearance: She is well-developed.  HENT:     Head: Normocephalic and atraumatic.  Eyes:     Conjunctiva/sclera: Conjunctivae normal.  Cardiovascular:     Rate and Rhythm: Bradycardia present. Rhythm irregular.     Heart sounds: No murmur heard. Pulmonary:     Effort: Pulmonary effort is normal. No respiratory distress.     Breath sounds: Normal breath sounds.  Abdominal:     Palpations: Abdomen is soft.     Tenderness: There is no abdominal tenderness.  Musculoskeletal:        General: No swelling.     Cervical back: Neck supple.     Right lower leg: Edema present.     Left lower leg: Edema present.  Skin:    General: Skin is warm and dry.     Capillary Refill: Capillary refill takes less than 2 seconds.  Neurological:     Mental Status: She is alert.  Psychiatric:        Mood and Affect: Mood normal.     ED Results and Treatments Labs (all labs ordered are listed, but only abnormal results are displayed) Labs Reviewed  BASIC METABOLIC PANEL - Abnormal; Notable for the following components:      Result Value   BUN 24 (*)    Creatinine, Ser 2.06 (*)    GFR, Estimated 25 (*)    All other components within normal limits  CBC - Abnormal; Notable for the following components:   RBC 3.00 (*)    Hemoglobin 8.8 (*)    HCT 28.0 (*)    All other components within normal limits  BRAIN NATRIURETIC PEPTIDE - Abnormal; Notable for the following  components:   B Natriuretic Peptide 1,111.3 (*)    All other components within normal limits  BASIC METABOLIC PANEL - Abnormal; Notable for the following components:   BUN 25 (*)    Creatinine, Ser 1.96 (*)    GFR, Estimated 27 (*)    All other components within normal limits  GLUCOSE, CAPILLARY - Abnormal; Notable for the following components:   Glucose-Capillary 120 (*)    All other components within normal limits  TROPONIN I (HIGH SENSITIVITY) - Abnormal; Notable for the following components:   Troponin I (High Sensitivity) 28 (*)    All other components within normal limits  TROPONIN I (HIGH  SENSITIVITY) - Abnormal; Notable for the following components:   Troponin I (High Sensitivity) 31 (*)    All other components within normal limits  MRSA NEXT GEN BY PCR, NASAL  SURGICAL PCR SCREEN  GLUCOSE, CAPILLARY  GLUCOSE, CAPILLARY  HEMOGLOBIN A1C                                                                                                                          Radiology DG Ankle Complete Left  Result Date: 06/17/2022 CLINICAL DATA:  Ankle pain and swelling, initial encounter EXAM: LEFT ANKLE COMPLETE - 3+ VIEW COMPARISON:  02/06/2018 FINDINGS: Considerable soft tissue swelling is noted about the left ankle. No acute fracture or dislocation is noted. Tarsal degenerative changes are seen as well as calcaneal spurring. IMPRESSION: Soft tissue swelling without acute bony abnormality. Electronically Signed   By: Inez Catalina M.D.   On: 06/17/2022 19:30   DG Chest 2 View  Result Date: 06/17/2022 CLINICAL DATA:  Chest pain EXAM: CHEST - 2 VIEW COMPARISON:  05/25/22 FINDINGS: Cardiac shadow is within normal limits. Tortuous thoracic aorta is again seen. The lungs are clear bilaterally. No acute bony abnormality is noted. IMPRESSION: No active cardiopulmonary disease. Electronically Signed   By: Inez Catalina M.D.   On: 06/17/2022 19:29    Pertinent labs & imaging results that were available  during my care of the patient were reviewed by me and considered in my medical decision making (see MDM for details).  Medications Ordered in ED Medications  PARoxetine (PAXIL) tablet 10 mg (10 mg Oral Given 06/18/22 0909)  amLODipine (NORVASC) tablet 5 mg (5 mg Oral Given 06/18/22 0908)  aspirin EC tablet 81 mg (81 mg Oral Given 06/18/22 0908)  cyanocobalamin (VITAMIN B12) tablet 1,000 mcg (1,000 mcg Oral Given 06/18/22 0908)  irbesartan (AVAPRO) tablet 150 mg (150 mg Oral Given 06/18/22 0908)  acetaminophen (TYLENOL) tablet 650 mg (has no administration in time range)  ondansetron (ZOFRAN) injection 4 mg (has no administration in time range)  insulin aspart (novoLOG) injection 0-20 Units ( Subcutaneous Not Given 06/18/22 0626)  insulin aspart (novoLOG) injection 0-5 Units ( Subcutaneous Not Given 06/17/22 2316)  docusate sodium (COLACE) capsule 100 mg (has no administration in time range)  polyethylene glycol (MIRALAX / GLYCOLAX) packet 17 g (has no administration in time range)  Procedures .Critical Care  Performed by: Teressa Lower, MD Authorized by: Teressa Lower, MD   Critical care provider statement:    Critical care time (minutes):  30   Critical care was necessary to treat or prevent imminent or life-threatening deterioration of the following conditions:  Cardiac failure   Critical care was time spent personally by me on the following activities:  Development of treatment plan with patient or surrogate, discussions with consultants, evaluation of patient's response to treatment, examination of patient, ordering and review of laboratory studies, ordering and review of radiographic studies, ordering and performing treatments and interventions, pulse oximetry, re-evaluation of patient's condition and review of old charts   (including critical care time)  Medical  Decision Making / ED Course   This patient presents to the ED for concern of shortness of breath, cough, leg swelling, this involves an extensive number of treatment options, and is a complaint that carries with it a high risk of complications and morbidity.  The differential diagnosis includes Pe, PTX, Pulmonary Edema, ARDS, COPD/Asthma, ACS, CHF exacerbation, Arrhythmia, Pericardial Effusion/Tamponade, Anemia, Sepsis, Acidosis/Hypercapnia, Anxiety, Viral URI  MDM: Patient seen emerged part for evaluation of multiple complaints described above.  Physical exam with an irregular bradycardia, mild lower extremity edema but is otherwise unremarkable.  Laboratory evaluation with a hemoglobin of 8.8, creatinine 2.06, BNP elevated to 1111.3, high-sensitivity troponin 28 with delta troponin 31.  Ankle x-ray unremarkable.  Chest x-ray unremarkable. Initial ECG is concerning for what appears to be a new third-degree heart block.  I do not have any ECGs to compare this to but given her history of progressive exertional dyspnea and laboratory evaluation appears to show fluid overload, I have suspicions that her symptoms today are likely caused to heart failure secondary to this electrical abnormality.  I spoke with the cardiology fellow on-call Dr. Shirlee Latch who reviewed the ECG and agrees that the patient is likely in third-degree heart block but as she is maintaining her blood pressures without difficulty, we will hold off on dopamine or Isuprel therapy at this time.  Patient then admitted to the cardiology service.   Additional history obtained: -Additional history obtained from multiple family members -External records from outside source obtained and reviewed including: Chart review including previous notes, labs, imaging, consultation notes   Lab Tests: -I ordered, reviewed, and interpreted labs.   The pertinent results include:   Labs Reviewed  BASIC METABOLIC PANEL - Abnormal; Notable for the following  components:      Result Value   BUN 24 (*)    Creatinine, Ser 2.06 (*)    GFR, Estimated 25 (*)    All other components within normal limits  CBC - Abnormal; Notable for the following components:   RBC 3.00 (*)    Hemoglobin 8.8 (*)    HCT 28.0 (*)    All other components within normal limits  BRAIN NATRIURETIC PEPTIDE - Abnormal; Notable for the following components:   B Natriuretic Peptide 1,111.3 (*)    All other components within normal limits  BASIC METABOLIC PANEL - Abnormal; Notable for the following components:   BUN 25 (*)    Creatinine, Ser 1.96 (*)    GFR, Estimated 27 (*)    All other components within normal limits  GLUCOSE, CAPILLARY - Abnormal; Notable for the following components:   Glucose-Capillary 120 (*)    All other components within normal limits  TROPONIN I (HIGH SENSITIVITY) - Abnormal; Notable for the following components:   Troponin I (  High Sensitivity) 28 (*)    All other components within normal limits  TROPONIN I (HIGH SENSITIVITY) - Abnormal; Notable for the following components:   Troponin I (High Sensitivity) 31 (*)    All other components within normal limits  MRSA NEXT GEN BY PCR, NASAL  SURGICAL PCR SCREEN  GLUCOSE, CAPILLARY  GLUCOSE, CAPILLARY  HEMOGLOBIN A1C      EKG   EKG Interpretation  Date/Time:  Friday June 17 2022 18:00:10 EST Ventricular Rate:  53 PR Interval:    QRS Duration: 122 QT Interval:  472 QTC Calculation: 442 R Axis:   -47 Text Interpretation: 3rd degree AV block No previous ECGs available Confirmed by Watts ([id number]) on 06/17/2022 8:04:37 PM         Imaging Studies ordered: I ordered imaging studies including chest x-ray, ankle x-ray I independently visualized and interpreted imaging. I agree with the radiologist interpretation   Medicines ordered and prescription drug management: Meds ordered this encounter  Medications   PARoxetine (PAXIL) tablet 10 mg   amLODipine (NORVASC) tablet 5 mg    DISCONTD: hydrochlorothiazide (HYDRODIURIL) tablet 12.5 mg   aspirin EC tablet 81 mg   cyanocobalamin (VITAMIN B12) tablet 1,000 mcg   irbesartan (AVAPRO) tablet 150 mg   acetaminophen (TYLENOL) tablet 650 mg   ondansetron (ZOFRAN) injection 4 mg   insulin aspart (novoLOG) injection 0-20 Units    Order Specific Question:   Correction coverage:    Answer:   Resistant (obese, steroids)    Order Specific Question:   CBG < 70:    Answer:   Implement Hypoglycemia Standing Orders and refer to Hypoglycemia Standing Orders sidebar report    Order Specific Question:   CBG 70 - 120:    Answer:   0 units    Order Specific Question:   CBG 121 - 150:    Answer:   3 units    Order Specific Question:   CBG 151 - 200:    Answer:   4 units    Order Specific Question:   CBG 201 - 250:    Answer:   7 units    Order Specific Question:   CBG 251 - 300:    Answer:   11 units    Order Specific Question:   CBG 301 - 350:    Answer:   15 units    Order Specific Question:   CBG 351 - 400:    Answer:   20 units    Order Specific Question:   CBG > 400    Answer:   call MD and obtain STAT lab verification   insulin aspart (novoLOG) injection 0-5 Units    Order Specific Question:   Correction coverage:    Answer:   HS scale    Order Specific Question:   CBG < 70:    Answer:   Implement Hypoglycemia Standing Orders and refer to Hypoglycemia Standing Orders sidebar report    Order Specific Question:   CBG 70 - 120:    Answer:   0 units    Order Specific Question:   CBG 121 - 150:    Answer:   0 units    Order Specific Question:   CBG 151 - 200:    Answer:   0 units    Order Specific Question:   CBG 201 - 250:    Answer:   2 units    Order Specific Question:   CBG 251 - 300:    Answer:  3 units    Order Specific Question:   CBG 301 - 350:    Answer:   4 units    Order Specific Question:   CBG 351 - 400:    Answer:   5 units    Order Specific Question:   CBG > 400    Answer:   call MD and obtain STAT  lab verification   docusate sodium (COLACE) capsule 100 mg   polyethylene glycol (MIRALAX / GLYCOLAX) packet 17 g    -I have reviewed the patients home medicines and have made adjustments as needed  Critical interventions Cardiology consultation  Consultations Obtained: I requested consultation with the cardiologist on-call Dr. Allyson Sabal,  and discussed lab and imaging findings as well as pertinent plan - they recommend: Cardiology admission   Cardiac Monitoring: The patient was maintained on a cardiac monitor.  I personally viewed and interpreted the cardiac monitored which showed an underlying rhythm of: Third-degree heart block, heart rates in the 40s  Social Determinants of Health:  Factors impacting patients care include: Most care obtained from [facility]   Reevaluation: After the interventions noted above, I reevaluated the patient and found that they have :stayed the same  Co morbidities that complicate the patient evaluation  Past Medical History:  Diagnosis Date   Diabetes mellitus without complication ([place])    Hyperlipidemia    Hypertension    Pulmonary emboli ([place])       Dispostion: I considered admission for this patient, and due to new third-degree heart block patient will require hospital admission     Final Clinical Impression(s) / ED Diagnoses Final diagnoses:  None     '@PCDICTATION'$ @    Teressa Lower, MD 06/18/22 1248

## 2022-06-19 LAB — GLUCOSE, CAPILLARY
Glucose-Capillary: 92 mg/dL (ref 70–99)
Glucose-Capillary: 92 mg/dL (ref 70–99)
Glucose-Capillary: 115 mg/dL — ABNORMAL HIGH (ref 70–99)
Glucose-Capillary: 119 mg/dL — ABNORMAL HIGH (ref 70–99)

## 2022-06-19 LAB — PROTIME-INR
INR: 2.4 — ABNORMAL HIGH (ref 0.8–1.2)
Prothrombin Time: 25.6 seconds — ABNORMAL HIGH (ref 11.4–15.2)

## 2022-06-19 NOTE — Progress Notes (Signed)
Rounding Note    Patient Name: Renee Payne Date of Encounter: 06/19/2022  [place] Cardiologist: None   Subjective   Currently feeling well without chest pain or shortness of breath  Inpatient Medications    Scheduled Meds:  amLODipine  5 mg Oral Daily   aspirin EC  81 mg Oral Daily   cyanocobalamin  1,000 mcg Oral Daily   insulin aspart  0-20 Units Subcutaneous TID WC   insulin aspart  0-5 Units Subcutaneous QHS   irbesartan  150 mg Oral Daily   PARoxetine  10 mg Oral Daily   Continuous Infusions:  PRN Meds: acetaminophen, docusate sodium, ondansetron (ZOFRAN) IV, polyethylene glycol   Vital Signs    Vitals:   06/19/22 0024 06/19/22 0422 06/19/22 0822 06/19/22 1133  BP: (!) 160/68 (!) 131/52 (!) 156/70 (!) 173/69  Pulse: (!) 45 (!) 46 (!) 45 (!) 42  Resp: '16 15 14 17  '$ Temp: 99.2 F (37.3 C) 99.1 F (37.3 C) 98.5 F (36.9 C) 99 F (37.2 C)  TempSrc: Oral Oral Oral Oral  SpO2: 94% 99% 97% 96%  Weight:      Height:        Intake/Output Summary (Last 24 hours) at 06/19/2022 1140 Last data filed at 06/19/2022 0825 Gross per 24 hour  Intake 240 ml  Output --  Net 240 ml      06/18/2022    5:00 AM 06/17/2022   11:00 PM 06/17/2022    6:15 PM  Last 3 Weights  Weight (lbs) 179 lb 7.3 oz 179 lb 7.3 oz 185 lb  Weight (kg) 81.4 kg 81.4 kg 83.915 kg      Telemetry    Sinus rhythm, complete heart block- Personally Reviewed  ECG    None new- Personally Reviewed  Physical Exam   GEN: No acute distress.   Neck: No JVD Cardiac: Bradycardic, no murmurs, rubs, or gallops.  Respiratory: Clear to auscultation bilaterally. GI: Soft, nontender, non-distended  MS: No edema; No deformity. Neuro:  Nonfocal  Psych: Normal affect   Labs    High Sensitivity Troponin:   Recent Labs  Lab 06/17/22 1842 06/17/22 2044  TROPONINIHS 28* 31*     Chemistry Recent Labs  Lab 06/17/22 1842 06/18/22 0626  NA 138 140  K 3.9 3.7  CL 106 111  CO2 22 24   GLUCOSE 95 91  BUN 24* 25*  CREATININE 2.06* 1.96*  CALCIUM 9.4 8.9  GFRNONAA 25* 27*  ANIONGAP 10 5    Lipids No results for input(s): "CHOL", "TRIG", "HDL", "LABVLDL", "LDLCALC", "CHOLHDL" in the last 168 hours.  Hematology Recent Labs  Lab 06/17/22 1842  WBC 4.3  RBC 3.00*  HGB 8.8*  HCT 28.0*  MCV 93.3  MCH 29.3  MCHC 31.4  RDW 15.4  PLT 187   Thyroid No results for input(s): "TSH", "FREET4" in the last 168 hours.  BNP Recent Labs  Lab 06/17/22 1842  BNP 1,111.3*    DDimer No results for input(s): "DDIMER" in the last 168 hours.   Radiology    DG Ankle Complete Left  Result Date: 06/17/2022 CLINICAL DATA:  Ankle pain and swelling, initial encounter EXAM: LEFT ANKLE COMPLETE - 3+ VIEW COMPARISON:  02/06/2018 FINDINGS: Considerable soft tissue swelling is noted about the left ankle. No acute fracture or dislocation is noted. Tarsal degenerative changes are seen as well as calcaneal spurring. IMPRESSION: Soft tissue swelling without acute bony abnormality. Electronically Signed   By: Inez Catalina  M.D.   On: 06/17/2022 19:30   DG Chest 2 View  Result Date: 06/17/2022 CLINICAL DATA:  Chest pain EXAM: CHEST - 2 VIEW COMPARISON:  05/25/22 FINDINGS: Cardiac shadow is within normal limits. Tortuous thoracic aorta is again seen. The lungs are clear bilaterally. No acute bony abnormality is noted. IMPRESSION: No active cardiopulmonary disease. Electronically Signed   By: Inez Catalina M.D.   On: 06/17/2022 19:29    Cardiac Studies   Echo pending  Patient Profile     72 y.o. female presented to the hospital with complete heart block and complaint of weakness and fatigue.  Assessment & Plan    Complete heart block: Plan for pacemaker implant tomorrow.  Echo currently pending.  Risk and benefits have been discussed.  Hypertension: Blood pressure elevated today.  Helyn Schwan likely improve post pacemaker implant.  No indication for therapy currently.  Acute renal failure: Creatinine  elevated from baseline.  Tasheba Henson need to reassess post pacemaker implant.     For questions or updates, please contact [place] Please consult [email or web address] for contact info under        Signed, Yamen Castrogiovanni Meredith Leeds, MD  06/19/2022, 11:40 AM

## 2022-06-20 ENCOUNTER — Inpatient Hospital Stay (HOSPITAL_COMMUNITY): Payer: Medicare HMO

## 2022-06-20 ENCOUNTER — Inpatient Hospital Stay (HOSPITAL_COMMUNITY)
Admission: EM | Disposition: A | Discharge status: Home / Self Care | Payer: Self-pay | Source: Home / Self Care | Attending: Internal Medicine

## 2022-06-20 DIAGNOSIS — I442 Atrioventricular block, complete: Secondary | ICD-10-CM

## 2022-06-20 HISTORY — PX: PACEMAKER IMPLANT: EP1218

## 2022-06-20 LAB — BASIC METABOLIC PANEL
Anion gap: 10 (ref 5–15)
BUN: 27 mg/dL — ABNORMAL HIGH (ref 8–23)
CO2: 21 mmol/L — ABNORMAL LOW (ref 22–32)
Calcium: 9.2 mg/dL (ref 8.9–10.3)
Chloride: 110 mmol/L (ref 98–111)
Creatinine, Ser: 2.08 mg/dL — ABNORMAL HIGH (ref 0.44–1.00)
GFR, Estimated: 25 mL/min — ABNORMAL LOW (ref >60)
Glucose, Bld: 84 mg/dL (ref 70–99)
Potassium: 3.6 mmol/L (ref 3.5–5.1)
Sodium: 141 mmol/L (ref 135–145)

## 2022-06-20 LAB — GLUCOSE, CAPILLARY
Glucose-Capillary: 83 mg/dL (ref 70–99)
Glucose-Capillary: 90 mg/dL (ref 70–99)
Glucose-Capillary: 81 mg/dL (ref 70–99)
Glucose-Capillary: 173 mg/dL — ABNORMAL HIGH (ref 70–99)

## 2022-06-20 LAB — ECHOCARDIOGRAM COMPLETE
Area-P 1/2: 2.39 cm2
Height: 61 in
S' Lateral: 3.1 cm
Weight: 2846.58 oz

## 2022-06-20 LAB — CBC
HCT: 27.8 % — ABNORMAL LOW (ref 36.0–46.0)
Hemoglobin: 8.8 g/dL — ABNORMAL LOW (ref 12.0–15.0)
MCH: 28.8 pg (ref 26.0–34.0)
MCHC: 31.7 g/dL (ref 30.0–36.0)
MCV: 90.8 fL (ref 80.0–100.0)
Platelets: 198 10*3/uL (ref 150–400)
RBC: 3.06 MIL/uL — ABNORMAL LOW (ref 3.87–5.11)
RDW: 15.4 % (ref 11.5–15.5)
WBC: 3.5 10*3/uL — ABNORMAL LOW (ref 4.0–10.5)
nRBC: 0 % (ref 0.0–0.2)

## 2022-06-20 LAB — PROTIME-INR
INR: 1.6 — ABNORMAL HIGH (ref 0.8–1.2)
Prothrombin Time: 18.7 seconds — ABNORMAL HIGH (ref 11.4–15.2)

## 2022-06-20 LAB — HEMOGLOBIN A1C
Hgb A1c MFr Bld: 6.3 % — ABNORMAL HIGH (ref 4.8–5.6)
Mean Plasma Glucose: 134 mg/dL

## 2022-06-20 SURGERY — PACEMAKER IMPLANT

## 2022-06-20 MED ORDER — SODIUM CHLORIDE 0.9 % IV SOLN
80.0000 mg | INTRAVENOUS | Status: AC
  Administered 2022-06-20: 80 mg

## 2022-06-20 MED ORDER — CEFAZOLIN SODIUM-DEXTROSE 2-4 GM/100ML-% IV SOLN
2.0000 g | INTRAVENOUS | Status: AC
  Administered 2022-06-20: 2 g via INTRAVENOUS
  Filled 2022-06-20: qty 100

## 2022-06-20 MED ORDER — LIDOCAINE HCL (PF) 1 % IJ SOLN
INTRAMUSCULAR | Status: DC | PRN
  Administered 2022-06-20: 60 mL

## 2022-06-20 MED ORDER — HYDRALAZINE HCL 20 MG/ML IJ SOLN
10.0000 mg | INTRAMUSCULAR | Status: DC | PRN
  Administered 2022-06-20: 10 mg via INTRAVENOUS
  Filled 2022-06-20: qty 1

## 2022-06-20 MED ORDER — SODIUM CHLORIDE 0.9 % IV SOLN: INTRAVENOUS | Status: DC

## 2022-06-20 MED ORDER — CEFAZOLIN SODIUM-DEXTROSE 2-4 GM/100ML-% IV SOLN
INTRAVENOUS | Status: AC
  Filled 2022-06-20: qty 100

## 2022-06-20 MED ORDER — ROSUVASTATIN CALCIUM 20 MG PO TABS
20.0000 mg | ORAL_TABLET | Freq: Every day | ORAL | Status: DC
  Administered 2022-06-20 – 2022-06-21 (×2): 20 mg via ORAL
  Filled 2022-06-20 (×2): qty 1

## 2022-06-20 MED ORDER — GENTAMICIN SULFATE 40 MG/ML IJ SOLN
INTRAMUSCULAR | Status: AC
  Filled 2022-06-20: qty 2

## 2022-06-20 MED ORDER — FENTANYL CITRATE (PF) 100 MCG/2ML IJ SOLN
INTRAMUSCULAR | Status: AC
  Filled 2022-06-20: qty 2

## 2022-06-20 MED ORDER — CHLORHEXIDINE GLUCONATE 4 % EX LIQD
60.0000 mL | Freq: Once | CUTANEOUS | Status: AC
  Administered 2022-06-20: 4 via TOPICAL
  Filled 2022-06-20: qty 15

## 2022-06-20 MED ORDER — LIDOCAINE HCL (PF) 1 % IJ SOLN
INTRAMUSCULAR | Status: AC
Start: 2022-06-20 — End: ?
  Filled 2022-06-20: qty 60

## 2022-06-20 MED ORDER — HEPARIN (PORCINE) IN NACL 1000-0.9 UT/500ML-% IV SOLN
INTRAVENOUS | Status: DC | PRN
  Administered 2022-06-20: 500 mL

## 2022-06-20 MED ORDER — LIDOCAINE HCL 1 % IJ SOLN
INTRAMUSCULAR | Status: AC
Start: 2022-06-20 — End: ?
  Filled 2022-06-20: qty 20

## 2022-06-20 MED ORDER — CHLORHEXIDINE GLUCONATE 4 % EX LIQD
CUTANEOUS | Status: AC
  Filled 2022-06-20: qty 60

## 2022-06-20 MED ORDER — CHLORHEXIDINE GLUCONATE 4 % EX LIQD
60.0000 mL | Freq: Once | CUTANEOUS | Status: DC
  Filled 2022-06-20: qty 60

## 2022-06-20 SURGICAL SUPPLY — 13 items
CABLE SURGICAL S-101-97-12 (CABLE) ×1 IMPLANT
CATH CPS LOCATOR 3D MED (CATHETERS) IMPLANT
HELIX LOCKING TOOL (MISCELLANEOUS) ×1
LEAD ULTIPACE 52 LPA1231/52 (Lead) IMPLANT
LEAD ULTIPACE 65 LPA1231/65 (Lead) IMPLANT
PACEMAKER ASSURITY DR-RF (Pacemaker) IMPLANT
PAD DEFIB RADIO PHYSIO CONN (PAD) ×1 IMPLANT
SHEATH 7FR PRELUDE SNAP 13 (SHEATH) IMPLANT
SHEATH 9FR PRELUDE SNAP 13 (SHEATH) IMPLANT
SLITTER AGILIS HISPRO (INSTRUMENTS) IMPLANT
TOOL HELIX LOCKING (MISCELLANEOUS) IMPLANT
TRAY PACEMAKER INSERTION (PACKS) ×1 IMPLANT
WIRE HI TORQ VERSACORE-J 145CM (WIRE) IMPLANT

## 2022-06-20 NOTE — Care Management Important Message (Signed)
Important Message  Patient Details  Name: Renee Payne MRN: [id number] Date of Birth: [birth year]   Medicare Important Message Given:  Yes     Venda Dice Montine Circle 06/20/2022, 2:39 PM

## 2022-06-20 NOTE — Progress Notes (Cosign Needed)
  Transition of Care [facility]) Screening Note   Patient Details  Name: Renee Payne Date of Birth: [birth year]   Transition of Care [facility]) CM/SW Contact:    Cyndi Bender, RN Phone Number: 06/20/2022, 9:13 AM    Transition of Care Department [facility]) has reviewed patient and no TOC needs have been identified at this time. We will continue to monitor patient advancement through interdisciplinary progression rounds. If new patient transition needs arise, please place a TOC consult.

## 2022-06-20 NOTE — Progress Notes (Signed)
  Echocardiogram 2D Echocardiogram has been performed.  Renee Payne 06/20/2022, 9:37 AM

## 2022-06-20 NOTE — Progress Notes (Signed)
Orthopedic Tech Progress Note Patient Details:  Renee Payne [birth year] [id number]  Patient ID: [name], female   DOB: 04/08/1951, 72 y.o.   MRN: [id number] I spoke with the RN they said patient has arm sling on. Karolee Stamps 06/20/2022, 8:08 PM

## 2022-06-20 NOTE — Progress Notes (Signed)
Rounding Note    Patient Name: Renee Payne Date of Encounter: 06/20/2022  [place] Cardiologist: None   Subjective   NAEON.  Tired this AM, got dizzy when transferring to bed-side commode  Inpatient Medications    Scheduled Meds:  amLODipine  5 mg Oral Daily   aspirin EC  81 mg Oral Daily   cyanocobalamin  1,000 mcg Oral Daily   insulin aspart  0-20 Units Subcutaneous TID WC   insulin aspart  0-5 Units Subcutaneous QHS   irbesartan  150 mg Oral Daily   PARoxetine  10 mg Oral Daily   Continuous Infusions:  PRN Meds: acetaminophen, docusate sodium, ondansetron (ZOFRAN) IV, polyethylene glycol   Vital Signs    Vitals:   06/20/22 0017 06/20/22 0041 06/20/22 0427 06/20/22 0448  BP: (!) 191/66 (!) 161/59 138/61   Pulse: (!) 44 (!) 43 (!) 43   Resp: '20 15 20   '$ Temp: 99.2 F (37.3 C)  98.6 F (37 C)   TempSrc: Oral  Oral   SpO2: 94%     Weight:    80.7 kg  Height:        Intake/Output Summary (Last 24 hours) at 06/20/2022 0842 Last data filed at 06/20/2022 0448 Gross per 24 hour  Intake 480 ml  Output 800 ml  Net -320 ml       06/20/2022    4:48 AM 06/19/2022    1:11 PM 06/18/2022    5:00 AM  Last 3 Weights  Weight (lbs) 177 lb 14.6 oz 180 lb 12.4 oz 179 lb 7.3 oz  Weight (kg) 80.7 kg 82 kg 81.4 kg      Telemetry    2:1 HB, rates in 40s, 8 beat run VT yesterday afternoon - Personally Reviewed  ECG    None new- Personally Reviewed  Physical Exam   GEN: No acute distress, sleeping in bed  Neck: No JVD Cardiac: Bradycardic, no murmurs, rubs, or gallops.  Respiratory: Clear to auscultation bilaterally. GI: Soft, nontender, non-distended  MS: No edema; No deformity. Neuro:  Nonfocal  Psych: Normal affect   Labs    High Sensitivity Troponin:   Recent Labs  Lab 06/17/22 1842 06/17/22 2044  TROPONINIHS 28* 31*      Chemistry Recent Labs  Lab 06/17/22 1842 06/18/22 0626  NA 138 140  K 3.9 3.7  CL 106 111  CO2 22 24   GLUCOSE 95 91  BUN 24* 25*  CREATININE 2.06* 1.96*  CALCIUM 9.4 8.9  GFRNONAA 25* 27*  ANIONGAP 10 5     Lipids No results for input(s): "CHOL", "TRIG", "HDL", "LABVLDL", "LDLCALC", "CHOLHDL" in the last 168 hours.  Hematology Recent Labs  Lab 06/17/22 1842  WBC 4.3  RBC 3.00*  HGB 8.8*  HCT 28.0*  MCV 93.3  MCH 29.3  MCHC 31.4  RDW 15.4  PLT 187    Thyroid No results for input(s): "TSH", "FREET4" in the last 168 hours.  BNP Recent Labs  Lab 06/17/22 1842  BNP 1,111.3*     DDimer No results for input(s): "DDIMER" in the last 168 hours.   Radiology    No results found.  Cardiac Studies   Echo pending  Patient Profile     72 y.o. female presented to the hospital with complete heart block and complaint of weakness and fatigue.  PMH of diabetes, HTN, CKD stage 3, h/o PE on coumadin. She has been having more frequent shortness of breath over the last few  days. She did have an episode of nausea and vomiting. She discussed this with her primary physician who recommended being evaluated in the emergency room. Once in the emergency room, she was found to be in complete heart block with heart rates in the 40s. She feels well in bed, but does feel short of breath when she is ambulating.   Assessment & Plan    #) CHB Plan for pacemaker today Echo pending, to be done prior to PPM Risks and benefits discussed with patient. She verbalized understanding and wished to proceed with procedure.   #) HTN Blood pressure remain elevated.  Will likely improve post pacemaker implant.  No indication for therapy currently.  #) AKI on CKD Creatinine elevated from baseline.  Will need to reassess post pacemaker implant.    #) H/o PE, chronic anticoag INR 2.4  yesterday Will update this AM pre-procedure    For questions or updates, please contact [place] Please consult [email or web address] for contact info under        Signed, Mamie Levers, NP  06/20/2022, 8:42 AM

## 2022-06-21 ENCOUNTER — Inpatient Hospital Stay (HOSPITAL_COMMUNITY): Payer: Medicare HMO

## 2022-06-21 ENCOUNTER — Encounter (HOSPITAL_COMMUNITY): Payer: Self-pay | Admitting: Cardiology

## 2022-06-21 LAB — GLUCOSE, CAPILLARY: Glucose-Capillary: 94 mg/dL (ref 70–99)

## 2022-06-21 MED ORDER — WARFARIN SODIUM 5 MG PO TABS: 2.5000 mg | ORAL_TABLET | ORAL | 0 refills | Status: DC

## 2022-06-21 MED ORDER — ACETAMINOPHEN 325 MG PO TABS: 650.0000 mg | ORAL_TABLET | ORAL | Status: DC | PRN

## 2022-06-21 MED FILL — Fentanyl Citrate Preservative Free (PF) Inj 100 MCG/2ML: INTRAMUSCULAR | Qty: 2 | Status: AC

## 2022-06-21 NOTE — Discharge Instructions (Signed)
After Your Pacemaker   You have a Abbott Pacemaker  ACTIVITY Do not lift your arm above shoulder height for 1 week after your procedure. After 7 days, you may progress as below.  You should remove your sling 24 hours after your procedure, unless otherwise instructed by your provider.     Tuesday June 28, 2022  Wednesday June 29, 2022 Thursday June 30, 2022 Friday July 01, 2022   Do not lift, push, pull, or carry anything over 10 pounds with the affected arm until 6 weeks (Tuesday August 02, 2022 ) after your procedure.   You may drive AFTER your wound check, unless you have been told otherwise by your provider.   Ask your healthcare provider when you can go back to work   INCISION/Dressing If you are on a blood thinner such as Coumadin, Xarelto, Eliquis, Plavix, or Pradaxa please confirm with your provider when this should be resumed.  RESUME COUMADIN FRIDAY, 3/8. SCHEDULE APPOINTMENT IN COUMADIN CLINIC NEXT WEEK  If large square, outer bandage is left in place, this can be removed after 24 hours from your procedure. Do not remove steri-strips or glue as below.   Monitor your Pacemaker site for redness, swelling, and drainage. Call the device clinic at [phone] if you experience these symptoms or fever/chills.  Pressure dressing in place - do not remove .    If you were discharged in a sling, please do not wear this during the day more than 48 hours after your surgery unless otherwise instructed. This may increase the risk of stiffness and soreness in your shoulder.   Avoid lotions, ointments, or perfumes over your incision until it is well-healed.  You may use a hot tub or a pool AFTER your wound check appointment if the incision is completely closed.  Pacemaker Alerts:  Some alerts are vibratory and others beep. These are NOT emergencies. Please call our office to let us know. If this occurs at night or on weekends, it can wait until the next business day. Send a remote  transmission.  If your device is capable of reading fluid status (for heart failure), you will be offered monthly monitoring to review this with you.   DEVICE MANAGEMENT Remote monitoring is used to monitor your pacemaker from home. This monitoring is scheduled every 91 days by our office. It allows [place] to keep an eye on the functioning of your device to ensure it is working properly. You will routinely see your Electrophysiologist annually (more often if necessary).   You should receive your ID card for your new device in 4-8 weeks. Keep this card with you at all times once received. Consider wearing a medical alert bracelet or necklace.  Your Pacemaker may be MRI compatible. This will be discussed at your next office visit/wound check.  You should avoid contact with strong electric or magnetic fields.   Do not use amateur (ham) radio equipment or electric (arc) welding torches. MP3 player headphones with magnets should not be used. Some devices are safe to use if held at least 12 inches (30 cm) from your Pacemaker. These include power tools, lawn mowers, and speakers. If you are unsure if something is safe to use, ask your health care provider.  When using your cell phone, hold it to the ear that is on the opposite side from the Pacemaker. Do not leave your cell phone in a pocket over the Pacemaker.  You may safely use electric blankets, heating pads, computers, and microwave ovens.  Call the office right away if: You have chest pain. You feel more short of breath than you have felt before. You feel more light-headed than you have felt before. Your incision starts to open up.  This information is not intended to replace advice given to you by your health care provider. Make sure you discuss any questions you have with your health care provider.

## 2022-06-21 NOTE — Discharge Summary (Signed)
ELECTROPHYSIOLOGY PROCEDURE DISCHARGE SUMMARY    Patient ID: Renee Payne,  MRN: [id number], DOB/AGE: [birth year] 74 y.o.  Admit date: 06/17/2022 Discharge date: 06/21/2022  Primary Care Physician: [place]  Primary Cardiologist: None  Electrophysiologist: Dr. Quentin Ore   Primary Discharge Diagnosis:  Symptomatic bradycardia status post pacemaker implantation this admission  Secondary Discharge Diagnosis:  HTN CKD H/o PE on chronic anticoagulation  Allergies  Allergen Reactions   Ativan [Lorazepam] Other (See Comments)    COMBATIVE AND EXTREMELY VIOLENT   Omeprazole Other (See Comments)    The front of her body felt very warm after one dose of this med.     Procedures This Admission:  1.  Implantation of a Abbott Dual Chamber PPM on 06/20/2022 by Dr. Quentin Ore. The patient received a Abbott Assurity [id number] with a Abbott Ultipace 1231-52 right atrial lead and a Abbott Ultipace 1231-65 right ventricular lead.  There were no immediate post procedure complications.   2.  CXR on 06/21/2022 demonstrated no pneumothorax status post device implantation.       Brief HPI: Renee Payne is a 72 y.o. female was admitted for worsening SOB and electrophysiology team asked to see for consideration of PPM implantation.  Past medical history includes T2DM, HTN, CKD stage 3, h/o PE on chronic anticoagulation (warfarin).  The patient has had AV block without reversible causes identified.  Risks, benefits, and alternatives to PPM implantation were reviewed with the patient who wished to proceed.   Hospital Course:  The patient was admitted and underwent implantation of a Abbott dual chamber PPM with details as outlined above.  She was monitored on telemetry overnight which demonstrated appropriate pacing.  Left chest was without hematoma or ecchymosis.  The device was interrogated and found to be functioning normally.  CXR was obtained and demonstrated no pneumothorax  status post device implantation.  Wound care, arm mobility, and restrictions were reviewed with the patient.  She has a left chest pressure dressing in place, to be removed in clinic later this week. The patient was examined and considered stable for discharge to home.    Anticoagulation resumption This patient should resume their Coumadin on Friday June 24, 2022. She should follow-up in coumadin clinic next week for INR check.    Physical Exam: Vitals:   06/20/22 2137 06/20/22 2209 06/21/22 0023 06/21/22 0345  BP: (!) 180/87 (!) 158/86 135/78 (!) 159/89  Pulse:      Resp:    (!) 22  Temp:   97.8 F (36.6 C) 98.5 F (36.9 C)  TempSrc:   Oral Oral  SpO2:    95%  Weight:    80.1 kg  Height:        GEN- NAD. A&O x 3.  HEENT: Normocephalic, atraumatic Lungs- CTAB, Normal effort.  Heart- RRR, No M/G/R.  GI- Soft, NT, ND.  Extremities- No clubbing, cyanosis, or edema;  Skin- warm and dry, no rash or lesion, left chest without hematoma/ecchymosis. Pressure dressing c/d/I, to be left in place until outpatient follow-up appointment.   Discharge Medications:  Allergies as of 06/21/2022       Reactions   Ativan [lorazepam] Other (See Comments)   COMBATIVE AND EXTREMELY VIOLENT   Omeprazole Other (See Comments)   The front of her body felt very warm after one dose of this med.        Medication List     TAKE these medications    acetaminophen 325 MG tablet Commonly known as:  TYLENOL Take 2 tablets (650 mg total) by mouth every 4 (four) hours as needed for headache or mild pain.   amLODipine 5 MG tablet Commonly known as: NORVASC Take 5 mg by mouth daily.   aspirin EC 81 MG tablet Take 1 tablet (81 mg total) by mouth daily.   Colchicine 0.6 MG Caps Take 0.6-1.2 mg by mouth See admin instructions. Take 1.2 mg once in the morning then 0.6 mg that night on the first day. Then take 0.6 mg once daily as needed for a gout flare up.   cyanocobalamin 1000 MCG tablet Commonly  known as: VITAMIN B12 Take 1 tablet (1,000 mcg total) by mouth daily.   EYE LUBRICANT OP Place 2 drops into both eyes as needed (dry eye).   hydrochlorothiazide 12.5 MG tablet Commonly known as: HYDRODIURIL Take 12.5 mg by mouth daily.   losartan 50 MG tablet Commonly known as: COZAAR Take 50 mg by mouth daily.   metFORMIN 1000 MG tablet Commonly known as: GLUCOPHAGE Take 1,000 mg by mouth daily. What changed: Another medication with the same name was removed. Continue taking this medication, and follow the directions you see here.   PARoxetine 10 MG tablet Commonly known as: PAXIL Take 10 mg by mouth daily.   rosuvastatin 20 MG tablet Commonly known as: CRESTOR Take 20 mg by mouth at bedtime.   valsartan 160 MG tablet Commonly known as: DIOVAN Take 160 mg by mouth daily.   warfarin 5 MG tablet Commonly known as: COUMADIN Take 0.5-1 tablets (2.5-5 mg total) by mouth See admin instructions. Take 2.'5mg'$  every evening except for '5mg'$  on Monday and Fridays Start taking on: June 24, 2022 What changed: These instructions start on June 24, 2022. If you are unsure what to do until then, ask your doctor or other care provider.        Disposition:    Follow-up Information     [facility] Go on 06/24/2022.   Specialty: Cardiology Why: 3/8 at 12:20 Contact information: [address] [place] [id number] [place] [address] [phone]        coumadin (warfarin) clinic Follow up.   Why: You need to call your coumadin (warfarin) clinic to schedule a INR (lab) check 3-4 days after restarting the warfarin as we discussed. (March 11th or 12th)                Duration of Discharge Encounter: Greater than 30 minutes including physician time.  Signed, Mamie Levers, NP  06/21/2022 10:23 AM

## 2022-06-21 NOTE — Plan of Care (Signed)
  Problem: Education: Goal: Ability to describe self-care measures that may prevent or decrease complications (Diabetes Survival Skills Education) will improve Outcome: Progressing Goal: Individualized Educational Video(s) Outcome: Progressing   Problem: Coping: Goal: Ability to adjust to condition or change in health will improve Outcome: Progressing   Problem: Fluid Volume: Goal: Ability to maintain a balanced intake and output will improve Outcome: Progressing   Problem: Health Behavior/Discharge Planning: Goal: Ability to identify and utilize available resources and services will improve Outcome: Progressing Goal: Ability to manage health-related needs will improve Outcome: Progressing   Problem: Metabolic: Goal: Ability to maintain appropriate glucose levels will improve Outcome: Progressing   Problem: Nutritional: Goal: Maintenance of adequate nutrition will improve Outcome: Progressing Goal: Progress toward achieving an optimal weight will improve Outcome: Progressing   Problem: Skin Integrity: Goal: Risk for impaired skin integrity will decrease Outcome: Progressing   Problem: Tissue Perfusion: Goal: Adequacy of tissue perfusion will improve Outcome: Progressing   Problem: Education: Goal: Knowledge of General Education information will improve Description: Including pain rating scale, medication(s)/side effects and non-pharmacologic comfort measures Outcome: Progressing   Problem: Health Behavior/Discharge Planning: Goal: Ability to manage health-related needs will improve Outcome: Progressing   Problem: Clinical Measurements: Goal: Ability to maintain clinical measurements within normal limits will improve Outcome: Progressing Goal: Will remain free from infection Outcome: Progressing Goal: Diagnostic test results will improve Outcome: Progressing Goal: Respiratory complications will improve Outcome: Progressing Goal: Cardiovascular complication will  be avoided Outcome: Progressing   Problem: Activity: Goal: Risk for activity intolerance will decrease Outcome: Progressing   Problem: Nutrition: Goal: Adequate nutrition will be maintained Outcome: Progressing   Problem: Coping: Goal: Level of anxiety will decrease Outcome: Progressing   Problem: Elimination: Goal: Will not experience complications related to bowel motility Outcome: Progressing Goal: Will not experience complications related to urinary retention Outcome: Progressing   Problem: Pain Managment: Goal: General experience of comfort will improve Outcome: Progressing   Problem: Safety: Goal: Ability to remain free from injury will improve Outcome: Progressing   Problem: Skin Integrity: Goal: Risk for impaired skin integrity will decrease Outcome: Progressing   Problem: Education: Goal: Knowledge of cardiac device and self-care will improve Outcome: Progressing Goal: Ability to safely manage health related needs after discharge will improve Outcome: Progressing Goal: Individualized Educational Video(s) Outcome: Progressing   Problem: Cardiac: Goal: Ability to achieve and maintain adequate cardiopulmonary perfusion will improve Outcome: Progressing

## 2022-06-24 ENCOUNTER — Encounter: Payer: Self-pay | Admitting: Student

## 2022-06-24 ENCOUNTER — Ambulatory Visit: Payer: Medicare HMO | Attending: Student | Admitting: Student

## 2022-06-24 VITALS — Ht 61.0 in | Wt 176.0 lb

## 2022-06-24 DIAGNOSIS — I442 Atrioventricular block, complete: Secondary | ICD-10-CM

## 2022-06-24 NOTE — Progress Notes (Signed)
Pt presented to clinic to have pressure dressing removed.   They misinterpreted instructions and removed this the day after her procedure.   Tegaderm removed.  Steri-strips in place with no ecchymosis, drainage, or bleeding.   Pt OK to resume coumadin tonight as previously directed.   Keep Device clinic Wound check 3/20 as scheduled. Call with any concerns in the interim.   Re-reviewed wound care and arm/lifting restrictions.   Legrand Como [address] [place], [place]  06/24/2022 12:39 PM

## 2022-06-24 NOTE — Patient Instructions (Signed)
Medication Instructions:  Your physician recommends that you continue on your current medications as directed. Please refer to the Current Medication list given to you today.  *If you need a refill on your cardiac medications before your next appointment, please call your pharmacy*   Lab Work: None If you have labs (blood work) drawn today and your tests are completely normal, you will receive your results only by: [place] (if you have MyChart) OR A paper copy in the mail If you have any lab test that is abnormal or we need to change your treatment, we will call you to review the results.   Follow-Up: At [facility], you and your health needs are our priority.  As part of our continuing mission to provide you with exceptional heart care, we have created designated Provider Care Teams.  These Care Teams include your primary Cardiologist (physician) and Advanced Practice Providers (APPs -  Physician Assistants and Nurse Practitioners) who all work together to provide you with the care you need, when you need it.  We recommend signing up for the patient portal called "MyChart".  Sign up information is provided on this After Visit Summary.  MyChart is used to connect with patients for Virtual Visits (Telemedicine).  Patients are able to view lab/test results, encounter notes, upcoming appointments, etc.  Non-urgent messages can be sent to your provider as well.   To learn more about what you can do with MyChart, go to [email or web address].    Your next appointment:   09/23/22 at 3:15 PM  Provider:   Lars Mage, MD

## 2022-07-06 ENCOUNTER — Ambulatory Visit: Payer: Medicare HMO | Attending: Internal Medicine

## 2022-07-06 DIAGNOSIS — I442 Atrioventricular block, complete: Secondary | ICD-10-CM

## 2022-07-06 NOTE — Patient Instructions (Signed)
   After Your Pacemaker   Monitor your pacemaker site for redness, swelling, and drainage. Call the device clinic at [phone] if you experience these symptoms or fever/chills.  Your incision was closed with Steri-strips or staples:  You may shower 7 days after your procedure and wash your incision with soap and water. Avoid lotions, ointments, or perfumes over your incision until it is well-healed.  You may use a hot tub or a pool after your wound check appointment if the incision is completely closed.  Do not lift, push or pull greater than 10 pounds with the affected arm until 6 weeks after your procedure.  UNTIL AFTER APRIL 15TH There are no other restrictions in arm movement after your wound check appointment.  You may drive, unless driving has been restricted by your healthcare providers.  Remote monitoring is used to monitor your pacemaker from home. This monitoring is scheduled every 91 days by our office. It allows [place] to keep an eye on the functioning of your device to ensure it is working properly. You will routinely see your Electrophysiologist annually (more often if necessary).

## 2022-07-08 LAB — CUP PACEART INCLINIC DEVICE CHECK
Battery Remaining Longevity: 66 mo
Battery Voltage: 3.02 V
Brady Statistic RA Percent Paced: 2.9 %
Brady Statistic RV Percent Paced: 99.81 %
Date Time Interrogation Session: 20240320152800
Implantable Lead Connection Status: 753985
Implantable Lead Connection Status: 753985
Implantable Lead Implant Date: 20240304
Implantable Lead Implant Date: 20240304
Implantable Lead Location: 753859
Implantable Lead Location: 753860
Implantable Pulse Generator Implant Date: 20240304
Lead Channel Impedance Value: 475 Ohm
Lead Channel Impedance Value: 487.5 Ohm
Lead Channel Pacing Threshold Amplitude: 0.5 V
Lead Channel Pacing Threshold Amplitude: 0.5 V
Lead Channel Pacing Threshold Amplitude: 1.25 V
Lead Channel Pacing Threshold Amplitude: 1.25 V
Lead Channel Pacing Threshold Pulse Width: 0.5 ms
Lead Channel Pacing Threshold Pulse Width: 0.5 ms
Lead Channel Pacing Threshold Pulse Width: 0.5 ms
Lead Channel Pacing Threshold Pulse Width: 0.5 ms
Lead Channel Sensing Intrinsic Amplitude: 12 mV
Lead Channel Sensing Intrinsic Amplitude: 5 mV
Lead Channel Setting Pacing Amplitude: 3.5 V
Lead Channel Setting Pacing Amplitude: 3.5 V
Lead Channel Setting Pacing Pulse Width: 0.5 ms
Lead Channel Setting Sensing Sensitivity: 4 mV
Pulse Gen Model: 2272
Pulse Gen Serial Number: 8164109

## 2022-07-08 NOTE — Progress Notes (Signed)
Wound check appointment. Steri-strips removed. Wound without redness or edema. Incision edges approximated, wound well healed. Normal device function. Thresholds, sensing, and impedances consistent with implant measurements. Device programmed at 3.5V for extra safety margin until 3 month visit. Histogram distribution appropriate for patient and level of activity. AT/AF burden <1%, 4 AMS appear AT-Flutter rates, no high ventricular rates noted. Patient experiencing new onset SOB with exertion, HA and dizziness today.  Dr. Quentin Ore reviewed with patient.  She feels she may be coming down with a cold, patient to follow up with her PCP.  Patient educated about wound care, arm mobility, lifting restrictions. ROV in 3 months with implanting physician. PROGRAMMING CHANGE MADE reviewed with Dr. Quentin Ore:  RV THRESHOLD decreased from 5.0v@.5 to 3.5@.5.

## 2022-07-28 ENCOUNTER — Inpatient Hospital Stay (HOSPITAL_COMMUNITY): Payer: Medicare HMO

## 2022-07-28 ENCOUNTER — Emergency Department (HOSPITAL_COMMUNITY): Payer: Medicare HMO

## 2022-07-28 ENCOUNTER — Encounter (HOSPITAL_COMMUNITY): Payer: Self-pay

## 2022-07-28 ENCOUNTER — Other Ambulatory Visit: Payer: Self-pay

## 2022-07-28 ENCOUNTER — Inpatient Hospital Stay (HOSPITAL_COMMUNITY)
Admission: EM | Admit: 2022-07-28 | Discharge: 2022-07-31 | DRG: 760 | Disposition: A | Discharge status: Home Health | Payer: Medicare HMO | Attending: Internal Medicine | Admitting: Internal Medicine

## 2022-07-28 DIAGNOSIS — Z888 Allergy status to other drugs, medicaments and biological substances status: Secondary | ICD-10-CM | POA: Diagnosis not present

## 2022-07-28 DIAGNOSIS — Z833 Family history of diabetes mellitus: Secondary | ICD-10-CM

## 2022-07-28 DIAGNOSIS — Z86711 Personal history of pulmonary embolism: Secondary | ICD-10-CM | POA: Diagnosis not present

## 2022-07-28 DIAGNOSIS — I129 Hypertensive chronic kidney disease with stage 1 through stage 4 chronic kidney disease, or unspecified chronic kidney disease: Secondary | ICD-10-CM | POA: Diagnosis present

## 2022-07-28 DIAGNOSIS — M25559 Pain in unspecified hip: Secondary | ICD-10-CM | POA: Diagnosis not present

## 2022-07-28 DIAGNOSIS — F32A Depression, unspecified: Secondary | ICD-10-CM | POA: Diagnosis present

## 2022-07-28 DIAGNOSIS — R000 Tachycardia, unspecified: Secondary | ICD-10-CM | POA: Diagnosis present

## 2022-07-28 DIAGNOSIS — I442 Atrioventricular block, complete: Secondary | ICD-10-CM | POA: Diagnosis present

## 2022-07-28 DIAGNOSIS — W19XXXA Unspecified fall, initial encounter: Secondary | ICD-10-CM | POA: Diagnosis present

## 2022-07-28 DIAGNOSIS — N1832 Chronic kidney disease, stage 3b: Secondary | ICD-10-CM

## 2022-07-28 DIAGNOSIS — E1122 Type 2 diabetes mellitus with diabetic chronic kidney disease: Secondary | ICD-10-CM | POA: Diagnosis present

## 2022-07-28 DIAGNOSIS — Z6832 Body mass index (BMI) 32.0-32.9, adult: Secondary | ICD-10-CM

## 2022-07-28 DIAGNOSIS — E669 Obesity, unspecified: Secondary | ICD-10-CM | POA: Diagnosis present

## 2022-07-28 DIAGNOSIS — D472 Monoclonal gammopathy: Secondary | ICD-10-CM | POA: Diagnosis present

## 2022-07-28 DIAGNOSIS — Z950 Presence of cardiac pacemaker: Secondary | ICD-10-CM

## 2022-07-28 DIAGNOSIS — M109 Gout, unspecified: Secondary | ICD-10-CM | POA: Diagnosis present

## 2022-07-28 DIAGNOSIS — N179 Acute kidney failure, unspecified: Secondary | ICD-10-CM | POA: Diagnosis present

## 2022-07-28 DIAGNOSIS — Z8249 Family history of ischemic heart disease and other diseases of the circulatory system: Secondary | ICD-10-CM

## 2022-07-28 DIAGNOSIS — E876 Hypokalemia: Secondary | ICD-10-CM | POA: Diagnosis present

## 2022-07-28 DIAGNOSIS — J154 Pneumonia due to other streptococci: Secondary | ICD-10-CM | POA: Diagnosis present

## 2022-07-28 DIAGNOSIS — Z8673 Personal history of transient ischemic attack (TIA), and cerebral infarction without residual deficits: Secondary | ICD-10-CM

## 2022-07-28 DIAGNOSIS — R1030 Lower abdominal pain, unspecified: Secondary | ICD-10-CM | POA: Diagnosis present

## 2022-07-28 DIAGNOSIS — Z7982 Long term (current) use of aspirin: Secondary | ICD-10-CM

## 2022-07-28 DIAGNOSIS — D252 Subserosal leiomyoma of uterus: Principal | ICD-10-CM | POA: Diagnosis present

## 2022-07-28 DIAGNOSIS — Z8041 Family history of malignant neoplasm of ovary: Secondary | ICD-10-CM

## 2022-07-28 DIAGNOSIS — M79651 Pain in right thigh: Secondary | ICD-10-CM | POA: Diagnosis present

## 2022-07-28 DIAGNOSIS — Z66 Do not resuscitate: Secondary | ICD-10-CM | POA: Diagnosis present

## 2022-07-28 DIAGNOSIS — Z7984 Long term (current) use of oral hypoglycemic drugs: Secondary | ICD-10-CM | POA: Diagnosis not present

## 2022-07-28 DIAGNOSIS — I951 Orthostatic hypotension: Secondary | ICD-10-CM | POA: Diagnosis present

## 2022-07-28 DIAGNOSIS — M79604 Pain in right leg: Secondary | ICD-10-CM | POA: Diagnosis present

## 2022-07-28 DIAGNOSIS — E7800 Pure hypercholesterolemia, unspecified: Secondary | ICD-10-CM | POA: Diagnosis present

## 2022-07-28 DIAGNOSIS — D649 Anemia, unspecified: Secondary | ICD-10-CM | POA: Diagnosis present

## 2022-07-28 DIAGNOSIS — R791 Abnormal coagulation profile: Secondary | ICD-10-CM

## 2022-07-28 DIAGNOSIS — Z79899 Other long term (current) drug therapy: Secondary | ICD-10-CM | POA: Diagnosis not present

## 2022-07-28 DIAGNOSIS — Z7901 Long term (current) use of anticoagulants: Secondary | ICD-10-CM | POA: Diagnosis not present

## 2022-07-28 DIAGNOSIS — N1330 Unspecified hydronephrosis: Secondary | ICD-10-CM | POA: Diagnosis present

## 2022-07-28 DIAGNOSIS — R4589 Other symptoms and signs involving emotional state: Secondary | ICD-10-CM

## 2022-07-28 DIAGNOSIS — Z1152 Encounter for screening for COVID-19: Secondary | ICD-10-CM

## 2022-07-28 DIAGNOSIS — D689 Coagulation defect, unspecified: Secondary | ICD-10-CM | POA: Diagnosis present

## 2022-07-28 LAB — BASIC METABOLIC PANEL
Anion gap: 10 (ref 5–15)
BUN: 34 mg/dL — ABNORMAL HIGH (ref 8–23)
CO2: 25 mmol/L (ref 22–32)
Calcium: 8.8 mg/dL — ABNORMAL LOW (ref 8.9–10.3)
Chloride: 107 mmol/L (ref 98–111)
Creatinine, Ser: 2.4 mg/dL — ABNORMAL HIGH (ref 0.44–1.00)
GFR, Estimated: 21 mL/min — ABNORMAL LOW (ref >60)
Glucose, Bld: 105 mg/dL — ABNORMAL HIGH (ref 70–99)
Potassium: 3.3 mmol/L — ABNORMAL LOW (ref 3.5–5.1)
Sodium: 142 mmol/L (ref 135–145)

## 2022-07-28 LAB — TYPE AND SCREEN
Unit division: 0
Unit division: 0

## 2022-07-28 LAB — FERRITIN: Ferritin: 338 ng/mL — ABNORMAL HIGH (ref 11–307)

## 2022-07-28 LAB — URINALYSIS, ROUTINE W REFLEX MICROSCOPIC
Bilirubin Urine: NEGATIVE
Glucose, UA: NEGATIVE mg/dL
Ketones, ur: NEGATIVE mg/dL
Nitrite: NEGATIVE
Protein, ur: 100 mg/dL — AB
Specific Gravity, Urine: 1.016 (ref 1.005–1.030)
pH: 5 (ref 5.0–8.0)

## 2022-07-28 LAB — CBC WITH DIFFERENTIAL/PLATELET
Abs Immature Granulocytes: 0.02 10*3/uL (ref 0.00–0.07)
Basophils Absolute: 0 10*3/uL (ref 0.0–0.1)
Basophils Relative: 1 %
Eosinophils Absolute: 0 10*3/uL (ref 0.0–0.5)
Eosinophils Relative: 1 %
HCT: 17.6 % — ABNORMAL LOW (ref 36.0–46.0)
Hemoglobin: 5.5 g/dL — CL (ref 12.0–15.0)
Immature Granulocytes: 0 %
Lymphocytes Relative: 11 %
Lymphs Abs: 0.7 10*3/uL (ref 0.7–4.0)
MCH: 28.9 pg (ref 26.0–34.0)
MCHC: 31.3 g/dL (ref 30.0–36.0)
MCV: 92.6 fL (ref 80.0–100.0)
Monocytes Absolute: 0.8 10*3/uL (ref 0.1–1.0)
Monocytes Relative: 13 %
Neutro Abs: 5 10*3/uL (ref 1.7–7.7)
Neutrophils Relative %: 74 %
Platelets: 253 10*3/uL (ref 150–400)
RBC: 1.9 MIL/uL — ABNORMAL LOW (ref 3.87–5.11)
RDW: 15.7 % — ABNORMAL HIGH (ref 11.5–15.5)
WBC: 6.6 10*3/uL (ref 4.0–10.5)
nRBC: 0 % (ref 0.0–0.2)

## 2022-07-28 LAB — SARS CORONAVIRUS 2 BY RT PCR: SARS Coronavirus 2 by RT PCR: NEGATIVE

## 2022-07-28 LAB — IRON AND TIBC
Iron: 23 ug/dL — ABNORMAL LOW (ref 28–170)
Saturation Ratios: 15 % (ref 10.4–31.8)
TIBC: 154 ug/dL — ABNORMAL LOW (ref 250–450)
UIBC: 131 ug/dL

## 2022-07-28 LAB — BPAM RBC: ISSUE DATE / TIME: 202404110631

## 2022-07-28 LAB — CBG MONITORING, ED: Glucose-Capillary: 99 mg/dL (ref 70–99)

## 2022-07-28 LAB — PROTIME-INR
INR: 5.8 (ref 0.8–1.2)
Prothrombin Time: 51.5 seconds — ABNORMAL HIGH (ref 11.4–15.2)

## 2022-07-28 LAB — PREPARE RBC (CROSSMATCH)

## 2022-07-28 LAB — ABO/RH: ABO/RH(D): O POS

## 2022-07-28 MED ORDER — CEFTRIAXONE SODIUM 1 G IJ SOLR: 1.0000 g | INTRAMUSCULAR | Status: DC

## 2022-07-28 MED ORDER — ACETAMINOPHEN 650 MG RE SUPP: 650.0000 mg | Freq: Four times a day (QID) | RECTAL | Status: DC | PRN

## 2022-07-28 MED ORDER — PAROXETINE HCL 10 MG PO TABS
10.0000 mg | ORAL_TABLET | Freq: Every day | ORAL | Status: DC
  Administered 2022-07-28 – 2022-07-31 (×4): 10 mg via ORAL
  Filled 2022-07-28 (×4): qty 1

## 2022-07-28 MED ORDER — ROSUVASTATIN CALCIUM 20 MG PO TABS
20.0000 mg | ORAL_TABLET | Freq: Every day | ORAL | Status: DC
  Administered 2022-07-28 – 2022-07-30 (×3): 20 mg via ORAL
  Filled 2022-07-28 (×3): qty 1

## 2022-07-28 MED ORDER — VITAMIN K1 10 MG/ML IJ SOLN
10.0000 mg | Freq: Once | INTRAVENOUS | Status: AC
  Administered 2022-07-28: 10 mg via INTRAVENOUS
  Filled 2022-07-28: qty 1

## 2022-07-28 MED ORDER — SODIUM CHLORIDE 0.9% IV SOLUTION: Freq: Once | INTRAVENOUS | Status: DC

## 2022-07-28 MED ORDER — ACETAMINOPHEN 325 MG PO TABS
650.0000 mg | ORAL_TABLET | Freq: Once | ORAL | Status: AC
  Administered 2022-07-28: 650 mg via ORAL
  Filled 2022-07-28: qty 2

## 2022-07-28 MED ORDER — VITAMIN B-12 1000 MCG PO TABS
1000.0000 ug | ORAL_TABLET | Freq: Every day | ORAL | Status: DC
  Administered 2022-07-28 – 2022-07-31 (×4): 1000 ug via ORAL
  Filled 2022-07-28 (×4): qty 1

## 2022-07-28 MED ORDER — ACETAMINOPHEN 325 MG PO TABS
650.0000 mg | ORAL_TABLET | Freq: Four times a day (QID) | ORAL | Status: DC | PRN
  Administered 2022-07-29: 650 mg via ORAL
  Filled 2022-07-28: qty 2

## 2022-07-28 MED ORDER — AZITHROMYCIN 500 MG PO TABS
500.0000 mg | ORAL_TABLET | Freq: Every day | ORAL | Status: AC
  Administered 2022-07-28 – 2022-07-30 (×3): 500 mg via ORAL
  Filled 2022-07-28: qty 1
  Filled 2022-07-28: qty 2
  Filled 2022-07-28: qty 1

## 2022-07-28 MED ORDER — SODIUM CHLORIDE 0.9 % IV SOLN
2.0000 g | INTRAVENOUS | Status: DC
  Administered 2022-07-28: 2 g via INTRAVENOUS
  Filled 2022-07-28: qty 20

## 2022-07-28 NOTE — H&P (Addendum)
Date: 07/28/2022               Patient Name:  Renee Payne MRN: [id number]  DOB: September 19, 1950 Age / Sex: 72 y.o., female   PCP: [facility]         Medical Service: Internal Medicine Teaching Service         Attending Physician: Dr. Dickie La, MD    First Contact: Dr. Morene Crocker Pager: [phone]  Second Contact: Dr. Elza Rafter Pager: [phone]       After Hours (After 5p/  First Contact Pager: [phone]  weekends / holidays): Second Contact Pager: [phone]   Chief Complaint: dizziness  History of Present Illness:  Renee Payne is a 72 year old person living with T2DM, HTN, CKD IIIb, h/o PE on chronic anticoagulation on warfarin, CVA,  MGUS,  recent PPM implantation on 06/20/2022 for complete heart block who presents after falling this morning around 11pm. Reports she went to the bathroom, leaving walking outside as it does not fit through the door. After getting up from the commode she felt very dizzy and her right leg buckled causing her to fall and hitting her head on the hamper. Was unable to stand due to weakness and crawler out of the room the call EMS. Reports she has has worsening dizziness for the last 2 weeks with associated fatigue.This is elicited by standing or activity. Improved after a few minutes of rest.  Also endorses nonproductive cough with associated fever, chills, and shortness of breath. Has not check her temperature at home. Recently has had a pacemaker placed for complete heart block. Notes history of uterine fibroids and was referred to GYN but has not yet had an appointment. Has associated RLQ pain with this. Feels this is worse lately. Denies any vaginal bleeding or discharge. She has been taking colchicine for the past week for a gout flare in the right foot that has resolved.. She denies  chest pain, palpitations, n/v/d, hematuria, melena, hematochezia, LOC, numbness, tingling, or weakness.  Meds:  Current Meds  Medication Sig    acetaminophen (TYLENOL) 325 MG tablet Take 2 tablets (650 mg total) by mouth every 4 (four) hours as needed for headache or mild pain.   aspirin EC 81 MG EC tablet Take 1 tablet (81 mg total) by mouth daily.   Colchicine 0.6 MG CAPS Take 0.6-1.2 mg by mouth See admin instructions. Take 1.2 mg once in the morning then 0.6 mg that night on the first day. Then take 0.6 mg once daily as needed for a gout flare up.   losartan (COZAAR) 50 MG tablet Take 50 mg by mouth daily.   metFORMIN (GLUCOPHAGE) 1000 MG tablet Take 1,000 mg by mouth daily.   rosuvastatin (CRESTOR) 20 MG tablet Take 20 mg by mouth at bedtime.   valsartan (DIOVAN) 160 MG tablet Take 160 mg by mouth daily.   vitamin B-12 (CYANOCOBALAMIN) 1000 MCG tablet Take 1 tablet (1,000 mcg total) by mouth daily.   warfarin (COUMADIN) 5 MG tablet Take 0.5-1 tablets (2.5-5 mg total) by mouth See admin instructions. Take 2.5mg  every evening except for 5mg  on Monday and Fridays   White Petrolatum-Mineral Oil (EYE LUBRICANT OP) Place 2 drops into both eyes as needed (dry eye).   Allergies: Allergies as of 07/28/2022 - Review Complete 07/28/2022  Allergen Reaction Noted   Ativan [lorazepam] Other (See Comments) 11/13/2016   Omeprazole Other (See Comments) 09/08/2020   Past Medical History:  Diagnosis Date  Diabetes mellitus without complication    Hyperlipidemia    Hypertension    Pulmonary emboli     Family History: Ovarian cancer(mother, DM (Father, PGM), HTN(sister, brother, PGM)   Social History: Lives with sister, son and daughter live nearby. Walks with a walker. Sister assists with IADLs. She is able to perform ADLs. Denies history of alcohol, tobacco, or illicit drug use.   PCP Maryjo Rochester PA-c at [facility] in [place]   Review of Systems: A complete ROS was negative except as per HPI.   Physical Exam: Blood pressure 136/87, pulse 78, temperature 99 F (37.2 C), temperature source Oral, resp. rate 18, height 5\' 1"   (1.549 m), weight 79.8 kg, SpO2 100 %. Constitutional: Chronically ill appearing, laying in bed, tearful HENT: EOMI, PERRLA, moist mucous membranes Cardiovascular: Normal rate, regular rhythm, S1 and S2 present, no murmurs, rubs, gallops.  Distal pulses intact, no JVD Respiratory: No respiratory distress, no accessory muscle use.  Effort is normal. Crackles heard at the left lung bases, clear on right HK:[id number] RLQ abdominal pain with deep palpation, no mass, or organomegaly, normal BS Musculoskeletal: Normal bulk and tone.  No peripheral edema noted., no joint swelling or tenderness Neurological: Is alert and oriented x4, no focal deficits  Skin: Pale, cap refill <2 seconds Psychiatric: Tearful, passive SI, no plan, no HI, no AVD  EKG: personally reviewed my interpretation is atrially paced ventricular paced   CXR 1. Ill-defined opacities within the left lung base, which may reflect atelectasis or pneumonia. 2. Borderline cardiomegaly.   CT abdomen pelvis 1. Interval enlargement large, dominant subserosal fibroid within the uterine fundus. Heterogeneous increased in density within this mass is blood products findings are favored to represent hemorrhagic degeneration of the uterine fibroid. CTA of the abdomen and pelvis may be helpful to assess for active bleeding. 2. Mild right hydronephrosis and proximal hydroureter. This is favored to reflect mass effect upon the right ureter from enlarged fibroid. 3. Gallstone without evidence for acute cholecystitis. 4. Aortic Atherosclerosis (ICD10-I70.0).   Assessment & Plan by Problem: Principal Problem:   Symptomatic anemia  Symptomatic Anemia Subserosal uterine fibroid Presenting with 2 weeks of worsening dizziness. HGB noted to be 5.5 down from 8.8 on 06/20/2022. HDS and remains normotensive. She reports no signs of overt bleeding. CT with enlargement of subserosal fibroid. INR elevated to 5.8. Suspect slow bleeding into her fibroid  based on her history. No other obvious source of bleeding at this time. - Transfuse 2 units pRBCs - Post transfusion HH, repeat hgb at 1700 - Vitamin K 10 mg IV given to reverse warfarin, will hold warfarin and monitor INR in morning - orthostatic vitals  - Discussed with GYN about fibroid, appreciate recommendations  CAP Patient reports cough and fevers for the past week. CXR with LLL opacity. Febrile to 100.8 today. On 2 L [place]. -tylenol PRN fever and pain - Start azithromycin and ceftriaxone - Strep pneumonia urine antigen - O2 as needed to maintain saturations >  AKI on CKDIIB BUN elevated to 34 Cr 2.4. baseline Cr 1.7 although it has been around 2 for the past month. Suspected relate to her anemia. Suspect she is having orthostatic hypotension with her dizziness.. CT abdomen noted mild right hydroureter and possible mass effect from uterine fibroid. Discussed with GYN suspect this is only causing intermittent obstruction.  - monitor BMP, I/os, electrolytes - could consider stenting if obstruction remains a concern or kidney function not improving  History of PE on chronic warfarin  On chronic anticoagulation. Has been taking her warfarin as normal. Denies changes in dosing. Has been trying to eat less red meat due to her gout but otherwise no diet changes.  -Hold warfarin -received IV vitamin K -pharmacy consulted -Monitor INR daily -No specific indication for warfarin, may benefit from switching to DOAC if this is affordable for her  HTN  Home medications include amlodipine 5 mg daily losartan 50 mg daily and valsartan 160 mg daily. Unclear if she is taking 2 ARBs. Patient cannot recall. I called her daughter who sees both on the med list but is unsure if patient has been filling both. BP normotensive here. This may be contributing to her episodes of  -hold BP medications  -Check orthostatics  Gout Reports had a flare in the left mid foot and has been on colchicine for the last  week. Now resolved. Previously on allopurinol but stopped a few months a go as she felt this was precipitating more flares and has had 4 flares since stopping this. Uric acid 7.6 in January. Likely need urate lowering medications to prevent flares although kidney function may limit use of allopurinol and she is adamantly against it. Will continue to discuss this with her once hgb stabilized.  History of CVA HLD No residual deficits. On Asa 81 mg and rosuvastatin. -hold asa -continue statin  Complete heart block  Underwent PPM implantation on 06/20/2022. Healed well from this.   T2DM On metformin 1000 mg twice daily last A1c 6.3% on 06/18/2022 -CBG monitoring  MGUS Follows with hematology oncology Dr. Welton FlakesKhan. M spike of IgG lamda. Serum light chain ratio 2.48 . -Needs outaptient follow up  Depression On Paxil 10 mg daily. Reports passive SI due to multiple health issues, poor family supports. During discussion of Code status patient notes she feels she would rather pass than pursue invasive medical treatment. Possible this may be influencing her medical decisions. Will need to discuss further if she does need any procedure.  -Continue Paxil 10 mg -Will need outpatient follow up  Dispo: Admit patient to Inpatient with expected length of stay greater than 2 midnights.  Signed: Quincy SimmondsLiang, Altamese Deguire, MD 07/28/2022, 11:57 AM  Pager: [phone] After 5pm on weekdays and 1pm on weekends: On Call pager: [phone]

## 2022-07-28 NOTE — ED Notes (Signed)
Pt being transferred to inpatient unit at this time via stretcher with all belongings. 

## 2022-07-28 NOTE — ED Provider Notes (Addendum)
[place] EMERGENCY DEPARTMENT AT [facility] Provider Note   CSN: [id number] Arrival date & time: 07/28/22  0210     History  Chief Complaint  Patient presents with   Dizziness    Renee Payne is a 72 y.o. female.  Past medical history of diabetes, high cholesterol, CVA, PE warfarin, heart block status post pacemaker last month at [facility].  Presents ER today complaining of a fall.  Patient states she was having some stomach discomfort and went to the bathroom, the toilet and stood up.  She states she does have chronic dizziness when standing up, she is when she stood up she was having some pain in her right leg which "gave out" on her.  Has had no injury, no loss of consciousness  Dizziness      Home Medications Prior to Admission medications   Medication Sig Start Date End Date Taking? Authorizing Provider  acetaminophen (TYLENOL) 325 MG tablet Take 2 tablets (650 mg total) by mouth every 4 (four) hours as needed for headache or mild pain. 06/21/22  Yes Sherie Don, NP  aspirin EC 81 MG EC tablet Take 1 tablet (81 mg total) by mouth daily. 11/16/16  Yes Marlin Canary U, DO  Colchicine 0.6 MG CAPS Take 0.6-1.2 mg by mouth See admin instructions. Take 1.2 mg once in the morning then 0.6 mg that night on the first day. Then take 0.6 mg once daily as needed for a gout flare up. 04/14/22  Yes [provider]  losartan (COZAAR) 50 MG tablet Take 50 mg by mouth daily. 05/19/22  Yes [provider]  metFORMIN (GLUCOPHAGE) 1000 MG tablet Take 1,000 mg by mouth daily.   Yes [provider]  rosuvastatin (CRESTOR) 20 MG tablet Take 20 mg by mouth at bedtime. 06/17/22  Yes [provider]  vitamin B-12 (CYANOCOBALAMIN) 1000 MCG tablet Take 1 tablet (1,000 mcg total) by mouth daily. 11/16/16  Yes Vann, Selinda Orion, DO  White Petrolatum-Mineral Oil (EYE LUBRICANT OP) Place 2 drops into both eyes as needed (dry eye).   Yes [provider]   apixaban (ELIQUIS) 5 MG TABS tablet Take 1 tablet (5 mg total) by mouth 2 (two) times daily. 07/29/22 08/28/22  Morene Crocker, MD  PARoxetine (PAXIL) 10 MG tablet Take 1 tablet (10 mg total) by mouth daily. 07/30/22 08/29/22  Morene Crocker, MD      Allergies    Ativan [lorazepam] and Omeprazole    Review of Systems   Review of Systems  Neurological:  Positive for dizziness.    Physical Exam Updated Vital Signs BP 134/79 (BP Location: Left Arm)   Pulse 81   Temp 98.6 F (37 C) (Oral)   Resp 17   Ht 5\' 1"  (1.549 m)   Wt 78.4 kg   SpO2 97%   BMI 32.66 kg/m  Physical Exam Vitals and nursing note reviewed.  Constitutional:      General: She is not in acute distress.    Appearance: She is well-developed.  HENT:     Head: Normocephalic and atraumatic.     Mouth/Throat:     Mouth: Mucous membranes are dry.  Eyes:     Conjunctiva/sclera: Conjunctivae normal.  Cardiovascular:     Rate and Rhythm: Normal rate and regular rhythm.     Heart sounds: No murmur heard. Pulmonary:     Effort: Pulmonary effort is normal. No respiratory distress.     Breath sounds: Normal breath sounds.  Abdominal:  Palpations: Abdomen is soft.     Tenderness: There is abdominal tenderness in the right lower quadrant, suprapubic area and left lower quadrant. There is no guarding or rebound.  Musculoskeletal:        General: No swelling.     Cervical back: Neck supple.  Skin:    General: Skin is warm and dry.     Capillary Refill: Capillary refill takes less than 2 seconds.  Neurological:     General: No focal deficit present.     Mental Status: She is alert and oriented to person, place, and time.  Psychiatric:        Mood and Affect: Mood normal.     ED Results / Procedures / Treatments   Labs (all labs ordered are listed, but only abnormal results are displayed) Labs Reviewed  BASIC METABOLIC PANEL - Abnormal; Notable for the following components:      Result Value    Potassium 3.3 (*)    Glucose, Bld 105 (*)    BUN 34 (*)    Creatinine, Ser 2.40 (*)    Calcium 8.8 (*)    GFR, Estimated 21 (*)    All other components within normal limits  CBC WITH DIFFERENTIAL/PLATELET - Abnormal; Notable for the following components:   RBC 1.90 (*)    Hemoglobin 5.5 (*)    HCT 17.6 (*)    RDW 15.7 (*)    All other components within normal limits  PROTIME-INR - Abnormal; Notable for the following components:   Prothrombin Time 51.5 (*)    INR 5.8 (*)    All other components within normal limits  URINALYSIS, ROUTINE W REFLEX MICROSCOPIC - Abnormal; Notable for the following components:   APPearance CLOUDY (*)    Hgb urine dipstick MODERATE (*)    Protein, ur 100 (*)    Leukocytes,Ua TRACE (*)    Bacteria, UA FEW (*)    All other components within normal limits  IRON AND TIBC - Abnormal; Notable for the following components:   Iron 23 (*)    TIBC 154 (*)    All other components within normal limits  FERRITIN - Abnormal; Notable for the following components:   Ferritin 338 (*)    All other components within normal limits  CBC - Abnormal; Notable for the following components:   RBC 2.72 (*)    Hemoglobin 7.7 (*)    HCT 23.8 (*)    RDW 15.9 (*)    All other components within normal limits  BASIC METABOLIC PANEL - Abnormal; Notable for the following components:   BUN 28 (*)    Creatinine, Ser 2.04 (*)    GFR, Estimated 25 (*)    All other components within normal limits  PROTIME-INR - Abnormal; Notable for the following components:   Prothrombin Time 21.2 (*)    INR 1.9 (*)    All other components within normal limits  CBC - Abnormal; Notable for the following components:   RBC 2.66 (*)    Hemoglobin 7.7 (*)    HCT 23.1 (*)    RDW 16.2 (*)    All other components within normal limits  CBC - Abnormal; Notable for the following components:   RBC 2.83 (*)    Hemoglobin 8.1 (*)    HCT 25.3 (*)    RDW 15.7 (*)    All other components within normal  limits  BASIC METABOLIC PANEL - Abnormal; Notable for the following components:   Glucose, Bld 127 (*)  BUN 28 (*)    Creatinine, Ser 2.12 (*)    GFR, Estimated 24 (*)    All other components within normal limits  CBC - Abnormal; Notable for the following components:   RBC 2.57 (*)    Hemoglobin 7.5 (*)    HCT 22.5 (*)    All other components within normal limits  BASIC METABOLIC PANEL - Abnormal; Notable for the following components:   Sodium 134 (*)    BUN 24 (*)    Creatinine, Ser 1.86 (*)    Calcium 8.7 (*)    GFR, Estimated 28 (*)    All other components within normal limits  SARS CORONAVIRUS 2 BY RT PCR  STREP PNEUMONIAE URINARY ANTIGEN  MAGNESIUM  GLUCOSE, CAPILLARY  CBG MONITORING, ED  TYPE AND SCREEN  PREPARE RBC (CROSSMATCH)  ABO/RH    EKG EKG Interpretation  Date/Time:  Thursday July 28 2022 03:47:09 EDT Ventricular Rate:  104 PR Interval:  166 QRS Duration: 160 QT Interval:  400 QTC Calculation: 526 R Axis:   -45 Text Interpretation: Atrial-sensed ventricular-paced rhythm Abnormal ECG No significant change since last tracing Confirmed by Zadie RhineWickline, Donald ([id number]) on 07/28/2022 3:49:35 AM  Radiology No results found.  Procedures .Critical Care  Performed by: Ma RingsBeatty, Alexianna Nachreiner A, PA-C Authorized by: Ma RingsBeatty, Burch Marchuk A, PA-C   Critical care provider statement:    Critical care time (minutes):  30   Critical care was necessary to treat or prevent imminent or life-threatening deterioration of the following conditions:  Circulatory failure   Critical care was time spent personally by me on the following activities:  Development of treatment plan with patient or surrogate, discussions with consultants, evaluation of patient's response to treatment, examination of patient, ordering and review of laboratory studies, ordering and review of radiographic studies, ordering and performing treatments and interventions, pulse oximetry, re-evaluation of patient's  condition and review of old charts     Medications Ordered in ED Medications  acetaminophen (TYLENOL) tablet 650 mg (650 mg Oral Given 07/28/22 0806)  phytonadione (VITAMIN K) 10 mg in dextrose 5 % 50 mL IVPB (0 mg Intravenous Stopped 07/28/22 1200)  azithromycin (ZITHROMAX) tablet 500 mg (500 mg Oral Given 07/30/22 0932)  potassium chloride SA (KLOR-CON M) CR tablet 40 mEq (40 mEq Oral Given 07/29/22 1012)  lactated ringers bolus 1,000 mL ( Intravenous Infusion Verify 07/30/22 1514)    ED Course/ Medical Decision Making/ A&P                             Medical Decision Making This patient presents to the ED for concern of dizziness with a fall, this involves an extensive number of treatment options, and is a complaint that carries with it a high risk of complications and morbidity.  The differential diagnosis includes anemia, vertigo, syncope, CVA, intracranial hemorrhage, other  Patientstates she is chronically dizzy, and was getting up from the toilet and stood up quickly, states she had sharp pain in her right leg and fell down.  States she has had this pain before and has problems with her back.  Denies any numbness tingling or weakness, denies head injury.  She is on Coumadin and has pacemaker for complete heart block.  Denies syncope, states the dizziness is usual for her.  She admits to abdominal pain, no nausea vomiting or diarrhea, no black or bloody stools, no hematemesis, no rectal or vaginal bleeding.  No other complaints.  Patient has moderate  tenderness on exam so CT was ordered and shows likely degenerating fibroid. CT head ordered given patient's age and fall on blood thinners though she did denies any head strike.  Denies loss of consciousness, CT head shows no acute findings  Rectal exam shows brown stool.  No blood  INR is 5.8 today.  Given no active bleeding there is no need to give vitamin K Her hemoglobin is 5.5.  Discussed risk and benefits of transfusion with the  patient and he is agreeable with going ahead with transfusion.  She has never had this in the past.  Last hemoglobin was around 8.   Co morbidities that complicate the patient evaluation  Complete heart block with pacemaker, history of PE on Coumadin   Additional history obtained:  Additional history obtained from EMR.  External records from outside source obtained and reviewed including cardiology notes from pacemaker placement at [facility]   Waiting on hospitalist callback, for admission for MAC anemia and coagulopathy, no active bleeding, no indication for vitamin K, right thigh is supple with no obvious hematoma to suggest this is the cause of her bleeding.    Amount and/or Complexity of Data Reviewed Labs: ordered. Radiology: ordered. ECG/medicine tests: ordered.  Risk OTC drugs. Prescription drug management. Decision regarding hospitalization.          Final Clinical Impression(s) / ED Diagnoses Final diagnoses:  Anemia, unspecified type  Coagulopathy    Rx / DC Orders ED Discharge Orders          Ordered    Increase activity slowly        07/31/22 1457    Diet - low sodium heart healthy        07/31/22 1457    Call MD for:  extreme fatigue        07/31/22 1457    Call MD for:  persistant dizziness or light-headedness        07/31/22 1457    Call MD for:  hives        07/31/22 1457    Call MD for:  difficulty breathing, headache or visual disturbances        07/31/22 1457    Call MD for:  redness, tenderness, or signs of infection (pain, swelling, redness, odor or green/yellow discharge around incision site)        07/31/22 1457    Call MD for:  severe uncontrolled pain        07/31/22 1457    Call MD for:  persistant nausea and vomiting        07/31/22 1457    Call MD for:  temperature >100.4        07/31/22 1457    amoxicillin-clavulanate (AUGMENTIN) 500-125 MG tablet  2 times daily        07/29/22 1334    PARoxetine (PAXIL) 10 MG tablet   Daily        07/29/22 1334    apixaban (ELIQUIS) 5 MG TABS tablet  2 times daily        07/29/22 1334              Ma Rings, PA-C 07/28/22 0751    Ma Rings, PA-C 08/04/22 1231    Zadie Rhine, MD 08/04/22 2307

## 2022-07-28 NOTE — ED Notes (Signed)
ED TO INPATIENT HANDOFF REPORT  ED Nurse Name and Phone #: Jeanluc Wegman [phone]  S Name/Age/Gender Renee Payne 72 y.o. female Room/Bed: 039C/039C  Code Status   Code Status: DNR  Home/SNF/Other Home Patient oriented to: self, place, time, and situation Is this baseline? Yes   Triage Complete: Triage complete  Chief Complaint Symptomatic anemia [D64.9]  Triage Note Pt complaining of feeling dizzy today, did fall from standing complaining of lowe abdominal pain and rt ankle pain.  She did have a pacemaker placed 6 weeks ago and it is bouncing all over the place from 76 to 110   Allergies Allergies  Allergen Reactions   Ativan [Lorazepam] Other (See Comments)    COMBATIVE AND EXTREMELY VIOLENT   Omeprazole Other (See Comments)    The front of her body felt very warm after one dose of this med.    Level of Care/Admitting Diagnosis ED Disposition     ED Disposition  Admit   Condition  --   Comment  Hospital Area: [facility] [[id number]]  Level of Care: Med-Surg [16]  May admit patient to [facility] or [facility] if equivalent level of care is available:: No  Covid Evaluation: Confirmed COVID Negative  Diagnosis: Symptomatic anemia [9811914]  Admitting Physician: Dickie La [[id number]]  Attending Physician: Dickie La [[id number]]  Certification:: I certify this patient will need inpatient services for at least 2 midnights  Estimated Length of Stay: 2          B Medical/Surgery History Past Medical History:  Diagnosis Date   Diabetes mellitus without complication    Hyperlipidemia    Hypertension    Pulmonary emboli    Past Surgical History:  Procedure Laterality Date   IVC FILTER INSERTION     PACEMAKER IMPLANT N/A 06/20/2022   Procedure: PACEMAKER IMPLANT - DUAL CHAMBER;  Surgeon: Lanier Prude, MD;  Location: [facility];  Service: Cardiovascular;  Laterality: N/A;   TUBAL LIGATION       A IV Location/Drains/Wounds Patient  Lines/Drains/Airways Status     Active Line/Drains/Airways     Name Placement date Placement time Site Days   Peripheral IV 07/28/22 18 G 1" Anterior;Left;Proximal Forearm 07/28/22  0200  Forearm  less than 1   Peripheral IV 07/28/22 22 G Left Hand 07/28/22  1220  Hand  less than 1            Intake/Output Last 24 hours  Intake/Output Summary (Last 24 hours) at 07/28/2022 1735 Last data filed at 07/28/2022 1605 Gross per 24 hour  Intake 789.42 ml  Output 400 ml  Net 389.42 ml    Labs/Imaging Results for orders placed or performed during the hospital encounter of 07/28/22 (from the past 48 hour(s))  Basic metabolic panel     Status: Abnormal   Collection Time: 07/28/22  3:30 AM  Result Value Ref Range   Sodium 142 135 - 145 mmol/L   Potassium 3.3 (L) 3.5 - 5.1 mmol/L   Chloride 107 98 - 111 mmol/L   CO2 25 22 - 32 mmol/L   Glucose, Bld 105 (H) 70 - 99 mg/dL    Comment: Glucose reference range applies only to samples taken after fasting for at least 8 hours.   BUN 34 (H) 8 - 23 mg/dL   Creatinine, Ser 8.46 (H) 0.44 - 1.00 mg/dL   Calcium 8.8 (L) 8.9 - 10.3 mg/dL   GFR, Estimated 21 (L) >60 mL/min    Comment: (NOTE) Calculated  using the CKD-EPI Creatinine Equation (2021)    Anion gap 10 5 - 15    Comment: Performed at [facility], [address] [place], [place] [address]  CBC WITH DIFFERENTIAL     Status: Abnormal   Collection Time: 07/28/22  3:30 AM  Result Value Ref Range   WBC 6.6 4.0 - 10.5 K/uL   RBC 1.90 (L) 3.87 - 5.11 MIL/uL   Hemoglobin 5.5 (LL) 12.0 - 15.0 g/dL    Comment: REPEATED TO VERIFY THIS CRITICAL RESULT HAS VERIFIED AND BEEN CALLED TO KIRK BRANCH RN BY SHERRY GALLOWAY ON 04 11 2024 AT 0402, AND HAS BEEN READ BACK.  THIS CRITICAL RESULT HAS VERIFIED AND BEEN CALLED TO KIRK BRANCH RN BY SHERRY GALLOWAY ON 04 11 2024 AT 0403, AND HAS BEEN READ BACK.     HCT 17.6 (L) 36.0 - 46.0 %   MCV 92.6 80.0 - 100.0 fL   MCH 28.9 26.0 - 34.0 pg   MCHC  31.3 30.0 - 36.0 g/dL   RDW 60.4 (H) 54.0 - 98.1 %   Platelets 253 150 - 400 K/uL   nRBC 0.0 0.0 - 0.2 %   Neutrophils Relative % 74 %   Neutro Abs 5.0 1.7 - 7.7 K/uL   Lymphocytes Relative 11 %   Lymphs Abs 0.7 0.7 - 4.0 K/uL   Monocytes Relative 13 %   Monocytes Absolute 0.8 0.1 - 1.0 K/uL   Eosinophils Relative 1 %   Eosinophils Absolute 0.0 0.0 - 0.5 K/uL   Basophils Relative 1 %   Basophils Absolute 0.0 0.0 - 0.1 K/uL   Immature Granulocytes 0 %   Abs Immature Granulocytes 0.02 0.00 - 0.07 K/uL    Comment: Performed at [facility], [address] [place], [place] [address]  ABO/Rh     Status: None   Collection Time: 07/28/22  3:30 AM  Result Value Ref Range   ABO/RH(D)      O POS Performed at [facility], [address] [place], [place] [address]   CBG monitoring, ED     Status: None   Collection Time: 07/28/22  3:33 AM  Result Value Ref Range   Glucose-Capillary 99 70 - 99 mg/dL    Comment: Glucose reference range applies only to samples taken after fasting for at least 8 hours.  Prepare RBC (crossmatch)     Status: None   Collection Time: 07/28/22  4:12 AM  Result Value Ref Range   Order Confirmation      ORDER PROCESSED BY BLOOD BANK Performed at [facility], [address], [place], [place] [address]   Protime-INR     Status: Abnormal   Collection Time: 07/28/22  4:20 AM  Result Value Ref Range   Prothrombin Time 51.5 (H) 11.4 - 15.2 seconds   INR 5.8 (HH) 0.8 - 1.2    Comment: REPEATED TO VERIFY CRITICAL RESULT CALLED TO, READ BACK BY AND VERIFIED WITH:  W. TYLER RN, [id number], 07/28/22, EADEDOKUN (NOTE) INR goal varies based on device and disease states. Performed at [facility], [address], [place], [place] [address]   Type and screen [facility] [place] [facility]     Status: None (Preliminary result)   Collection Time: 07/28/22  4:20 AM  Result Value Ref Range   ABO/RH(D) O POS    Antibody Screen NEG    Sample Expiration  07/31/2022,2359    Unit Number [id number]  Blood Component Type RBC, LR IRR    Unit division 00    Status of Unit ISSUED    Transfusion Status OK TO TRANSFUSE    Crossmatch Result Compatible    Unit Number [id number]    Blood Component Type RBC LR PHER1    Unit division 00    Status of Unit ISSUED    Transfusion Status OK TO TRANSFUSE    Crossmatch Result      Compatible Performed at [facility], [address] [place], [place] [address]   SARS Coronavirus 2 by RT PCR (hospital order, performed in [facility] hospital lab) *cepheid single result test* Anterior Nasal Swab     Status: None   Collection Time: 07/28/22  8:14 AM   Specimen: Anterior Nasal Swab  Result Value Ref Range   SARS Coronavirus 2 by RT PCR NEGATIVE NEGATIVE    Comment: Performed at [facility], [address] [place], [place] [address]  Urinalysis, Routine w reflex microscopic -Urine, Clean Catch     Status: Abnormal   Collection Time: 07/28/22 10:32 AM  Result Value Ref Range   Color, Urine YELLOW YELLOW   APPearance CLOUDY (A) CLEAR   Specific Gravity, Urine 1.016 1.005 - 1.030   pH 5.0 5.0 - 8.0   Glucose, UA NEGATIVE NEGATIVE mg/dL   Hgb urine dipstick MODERATE (A) NEGATIVE   Bilirubin Urine NEGATIVE NEGATIVE   Ketones, ur NEGATIVE NEGATIVE mg/dL   Protein, ur 324100 (A) NEGATIVE mg/dL   Nitrite NEGATIVE NEGATIVE   Leukocytes,Ua TRACE (A) NEGATIVE   RBC / HPF 0-5 0 - 5 RBC/hpf   WBC, UA 6-10 0 - 5 WBC/hpf   Bacteria, UA FEW (A) NONE SEEN   Squamous Epithelial / HPF 6-10 0 - 5 /HPF   Mucus PRESENT    Hyaline Casts, UA PRESENT    Amorphous Crystal PRESENT     Comment: Performed at [facility], [address] [place], [place] [address]   DG CHEST PORT 1 VIEW  Result Date: 07/28/2022 CLINICAL DATA:  Provided history: Cough. EXAM: PORTABLE CHEST 1 VIEW COMPARISON:  Prior chest radiographs 06/21/2022 and earlier. FINDINGS: Left chest dual lead implantable cardiac pacemaker.  Borderline cardiomegaly. Ill-defined opacities within the medial left lung base. No appreciable airspace consolidation on the right. No evidence of pleural effusion or pneumothorax. No acute osseous abnormality identified. Degenerative changes of the spine. IMPRESSION: 1. Ill-defined opacities within the left lung base, which may reflect atelectasis or pneumonia. 2. Borderline cardiomegaly. Electronically Signed   By: Jackey LogeKyle  Golden D.O.   On: 07/28/2022 10:30   CT ABDOMEN PELVIS WO CONTRAST  Result Date: 07/28/2022 CLINICAL DATA:  Abdominal pain.  Dizziness. EXAM: CT ABDOMEN AND PELVIS WITHOUT CONTRAST TECHNIQUE: Multidetector CT imaging of the abdomen and pelvis was performed following the standard protocol without IV contrast. RADIATION DOSE REDUCTION: This exam was performed according to the departmental dose-optimization program which includes automated exposure control, adjustment of the mA and/or kV according to patient size and/or use of iterative reconstruction technique. COMPARISON:  MRI abdomen and pelvis 02/16/2022 FINDINGS: Lower chest: No acute abnormality. Hepatobiliary: No focal liver abnormality. Stone within the gallbladder fundus measures 1.5 cm. No gallbladder wall thickening or inflammation. No significant bile duct dilatation Pancreas: Unremarkable. No pancreatic ductal dilatation or surrounding inflammatory changes. Spleen: Normal in size without focal abnormality. Adrenals/Urinary Tract: Normal adrenal glands. Bilateral kidney cysts are again noted. Largest arises off the inferior pole of the right kidney  measured 4.4 cm. No follow-up imaging recommended. Mild right hydronephrosis and proximal hydroureter. No urinary tract calculi. Bladder appears normal for the degree of distension. Stomach/Bowel: Stomach appears normal. The appendix is visualized and appears normal. No bowel wall thickening, inflammation or distension. Vascular/Lymphatic: Aortic atherosclerosis. No abdominopelvic  adenopathy. Reproductive: Again seen is a large, dominant exophytic fibroid arising off the uterine fundus which is slightly eccentric right of the midline. This measures 12.7 x 10.8 by 11.7 cm (volume = 840 cm^3). On the previous MRI this measured 8.7 by 5.9 by 7.6 cm (volume = 200 cm^3). There is mixed attenuation within this mass with areas of increased density concerning for hemorrhage into a large fibroid. Other: There is no free fluid or fluid collections identified within the abdomen or pelvis. No signs of pneumoperitoneum. Musculoskeletal: No acute or significant osseous findings. IMPRESSION: 1. Interval enlargement large, dominant subserosal fibroid within the uterine fundus. Heterogeneous increased in density within this mass is blood products findings are favored to represent hemorrhagic degeneration of the uterine fibroid. CTA of the abdomen and pelvis may be helpful to assess for active bleeding. 2. Mild right hydronephrosis and proximal hydroureter. This is favored to reflect mass effect upon the right ureter from enlarged fibroid. 3. Gallstone without evidence for acute cholecystitis. 4. Aortic Atherosclerosis (ICD10-I70.0). Electronically Signed   By: Signa Kell M.D.   On: 07/28/2022 06:36   CT Head Wo Contrast  Result Date: 07/28/2022 CLINICAL DATA:  Head trauma EXAM: CT HEAD WITHOUT CONTRAST TECHNIQUE: Contiguous axial images were obtained from the base of the skull through the vertex without intravenous contrast. RADIATION DOSE REDUCTION: This exam was performed according to the departmental dose-optimization program which includes automated exposure control, adjustment of the mA and/or kV according to patient size and/or use of iterative reconstruction technique. 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 Mild chronic small vessel ischemia and cerebral volume loss. No  evidence of acute infarct, hemorrhage, hydrocephalus, or mass. Vascular: No hyperdense vessel or unexpected calcification. Skull: Normal. Negative for fracture or focal lesion. Sinuses/Orbits: No acute finding. IMPRESSION: No acute or interval finding. Electronically Signed   By: Tiburcio Pea M.D.   On: 07/28/2022 06:05    Pending Labs Unresulted Labs (From admission, onward)     Start     Ordered   07/29/22 0500  CBC  Tomorrow morning,   R        07/28/22 0934   07/29/22 0500  Basic metabolic panel  Tomorrow morning,   R        07/28/22 0934   07/29/22 0500  Magnesium  Tomorrow morning,   R        07/28/22 1254   07/28/22 1240  Iron and TIBC  Once,   R        07/28/22 1239   07/28/22 1240  Ferritin  Once,   R        07/28/22 1239   07/28/22 1202  Strep pneumoniae urinary antigen  Once,   R        07/28/22 1201   07/28/22 0438  Occult blood card to lab, stool Provider will collect  Once,   URGENT       Question:  Specimen to be collected by:  Answer:  Provider will collect   07/28/22 0437            Vitals/Pain Today's Vitals   07/28/22  1355 07/28/22 1430 07/28/22 1504 07/28/22 1605  BP: (!) 150/78 (!) 153/76 (!) 152/87 (!) 157/85  Pulse: 91 89 90 86  Resp: 18  16 18   Temp: 98.8 F (37.1 C) 98.7 F (37.1 C)  98.5 F (36.9 C)  TempSrc: Oral Oral  Oral  SpO2: 99% 99% 96% 100%  Weight:      Height:      PainSc:        Isolation Precautions No active isolations  Medications Medications  0.9 %  sodium chloride infusion (Manually program via Guardrails IV Fluids) (has no administration in time range)  acetaminophen (TYLENOL) tablet 650 mg (has no administration in time range)    Or  acetaminophen (TYLENOL) suppository 650 mg (has no administration in time range)  rosuvastatin (CRESTOR) tablet 20 mg (has no administration in time range)  cyanocobalamin (VITAMIN B12) tablet 1,000 mcg (1,000 mcg Oral Given 07/28/22 1113)  azithromycin (ZITHROMAX) tablet 500 mg (500 mg  Oral Given 07/28/22 1242)  cefTRIAXone (ROCEPHIN) 2 g in sodium chloride 0.9 % 100 mL IVPB (0 g Intravenous Stopped 07/28/22 1308)  PARoxetine (PAXIL) tablet 10 mg (10 mg Oral Given 07/28/22 1344)  acetaminophen (TYLENOL) tablet 650 mg (650 mg Oral Given 07/28/22 0806)  phytonadione (VITAMIN K) 10 mg in dextrose 5 % 50 mL IVPB (0 mg Intravenous Stopped 07/28/22 1200)    Mobility walks with device     Focused Assessments Neuro Assessment Handoff:  Swallow screen pass? No  Cardiac Rhythm: Ventricular paced       Neuro Assessment: Exceptions to WDL (Endorses dizziness) Neuro Checks:      Has TPA been given? No If patient is a Neuro Trauma and patient is going to OR before floor call report to 4N Charge nurse: [phone] or [phone]   R Recommendations: See Admitting Provider Note  Report given to:   Additional Notes: aox4, up to bsc with standby assist.   Given 2U PRBC's, repeat cbc pending.   No skin issues observed

## 2022-07-28 NOTE — ED Notes (Signed)
Pt received for care at 1925.  Quietly resting in bed; respirations even and unlabored with no distress noted.  This RN introduced self to pt.  Bed in lowest position, wheels locked.  Call bell within reach.

## 2022-07-28 NOTE — ED Notes (Signed)
Provider notified of current temperature.  No transfusion reaction suspected as pt's pre-blood temp was 100.4 F and 15-minute check was 100.8 F.

## 2022-07-28 NOTE — ED Triage Notes (Signed)
Pt complaining of feeling dizzy today, did fall from standing complaining of lowe abdominal pain and rt ankle pain.  She did have a pacemaker placed 6 weeks ago and it is bouncing all over the place from 76 to 110

## 2022-07-28 NOTE — Consult Note (Addendum)
OBSTETRICS AND GYNECOLOGY ATTENDING CONSULT NOTE  Consult Date: 07/28/2022 Reason for Consult: suspected degenerating fibroid causing symptomatic anemia Consulting Provider: Dr. Delorise Shiner Lau/Dr. Quincy Simmonds    Assessment: [phone] with likely hemorrhagic degenerating fibroid contributing to acute on chronic anemia. Based on timeline of her symptoms (2 weeks of pain, acute worsening of 1+ year of dizziness) and exam today, would suspect that her fibroid has been slowly bleeding for the past couple of weeks. Her fibroid appeared to be degenerating on her MRI 02/16/22, and her supratherapeutic INR may explain why she started bleeding into it.   I reviewed with the patient options for management. Discussed management by monitoring labs, transfusions as needed, and pain control. Reviewed other options would be uterine artery embolization ([place]) versus hysterectomy/myomectomy depending on intraoperative findings. I would not recommend these interventions in light of her current stability. Given her CKD IIIb, I'd be concerned about kidney injury with contrast media if [place] was needed. I'd also be worried about significant morbidity with an exploratory laparotomy & myomectomy/hysterectomy. Renee Payne does not feel these interventions would be in line with her goals of care at this time and agrees with current non-surgical management plan.   I reviewed with the patient that degenerating fibroids in a postmenopausal woman could be sign of a rare malignancy called leiomyosarcoma. There are no other indicators of malignancy on her scans. She reports fatigue and 5-10lb weight loss but attributes these to her recent pacemaker surgery in March. Reviewed that we would need pathologic examination of the mass (usually via surgical exploration) to rule out the diagnosis. Renee Payne reports that she would not pursue cancer treatment regardless, so risk of surgical resection solely for pathology would confer limited  benefit.   The fibroid may or may not be the etiology of her hydronephrosis. Would suspect if it causes obstruction it would be intermittent if uterus/fibroid are mobile. If it is determined that the mild hydronephrosis is significantly impairing her renal function, would favor urologic intervention (ureteral stent/PCN) over fibroid removal due morbidity outlined above.  Recommendations: - Obtain post-transfusion CBC and trend Hgb at least daily - Consider reversal of INR if it appears she is actively bleeding - If it is felt that that this mild right hydronephrosis/hydroureter is significantly contributing to her renal function, could discuss ureteral stent or percutaneous nephrostomy tube with urology. Would not recommend hysterectomy/removal of fibroid at this time due to concerns about morbidity with the procedure. - She can follow up with [facility] [employer] [facility] or [place] since she currently lives in [place]. I have included our office information in her discharge paperwork.   Appreciate care of Renee Payne by her primary team We will continue to round on this patient Please call [phone] [facility] OB[facility] Consult Attending Monday-Friday 8am - 5pm) or [phone] [facility] OB/GYN Attending On Call all day, every day) for any gynecologic at any time.  Thank you for involving [place] in the care of this patient.  Total consultation time including face-to-face time with patient (>50% of time), reviewing chart and documentation: 61 minutes  Harvie Bridge, MD Obstetrician & Gynecologist, [facility], [facility] Consult Phone: [phone]  History of Present Illness: Renee Payne is an 72 y.o. with hx PE on wafarin, CKD, HTN, T2DM and recent pacemaker insertion presenting with dizziness/fall and found to be anemic in s/o degenerating fibroid.   Reports 2 weeks of severe lower abdominal pain resulting in passive  SI  and dizziness for > 1 year. Stood up last night, felt dizzy, and reports that her leg just come out from under her. Fell but no LOC. No vaginal/rectal bleeding.  In ED, she was HDS, but found to have Hgb 5.5 and supratherapeutic INR 5.8. Previously Hgb in 8 range in March. No apparent cause of bleeding until pt had CT A/P that revealed a likely hemorrhagic degeneration fibroid that was larger in appearance compared to MRI in November 2023. There was also possible mass effect from the fibroid on the ureter causing mild hydronephrosis/hydroureter. Gyn consulted to comment on fibroid.  FOBT pending.  Pertinent OB/GYN History: No LMP recorded. Patient is postmenopausal.  Patient Active Problem List   Diagnosis Date Noted   Symptomatic anemia 07/28/2022   CHB (complete heart block) 06/17/2022   CVA (cerebral vascular accident)    Thyroid incidentaloma 11/14/2016   Leukopenia    Essential hypertension 11/13/2016   History of pulmonary embolism 11/13/2016   Anxiety 11/13/2016   Right arm weakness 11/13/2016   Suspected stroke patient last known to be well more than 2 hours ago 11/13/2016   Diabetes mellitus type II, non insulin dependent 11/13/2016   OSA (obstructive sleep apnea) 11/13/2016   Suspected cerebrovascular accident (CVA) 11/13/2016   Past Medical History:  Diagnosis Date   Diabetes mellitus without complication    Hyperlipidemia    Hypertension    Pulmonary emboli    Past Surgical History:  Procedure Laterality Date   IVC FILTER INSERTION     PACEMAKER IMPLANT N/A 06/20/2022   Procedure: PACEMAKER IMPLANT - DUAL CHAMBER;  Surgeon: Lanier Prude, MD;  Location: [facility] INVASIVE [facility];  Service: Cardiovascular;  Laterality: N/A;   History reviewed. No pertinent family history.  Social History:  reports that she has never smoked. She has never used smokeless tobacco. She reports that she does not drink alcohol and does not use drugs.  Allergies:  Allergies  Allergen  Reactions   Ativan [Lorazepam] Other (See Comments)    COMBATIVE AND EXTREMELY VIOLENT   Omeprazole Other (See Comments)    The front of her body felt very warm after one dose of this med.    Medications: No current facility-administered medications on file prior to encounter.   Current Outpatient Medications on File Prior to Encounter  Medication Sig Dispense Refill   acetaminophen (TYLENOL) 325 MG tablet Take 2 tablets (650 mg total) by mouth every 4 (four) hours as needed for headache or mild pain.     aspirin EC 81 MG EC tablet Take 1 tablet (81 mg total) by mouth daily.     Colchicine 0.6 MG CAPS Take 0.6-1.2 mg by mouth See admin instructions. Take 1.2 mg once in the morning then 0.6 mg that night on the first day. Then take 0.6 mg once daily as needed for a gout flare up.     losartan (COZAAR) 50 MG tablet Take 50 mg by mouth daily.     metFORMIN (GLUCOPHAGE) 1000 MG tablet Take 1,000 mg by mouth daily.     rosuvastatin (CRESTOR) 20 MG tablet Take 20 mg by mouth at bedtime.     valsartan (DIOVAN) 160 MG tablet Take 160 mg by mouth daily.     vitamin B-12 (CYANOCOBALAMIN) 1000 MCG tablet Take 1 tablet (1,000 mcg total) by mouth daily. 30 tablet 0   warfarin (COUMADIN) 5 MG tablet Take 0.5-1 tablets (2.5-5 mg total) by mouth See admin instructions. Take 2.5mg  every evening except for  5mg  on Monday and Fridays 90 tablet 0   White Petrolatum-Mineral Oil (EYE LUBRICANT OP) Place 2 drops into both eyes as needed (dry eye).     hydrochlorothiazide (HYDRODIURIL) 12.5 MG tablet Take 12.5 mg by mouth daily. (Patient not taking: Reported on 06/19/2022)     Review of Systems: Pertinent items are noted in HPI.  Focused Physical Examination: BP (!) 144/71   Pulse 91   Temp 99 F (37.2 C) (Oral)   Resp 18   Ht 5\' 1"  (1.549 m)   Wt 79.8 kg   SpO2 100%   BMI 33.25 kg/m  GENERAL: Alert and oriented x 3, resting comfortably in hospital bed PSYCHIATRIC: Normal mood and affect. Normal  behavior. Normal judgment and thought content. CARDIOVASCULAR: Normal heart rate noted RESPIRATORY: Effort normal, no problems with respiration noted. ABDOMEN: Soft, obese, no distention noted. Mild lower abdominal tenderness with deep palpation but no rebound or guarding PELVIC: Deferred  Labs and Imaging: Results for orders placed or performed during the hospital encounter of 07/28/22 (from the past 72 hour(s))  Basic metabolic panel     Status: Abnormal   Collection Time: 07/28/22  3:30 AM  Result Value Ref Range   Sodium 142 135 - 145 mmol/L   Potassium 3.3 (L) 3.5 - 5.1 mmol/L   Chloride 107 98 - 111 mmol/L   CO2 25 22 - 32 mmol/L   Glucose, Bld 105 (H) 70 - 99 mg/dL    Comment: Glucose reference range applies only to samples taken after fasting for at least 8 hours.   BUN 34 (H) 8 - 23 mg/dL   Creatinine, Ser 1.66 (H) 0.44 - 1.00 mg/dL   Calcium 8.8 (L) 8.9 - 10.3 mg/dL   GFR, Estimated 21 (L) >60 mL/min    Comment: (NOTE) Calculated using the CKD-EPI Creatinine Equation (2021)    Anion gap 10 5 - 15    Comment: Performed at [facility], [address] [place], [place] [address]  CBC WITH DIFFERENTIAL     Status: Abnormal   Collection Time: 07/28/22  3:30 AM  Result Value Ref Range   WBC 6.6 4.0 - 10.5 K/uL   RBC 1.90 (L) 3.87 - 5.11 MIL/uL   Hemoglobin 5.5 (LL) 12.0 - 15.0 g/dL    Comment: REPEATED TO VERIFY THIS CRITICAL RESULT HAS VERIFIED AND BEEN CALLED TO KIRK BRANCH RN BY SHERRY GALLOWAY ON 04 11 2024 AT 0402, AND HAS BEEN READ BACK.  THIS CRITICAL RESULT HAS VERIFIED AND BEEN CALLED TO KIRK BRANCH RN BY SHERRY GALLOWAY ON 04 11 2024 AT 0403, AND HAS BEEN READ BACK.     HCT 17.6 (L) 36.0 - 46.0 %   MCV 92.6 80.0 - 100.0 fL   MCH 28.9 26.0 - 34.0 pg   MCHC 31.3 30.0 - 36.0 g/dL   RDW 60.1 (H) 09.3 - 23.5 %   Platelets 253 150 - 400 K/uL   nRBC 0.0 0.0 - 0.2 %   Neutrophils Relative % 74 %   Neutro Abs 5.0 1.7 - 7.7 K/uL   Lymphocytes Relative 11 %    Lymphs Abs 0.7 0.7 - 4.0 K/uL   Monocytes Relative 13 %   Monocytes Absolute 0.8 0.1 - 1.0 K/uL   Eosinophils Relative 1 %   Eosinophils Absolute 0.0 0.0 - 0.5 K/uL   Basophils Relative 1 %   Basophils Absolute 0.0 0.0 - 0.1 K/uL   Immature Granulocytes 0 %   Abs Immature Granulocytes 0.02  0.00 - 0.07 K/uL    Comment: Performed at [facility], [address] [place], [place] [address]  ABO/Rh     Status: None   Collection Time: 07/28/22  3:30 AM  Result Value Ref Range   ABO/RH(D)      O POS Performed at [facility], [address] [place], [place] [address]   CBG monitoring, ED     Status: None   Collection Time: 07/28/22  3:33 AM  Result Value Ref Range   Glucose-Capillary 99 70 - 99 mg/dL    Comment: Glucose reference range applies only to samples taken after fasting for at least 8 hours.  Prepare RBC (crossmatch)     Status: None   Collection Time: 07/28/22  4:12 AM  Result Value Ref Range   Order Confirmation      ORDER PROCESSED BY BLOOD BANK Performed at [facility], [address] [place], [place] [address]   Protime-INR     Status: Abnormal   Collection Time: 07/28/22  4:20 AM  Result Value Ref Range   Prothrombin Time 51.5 (H) 11.4 - 15.2 seconds   INR 5.8 (HH) 0.8 - 1.2    Comment: REPEATED TO VERIFY CRITICAL RESULT CALLED TO, READ BACK BY AND VERIFIED WITH:  W. TYLER RN, [id number], 07/28/22, EADEDOKUN (NOTE) INR goal varies based on device and disease states. Performed at [facility], [address] [place], [place] [address]   Type and screen [facility]     Status: None (Preliminary result)   Collection Time: 07/28/22  4:20 AM  Result Value Ref Range   ABO/RH(D) O POS    Antibody Screen NEG    Sample Expiration 07/31/2022,2359    Unit Number [id number]    Blood Component Type RBC, LR IRR    Unit division 00    Status of Unit ISSUED    Transfusion Status OK TO TRANSFUSE    Crossmatch Result Compatible     Unit Number [id number]    Blood Component Type RBC LR PHER1    Unit division 00    Status of Unit ISSUED    Transfusion Status OK TO TRANSFUSE    Crossmatch Result      Compatible Performed at [facility], [address] [place], [place] [address]   SARS Coronavirus 2 by RT PCR (hospital order, performed in [facility] hospital lab) *cepheid single result test* Anterior Nasal Swab     Status: None   Collection Time: 07/28/22  8:14 AM   Specimen: Anterior Nasal Swab  Result Value Ref Range   SARS Coronavirus 2 by RT PCR NEGATIVE NEGATIVE    Comment: Performed at [facility], [address] [place], [place] [address]  Urinalysis, Routine w reflex microscopic -Urine, Clean Catch     Status: Abnormal   Collection Time: 07/28/22 10:32 AM  Result Value Ref Range   Color, Urine YELLOW YELLOW   APPearance CLOUDY (A) CLEAR   Specific Gravity, Urine 1.016 1.005 - 1.030   pH 5.0 5.0 - 8.0   Glucose, UA NEGATIVE NEGATIVE mg/dL   Hgb urine dipstick MODERATE (A) NEGATIVE   Bilirubin Urine NEGATIVE NEGATIVE   Ketones, ur NEGATIVE NEGATIVE mg/dL   Protein, ur 347100 (A) NEGATIVE mg/dL   Nitrite NEGATIVE NEGATIVE   Leukocytes,Ua TRACE (A) NEGATIVE   RBC / HPF 0-5 0 - 5 RBC/hpf   WBC, UA 6-10 0 - 5 WBC/hpf  Bacteria, UA FEW (A) NONE SEEN   Squamous Epithelial / HPF 6-10 0 - 5 /HPF   Mucus PRESENT    Hyaline Casts, UA PRESENT    Amorphous Crystal PRESENT     Comment: Performed at [facility], [address], [place], [place] [address]    DG CHEST PORT 1 VIEW  Result Date: 07/28/2022 CLINICAL DATA:  Provided history: Cough. EXAM: PORTABLE CHEST 1 VIEW COMPARISON:  Prior chest radiographs 06/21/2022 and earlier. FINDINGS: Left chest dual lead implantable cardiac pacemaker. Borderline cardiomegaly. Ill-defined opacities within the medial left lung base. No appreciable airspace consolidation on the right. No evidence of pleural effusion or pneumothorax. No acute osseous  abnormality identified. Degenerative changes of the spine. IMPRESSION: 1. Ill-defined opacities within the left lung base, which may reflect atelectasis or pneumonia. 2. Borderline cardiomegaly. Electronically Signed   By: Jackey Loge D.O.   On: 07/28/2022 10:30   CT ABDOMEN PELVIS WO CONTRAST  Result Date: 07/28/2022 CLINICAL DATA:  Abdominal pain.  Dizziness. EXAM: CT ABDOMEN AND PELVIS WITHOUT CONTRAST TECHNIQUE: Multidetector CT imaging of the abdomen and pelvis was performed following the standard protocol without IV contrast. RADIATION DOSE REDUCTION: This exam was performed according to the departmental dose-optimization program which includes automated exposure control, adjustment of the mA and/or kV according to patient size and/or use of iterative reconstruction technique. COMPARISON:  MRI abdomen and pelvis 02/16/2022 FINDINGS: Lower chest: No acute abnormality. Hepatobiliary: No focal liver abnormality. Stone within the gallbladder fundus measures 1.5 cm. No gallbladder wall thickening or inflammation. No significant bile duct dilatation Pancreas: Unremarkable. No pancreatic ductal dilatation or surrounding inflammatory changes. Spleen: Normal in size without focal abnormality. Adrenals/Urinary Tract: Normal adrenal glands. Bilateral kidney cysts are again noted. Largest arises off the inferior pole of the right kidney measured 4.4 cm. No follow-up imaging recommended. Mild right hydronephrosis and proximal hydroureter. No urinary tract calculi. Bladder appears normal for the degree of distension. Stomach/Bowel: Stomach appears normal. The appendix is visualized and appears normal. No bowel wall thickening, inflammation or distension. Vascular/Lymphatic: Aortic atherosclerosis. No abdominopelvic adenopathy. Reproductive: Again seen is a large, dominant exophytic fibroid arising off the uterine fundus which is slightly eccentric right of the midline. This measures 12.7 x 10.8 by 11.7 cm (volume = 840  cm^3). On the previous MRI this measured 8.7 by 5.9 by 7.6 cm (volume = 200 cm^3). There is mixed attenuation within this mass with areas of increased density concerning for hemorrhage into a large fibroid. Other: There is no free fluid or fluid collections identified within the abdomen or pelvis. No signs of pneumoperitoneum. Musculoskeletal: No acute or significant osseous findings. IMPRESSION: 1. Interval enlargement large, dominant subserosal fibroid within the uterine fundus. Heterogeneous increased in density within this mass is blood products findings are favored to represent hemorrhagic degeneration of the uterine fibroid. CTA of the abdomen and pelvis may be helpful to assess for active bleeding. 2. Mild right hydronephrosis and proximal hydroureter. This is favored to reflect mass effect upon the right ureter from enlarged fibroid. 3. Gallstone without evidence for acute cholecystitis. 4. Aortic Atherosclerosis (ICD10-I70.0). Electronically Signed   By: Signa Kell M.D.   On: 07/28/2022 06:36   CT Head Wo Contrast  Result Date: 07/28/2022 CLINICAL DATA:  Head trauma EXAM: CT HEAD WITHOUT CONTRAST TECHNIQUE: Contiguous axial images were obtained from the base of the skull through the vertex without intravenous contrast. RADIATION DOSE REDUCTION: This exam was performed according to the departmental dose-optimization program which  includes automated exposure control, adjustment of the mA and/or kV according to patient size and/or use of iterative reconstruction technique. 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 Mild chronic small vessel ischemia and cerebral volume loss. No evidence of acute infarct, hemorrhage, hydrocephalus, or mass. Vascular: No hyperdense vessel or unexpected calcification. Skull: Normal. Negative for fracture or focal lesion. Sinuses/Orbits: No acute finding.  IMPRESSION: No acute or interval finding. Electronically Signed   By: Tiburcio Pea M.D.   On: 07/28/2022 06:05

## 2022-07-28 NOTE — ED Provider Notes (Signed)
Patient care assumed from [facility] [place] [facility] at shift handoff.  See her notes for full details  In short, 72 year old female patient presented to the emergency department secondary to a fall.  Patient reportedly stood up from toilet lightheaded and fell, feeling that her right leg gave out.  Workup prior to my assumption of care found patient to be anemic with a hemoglobin of 5.5.  Blood products were transfused.  CT abdomen pelvis and CT head performed.  CT abdomen pelvis showed possible bleeding degenerative uterine fibroid.  At time of my assumption of care previous provider was awaiting call from medicine service for admission Physical Exam  BP (!) 142/81   Pulse (!) 104   Temp (!) 100.8 F (38.2 C) (Oral)   Resp 17   Ht 5\' 1"  (1.549 m)   Wt 79.8 kg   SpO2 100%   BMI 33.25 kg/m   Physical Exam  Procedures  Procedures  ED Course / MDM    Medical Decision Making Amount and/or Complexity of Data Reviewed Labs: ordered. Radiology: ordered. ECG/medicine tests: ordered.  Risk OTC drugs. Prescription drug management.   Discussed the case with internal medicine teaching service who agreed to see the patient for admission.       Darrick Grinder, PA-C 07/28/22 [id number]    Tegeler, Canary Brim, MD 07/28/22 [phone]

## 2022-07-28 NOTE — ED Notes (Signed)
Pt assisted to bedside commode with minimal assistance

## 2022-07-28 NOTE — ED Notes (Signed)
Up to bsc with min assist

## 2022-07-29 ENCOUNTER — Other Ambulatory Visit (HOSPITAL_COMMUNITY): Payer: Self-pay

## 2022-07-29 DIAGNOSIS — R791 Abnormal coagulation profile: Secondary | ICD-10-CM

## 2022-07-29 DIAGNOSIS — N1832 Chronic kidney disease, stage 3b: Secondary | ICD-10-CM

## 2022-07-29 DIAGNOSIS — R4589 Other symptoms and signs involving emotional state: Secondary | ICD-10-CM

## 2022-07-29 DIAGNOSIS — D649 Anemia, unspecified: Secondary | ICD-10-CM | POA: Diagnosis not present

## 2022-07-29 DIAGNOSIS — N179 Acute kidney failure, unspecified: Secondary | ICD-10-CM

## 2022-07-29 DIAGNOSIS — D252 Subserosal leiomyoma of uterus: Secondary | ICD-10-CM

## 2022-07-29 LAB — CBC
HCT: 23.1 % — ABNORMAL LOW (ref 36.0–46.0)
HCT: 23.8 % — ABNORMAL LOW (ref 36.0–46.0)
Hemoglobin: 7.7 g/dL — ABNORMAL LOW (ref 12.0–15.0)
Hemoglobin: 7.7 g/dL — ABNORMAL LOW (ref 12.0–15.0)
MCH: 28.3 pg (ref 26.0–34.0)
MCH: 28.9 pg (ref 26.0–34.0)
MCHC: 32.4 g/dL (ref 30.0–36.0)
MCHC: 33.3 g/dL (ref 30.0–36.0)
MCV: 86.8 fL (ref 80.0–100.0)
MCV: 87.5 fL (ref 80.0–100.0)
Platelets: 252 10*3/uL (ref 150–400)
Platelets: 259 10*3/uL (ref 150–400)
RBC: 2.66 MIL/uL — ABNORMAL LOW (ref 3.87–5.11)
RBC: 2.72 MIL/uL — ABNORMAL LOW (ref 3.87–5.11)
RDW: 15.9 % — ABNORMAL HIGH (ref 11.5–15.5)
RDW: 16.2 % — ABNORMAL HIGH (ref 11.5–15.5)
WBC: 7.3 10*3/uL (ref 4.0–10.5)
WBC: 7.9 10*3/uL (ref 4.0–10.5)
nRBC: 0 % (ref 0.0–0.2)
nRBC: 0 % (ref 0.0–0.2)

## 2022-07-29 LAB — TYPE AND SCREEN
ABO/RH(D): O POS
Antibody Screen: NEGATIVE

## 2022-07-29 LAB — BPAM RBC
Blood Product Expiration Date: 202404142359
Blood Product Expiration Date: 202405072359
ISSUE DATE / TIME: 202404111328
Unit Type and Rh: 5100
Unit Type and Rh: 9500

## 2022-07-29 LAB — BASIC METABOLIC PANEL
Anion gap: 9 (ref 5–15)
BUN: 28 mg/dL — ABNORMAL HIGH (ref 8–23)
CO2: 24 mmol/L (ref 22–32)
Calcium: 9 mg/dL (ref 8.9–10.3)
Chloride: 107 mmol/L (ref 98–111)
Creatinine, Ser: 2.04 mg/dL — ABNORMAL HIGH (ref 0.44–1.00)
GFR, Estimated: 25 mL/min — ABNORMAL LOW (ref >60)
Glucose, Bld: 95 mg/dL (ref 70–99)
Potassium: 3.5 mmol/L (ref 3.5–5.1)
Sodium: 140 mmol/L (ref 135–145)

## 2022-07-29 LAB — STREP PNEUMONIAE URINARY ANTIGEN: Strep Pneumo Urinary Antigen: NEGATIVE

## 2022-07-29 LAB — PROTIME-INR
INR: 1.9 — ABNORMAL HIGH (ref 0.8–1.2)
Prothrombin Time: 21.2 seconds — ABNORMAL HIGH (ref 11.4–15.2)

## 2022-07-29 LAB — GLUCOSE, CAPILLARY: Glucose-Capillary: 97 mg/dL (ref 70–99)

## 2022-07-29 LAB — MAGNESIUM: Magnesium: 2.2 mg/dL (ref 1.7–2.4)

## 2022-07-29 MED ORDER — GLUCERNA SHAKE PO LIQD
237.0000 mL | Freq: Three times a day (TID) | ORAL | Status: DC
  Administered 2022-07-29 – 2022-07-30 (×3): 237 mL via ORAL

## 2022-07-29 MED ORDER — LIDOCAINE 5 % EX PTCH
1.0000 | MEDICATED_PATCH | CUTANEOUS | Status: DC
  Administered 2022-07-29: 1 via TRANSDERMAL
  Filled 2022-07-29: qty 1

## 2022-07-29 MED ORDER — PAROXETINE HCL 10 MG PO TABS
10.0000 mg | ORAL_TABLET | Freq: Every day | ORAL | 0 refills | Status: DC
  Filled 2022-07-29: qty 30, 30d supply, fill #0

## 2022-07-29 MED ORDER — POTASSIUM CHLORIDE CRYS ER 20 MEQ PO TBCR
40.0000 meq | EXTENDED_RELEASE_TABLET | Freq: Once | ORAL | Status: AC
  Administered 2022-07-29: 40 meq via ORAL
  Filled 2022-07-29: qty 2

## 2022-07-29 MED ORDER — ACETAMINOPHEN 500 MG PO TABS
1000.0000 mg | ORAL_TABLET | Freq: Three times a day (TID) | ORAL | Status: DC | PRN
  Administered 2022-07-31: 1000 mg via ORAL
  Filled 2022-07-29: qty 2

## 2022-07-29 MED ORDER — DICLOFENAC SODIUM 1 % EX GEL: 2.0000 g | Freq: Four times a day (QID) | CUTANEOUS | Status: DC | PRN

## 2022-07-29 MED ORDER — APIXABAN 5 MG PO TABS
5.0000 mg | ORAL_TABLET | Freq: Two times a day (BID) | ORAL | Status: DC
  Administered 2022-07-29 – 2022-07-31 (×5): 5 mg via ORAL
  Filled 2022-07-29 (×5): qty 1

## 2022-07-29 MED ORDER — ASPIRIN 81 MG PO TBEC
81.0000 mg | DELAYED_RELEASE_TABLET | Freq: Every day | ORAL | Status: DC
  Administered 2022-07-29 – 2022-07-31 (×3): 81 mg via ORAL
  Filled 2022-07-29 (×3): qty 1

## 2022-07-29 MED ORDER — AMOXICILLIN-POT CLAVULANATE 500-125 MG PO TABS
1.0000 | ORAL_TABLET | Freq: Two times a day (BID) | ORAL | 0 refills | Status: AC
  Filled 2022-07-29: qty 5, 3d supply, fill #0

## 2022-07-29 MED ORDER — DICLOFENAC SODIUM 1 % EX GEL: 4.0000 g | Freq: Four times a day (QID) | CUTANEOUS | Status: DC | PRN

## 2022-07-29 MED ORDER — APIXABAN 5 MG PO TABS
5.0000 mg | ORAL_TABLET | Freq: Two times a day (BID) | ORAL | 0 refills | Status: DC
  Filled 2022-07-29: qty 60, 30d supply, fill #0

## 2022-07-29 MED ORDER — AMOXICILLIN-POT CLAVULANATE 875-125 MG PO TABS: 1.0000 | ORAL_TABLET | Freq: Two times a day (BID) | ORAL | Status: DC

## 2022-07-29 MED ORDER — AMOXICILLIN-POT CLAVULANATE 500-125 MG PO TABS
1.0000 | ORAL_TABLET | Freq: Two times a day (BID) | ORAL | Status: DC
  Administered 2022-07-29 – 2022-07-31 (×5): 1 via ORAL
  Filled 2022-07-29 (×6): qty 1

## 2022-07-29 NOTE — TOC Progression Note (Signed)
Discharge medications (3) are being stored in the main pharmacy on the ground floor until patient is ready for discharge.   

## 2022-07-29 NOTE — Progress Notes (Signed)
Hospital day#1 Subjective:    Overnight Events: No major events overnight  Doing well overnight. Patient has been able to ambulate without lightheaded, changes in vision or dyspnea. Mild pain on R hip; has been going on since she got the pacemaker placed slightly worse now, which patient believes is from laying on R side.  Continues lower abdominal pain, though this is much improved from yesterday. No dysuria or polyuria. BM yesterday.  Denies blood per urine, stool, or mouth.   Endorses low appetite. Her mood is improved from yesterday. She had been thinking she'd better off dead but does not have a plan or weapons to commit suicide. Reports feeling overwhelmed by medical conditions and is frustrated by length of wait to follow up with PCP.   Discussed history of PE and CVA and need for warfarin but patient was unsure of why this medication. Open to the idea of switching to DOAC.   Objective:  Vital signs in last 24 hours: Vitals:   07/28/22 2205 07/28/22 2300 07/28/22 2332 07/29/22 0442  BP: (!) 164/82  (!) 155/88 (!) 148/70  Pulse: 95  78 88  Resp: Temp: 98 F (36.7 C)  98.4 F (36.9 C) 100.3 F (37.9 C)  TempSrc: Oral  Oral   SpO2: 93%  96% 93%  Weight: 77.7 kg 78.4 kg    Height:  (1.549 m)      Supplemental O2: Room Air SpO2: 93 % O2 Flow Rate (L/min): 2 L/min Filed Weights   07/28/22 0216 07/28/22 2205 07/28/22 2300  Weight: 79.8 kg 77.7 kg 78.4 kg    Physical Exam:  Constitutional:Chronically ill-appearing woman sitting in chair, in no acute distress HENT: normocephalic atraumatic, moist mucous membranes Neck: supple Cardiovascular: regular rate and rhythm, no m/r/g Pulmonary/Chest: normal work of breathing on room air, lungs clear to auscultation bilaterally. No wheezing Abdominal: Normal BS, soft, non-distended. *Mild tenderness to palpation to lower abdomen. No rebound tenderness. MSK: normal bulk and tone. Mild tenderness to outer R hip  without erythema or edema. No pitting edema Neurological: alert & oriented x 3, moving all extremities Skin: warm and dry Psych: Depressed mood and affect. No active SI    Intake/Output Summary (Last 24 hours) at 07/29/2022 1478 Last data filed at 07/28/2022 1605 Gross per 24 hour  Intake 789.42 ml  Output 400 ml  Net 389.42 ml   Net IO Since Admission: 389.42 mL [07/29/22 0608]  Pertinent Labs:    Latest Ref Rng & Units 07/29/2022   12:04 AM 07/28/2022    3:30 AM 06/20/2022    9:01 AM  CBC  WBC 4.0 - 10.5 K/uL 7.9  6.6  3.5   Hemoglobin 12.0 - 15.0 g/dL 7.7  5.5  8.8   Hematocrit 36.0 - 46.0 % 23.8  17.6  27.8   Platelets 150 - 400 K/uL 252  253  198        Latest Ref Rng & Units 07/29/2022   12:04 AM 07/28/2022    3:30 AM 06/20/2022    9:01 AM  CMP  Glucose 70 - 99 mg/dL 95  295  84   BUN 8 - 23 mg/dL 28  34  27   Creatinine 0.44 - 1.00 mg/dL 6.21  3.08  6.57   Sodium 135 - 145 mmol/L 140  142  141   Potassium 3.5 - 5.1 mmol/L 3.5  3.3  3.6   Chloride 98 - 111 mmol/L 107  107  110   CO2 22 - 32 mmol/L 24  25  21    Calcium 8.9 - 10.3 mg/dL 9.0  8.8  9.2     Imaging: DG CHEST PORT 1 VIEW  Result Date: 07/28/2022 CLINICAL DATA:  Provided history: Cough. EXAM: PORTABLE CHEST 1 VIEW COMPARISON:  Prior chest radiographs 06/21/2022 and earlier. FINDINGS: Left chest dual lead implantable cardiac pacemaker. Borderline cardiomegaly. Ill-defined opacities within the medial left lung base. No appreciable airspace consolidation on the right. No evidence of pleural effusion or pneumothorax. No acute osseous abnormality identified. Degenerative changes of the spine. IMPRESSION: 1. Ill-defined opacities within the left lung base, which may reflect atelectasis or pneumonia. 2. Borderline cardiomegaly. Electronically Signed   By: Jackey Loge D.O.   On: 07/28/2022 10:30    Assessment/Plan:   Principal Problem:   Symptomatic anemia   Patient Summary: Renee Payne is a 72 y.o. 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 Patient is also being managed for community acquired pneumonia with antibiotics.    Symptomatic Anemia Exophytic, subserosal uterine fibroid Presented with recent worsening of dizziness, which has been present for ~1 year likely in settingof slow bleeding uterine fibroids. She received 10 mg IV vitamin K for wafarin reversal as patient was supra therapeutic and there was concern for active bleeding. Hgb on admission 5.5 s/p 2 units pRBC with post transfusion H/H 7.7.  OB/Gyn consulted; did not recommend interventions at this time and outpatient follow up. Repeat Hgb 7.7 this AM and INR of 1.9. This patient could benefit from outpatient IR consultation for embolization if not amenable for surgery.  -Follow up Hgb this AM -Trend Hgb daily -No procedures scheduled at this time   Community acquired pneumonia Patient started on CTX and azithromycin  on admission. Urinary strep antigen pending. Patient remains at RA with saturations >93%. Patient transitioned to Augmentin BID and continues on azithromycin -CTX started 4/11 - d/c'd  4/12 -Started on Augmentin 500-125 mg BID  - to end 08/01/2022 -Continue Azithromycin 500 mg to end 07/30/2022  AKI on CKDIIB  Baseline Cr. 1.9-2.0. Admission Cr 2.4. Patient with R hydronephrosis/hydroureter  2/2 uterine fibroid obstruction likely contributing to AKI. Today Cr. 2.04, GFR 25, which seems to be back to patient's baseline.  Will recommend outpatient urology follow up for stent placement if decline in renal function post discharge in setting of enlarging fibroid -would consider urology follow up for ureteral stent  History of PE on chronic warfarin  Hgb currently stable. Patient previously on chronic  warfarin; unable to see why patient had not been transitioned to DOAC prior to this admission. It seems to have been carried over the years. Co-pay for apixaban with her insurance is $11. Will transition to it today -Start Eliquis 5 mg BID  HTN  History of CVA HLD Home Losartan 50 mg daily held in setting of normotension.  Continue ASA 81 mg -Continue Crestor 20 mg -Transitioned to Eliquis 5 mg BID -Will restart Losartan 50 mg daily if persistently hypertensive  Depression Passive SI. Better mood this morning. Patient will benefit from outpatient psychiatry and psychotherapy. Initiated on pharmacologic treatment on admission. -CTM -continue Paroxetine 10 mg daily  T2DM  Has not required SSI  Diet: Heart Healthy VTE: NOAC Code: DNR PT/OT recs: pending  Dispo:  Anticipated discharge to Home in 1 days pending clinical improvement.   Morene Crocker, MD Internal Medicine Resident PGY-1 Please contact the on call pager after 5 pm and on weekends at [phone].

## 2022-07-29 NOTE — Discharge Instructions (Signed)

## 2022-07-29 NOTE — Evaluation (Addendum)
Physical Therapy Evaluation Patient Details Name: Renee Payne MRN: [id number] DOB: 07-20-1950 Today's Date: 07/29/2022  History of Present Illness  72 yo female presents to [facility] on 4/11 with dizziness, fall, abdominal and R ankle pain. CTH negative for acute findings.  CT abdomen pelvis showed possible bleeding degenerative uterine fibroid. PMH includes pacemaker placement x6 weeks ago, DMII, high cholesterol, CKD III, CVA, PE warfarin.  Clinical Impression   Pt presents with generalized weakness, abdominal and RLE pain, impaired balance with history of falls, and decreased activity tolerance. Pt to benefit from acute PT to address deficits. Pt ambulated short room distance, limited by symptomatic orthostatic hypotension with SBP drop to 40+ during session. PT anticipates good functional improvement acutely with resolution of anemia, orthostatic hypotension; was already receiving HHPT/OT and would like to continue this. PT to progress mobility as tolerated, and will continue to follow acutely.         Recommendations for follow up therapy are one component of a multi-disciplinary discharge planning process, led by the attending physician.  Recommendations may be updated based on patient status, additional functional criteria and insurance authorization.  Follow Up Recommendations       Assistance Recommended at Discharge Intermittent Supervision/Assistance  Patient can return home with the following  A little help with walking and/or transfers;A little help with bathing/dressing/bathroom    Equipment Recommendations None recommended by PT  Recommendations for Other Services       Functional Status Assessment Patient has had a recent decline in their functional status and demonstrates the ability to make significant improvements in function in a reasonable and predictable amount of time.     Precautions / Restrictions Precautions Precautions: Fall Restrictions Weight Bearing  Restrictions: No      Mobility  Bed Mobility Overal bed mobility: Modified Independent             General bed mobility comments: pt utilized hand on the right bedrail to pull self to EOB; independently swung legs to right side    Transfers Overall transfer level: Needs assistance Equipment used: Rolling walker (2 wheels) Transfers: Sit to/from Stand Sit to Stand: Min guard           General transfer comment: cueing for hand placement to power up; some cueing for turning RW    Ambulation/Gait Ambulation/Gait assistance: Min guard Gait Distance (Feet): 10 Feet Assistive device: Rolling walker (2 wheels) Gait Pattern/deviations: Step-through pattern, Decreased stride length, Trunk flexed Gait velocity: decr     General Gait Details: pt utilized RW to ambulate from right side of bed to [facility] on left side, cues for placement in RW and room navigation  Stairs            Wheelchair Mobility    Modified Rankin (Stroke Patients Only)       Balance Overall balance assessment: Needs assistance Sitting-balance support: No upper extremity supported, Feet supported Sitting balance-Leahy Scale: Fair     Standing balance support: Bilateral upper extremity supported, During functional activity Standing balance-Leahy Scale: Poor Standing balance comment: reliant on external assist                             Pertinent Vitals/Pain Pain Assessment Pain Assessment: Faces Faces Pain Scale: Hurts little more Pain Location: R lower abdomen, RLE Pain Descriptors / Indicators: Sharp, Discomfort Pain Intervention(s): Monitored during session, Repositioned, Limited activity within patient's tolerance    Home Living Family/patient expects to be  discharged to:: Private residence Living Arrangements: Other relatives Available Help at Discharge: Family Type of Home: House Home Access: Ramped entrance       Home Layout: One level Home Equipment: Rollator (4  wheels) Additional Comments: rollator    Prior Function Prior Level of Function : Independent/Modified Independent;History of Falls (last six months)             Mobility Comments: doesn't drive; sister drives and lives with her (works half day) ADLs Comments: independent     Higher education careers adviser Dominance   Dominant Hand: Right    Extremity/Trunk Assessment   Upper Extremity Assessment Upper Extremity Assessment: Defer to OT evaluation    Lower Extremity Assessment Lower Extremity Assessment: Generalized weakness    Cervical / Trunk Assessment Cervical / Trunk Assessment: Normal  Communication   Communication: No difficulties  Cognition Arousal/Alertness: Awake/alert Behavior During Therapy: WFL for tasks assessed/performed Overall Cognitive Status: Within Functional Limits for tasks assessed Area of Impairment: Problem solving                             Problem Solving: Slow processing General Comments: periods of delayed responses        General Comments General comments (skin integrity, edema, etc.): + orthostatic hypotension (see previous PT note)    Exercises     Assessment/Plan    PT Assessment Patient needs continued PT services  PT Problem List Decreased strength;Decreased mobility;Decreased safety awareness;Decreased activity tolerance;Decreased balance;Decreased knowledge of use of DME;Pain;Cardiopulmonary status limiting activity       PT Treatment Interventions DME instruction;Therapeutic activities;Gait training;Therapeutic exercise;Patient/family education;Balance training;Stair training;Functional mobility training;Neuromuscular re-education    PT Goals (Current goals can be found in the Care Plan section)  Acute Rehab PT Goals Patient Stated Goal: home PT Goal Formulation: With patient Time For Goal Achievement: 08/12/22 Potential to Achieve Goals: Good    Frequency Min 3X/week     Co-evaluation               AM-PAC PT "6  Clicks" Mobility  Outcome Measure Help needed turning from your back to your side while in a flat bed without using bedrails?: A Little Help needed moving from lying on your back to sitting on the side of a flat bed without using bedrails?: A Little Help needed moving to and from a bed to a chair (including a wheelchair)?: A Little Help needed standing up from a chair using your arms (Renee.g., wheelchair or bedside chair)?: A Little Help needed to walk in hospital room?: A Little Help needed climbing 3-5 steps with a railing? : A Lot 6 Click Score: 17    End of Session Equipment Utilized During Treatment: Gait belt Activity Tolerance: Treatment limited secondary to medical complications (Comment) (orthostatic hypotension) Patient left: in chair;with chair alarm set;with call bell/phone within reach Nurse Communication: Mobility status PT Visit Diagnosis: Other abnormalities of gait and mobility (R26.89);Muscle weakness (generalized) (M62.81)    Time: 1610-9604 PT Time Calculation (min) (ACUTE ONLY): 42 min   Charges:   PT Evaluation $PT Eval Low Complexity: 1 Low PT Treatments $Therapeutic Activity: 23-37 mins        Renee Payne, PT DPT Acute Rehabilitation Services Pager [phone]  Office [phone]   Renee Payne Renee Payne 07/29/2022, 11:18 AM

## 2022-07-29 NOTE — TOC Benefit Eligibility Note (Signed)
Patient Product/process development scientist completed.    The patient is currently admitted and upon discharge could be taking Eliquis 5 mg.  The current 30 day co-pay is $11.20.   The patient is insured through [employer] Part D   This test claim was processed through [facility]- copay amounts may vary at other pharmacies due to pharmacy/plan contracts, or as the patient moves through the different stages of their insurance plan.  Roland Earl, CPHT Pharmacy Patient Advocate Specialist [facility] Pharm[facility] Patient Advocate Team Direct Number: [phone]  Fax: [phone]

## 2022-07-29 NOTE — Progress Notes (Signed)
   07/29/22 0900  Orthostatic Lying   BP- Lying 121/80  Pulse- Lying 84  Orthostatic Sitting  BP- Sitting 134/73  Pulse- Sitting 93  Orthostatic Standing at 0 minutes  BP- Standing at 0 minutes 91/71  Pulse- Standing at 0 minutes 87  Orthostatic Standing at 3 minutes  BP- Standing at 3 minutes (!) 85/66  Pulse- Standing at 3 minutes 96    BP and HR after sitting with Les elevated x3 minutes: 131/73, 83 bpm   Marye Round, PT DPT Acute Rehabilitation Services Pager [phone]  Office [phone]

## 2022-07-29 NOTE — Progress Notes (Signed)
PHARMACY NOTE:  ANTIMICROBIAL RENAL DOSAGE ADJUSTMENT  Current antimicrobial regimen includes a mismatch between antimicrobial dosage and estimated renal function.  As per policy approved by the Pharmacy & Therapeutics and Medical Executive Committees, the antimicrobial dosage will be adjusted accordingly.  Current antimicrobial dosage:  augmentin 875-125 BID  Indication: PNA  Renal Function:  Estimated Creatinine Clearance: 22.9 mL/min (A) (by C-G formula based on SCr of 2.04 mg/dL (H)).   Antimicrobial dosage has been changed to:  500-125 BID     Thank you for allowing pharmacy to be a part of this patient's care.  Alphia Moh, PharmD, BCPS, BCCP Clinical Pharmacist  Please check AMION for all [facility] Pharmacy phone numbers After 10:00 PM, call Main Pharmacy [phone]

## 2022-07-29 NOTE — Evaluation (Signed)
Occupational Therapy Evaluation Patient Details Name: Renee Payne MRN: [id number] DOB: [birth year] Today's Date: 07/29/2022   History of Present Illness 72 yo female presents to [facility] on 4/11 with dizziness, fall, abdominal and R ankle pain. CTH negative for acute findings.  CT abdomen pelvis showed possible bleeding degenerative uterine fibroid. PMH includes pacemaker placement x6 weeks ago, DMII, high cholesterol, CKD III, CVA, PE warfarin.   Clinical Impression   Pt currently with functional limitations due to the deficits listed below (see OT Problem List). Prior to admit, pt was living with her sister, independent Mod I level for ADL tasks and functional mobility. Pt will benefit from acute skilled OT to increase their safety and independence with ADL and functional mobility for ADL to facilitate discharge. Due to pt's report of right hip pain, left scapula pain, and orthostatic BP during PT eval, bed level evaluation was completed. Completed manual therapy and warm compress provided to address pain. Pt reports improved pain level at end of session. Recommending HHOT once discharged to further increase independence with ADL tasks and to address pain in left scapula. Recommended use of pillow in between legs when side sleeping with left knee bent to decrease amount of pressure to right hip. Pt verbalized understanding. OT will continue to follow patient acutely.        Recommendations for follow up therapy are one component of a multi-disciplinary discharge planning process, led by the attending physician.  Recommendations may be updated based on patient status, additional functional criteria and insurance authorization.   Assistance Recommended at Discharge PRN  Patient can return home with the following Assist for transportation;Assistance with cooking/housework    Functional Status Assessment  Patient has had a recent decline in their functional status and demonstrates the ability to  make significant improvements in function in a reasonable and predictable amount of time.  Equipment Recommendations  Other (comment) (TBD)       Precautions / Restrictions Precautions Precautions: Fall Restrictions Weight Bearing Restrictions: No      Mobility Bed Mobility Overal bed mobility:  (defer to PT evaluation)    Patient Response: Flat affect  Transfers Overall transfer level:  (defer to PT evaluation)     Balance Overall balance assessment:  (defer to PT evaluaton)                ADL either performed or assessed with clinical judgement   ADL Overall ADL's : Needs assistance/impaired       General ADL Comments: Pt requires min-SBA for all BADL tasks at this time.     Vision Baseline Vision/History: 1 Wears glasses Ability to See in Adequate Light: 0 Adequate Patient Visual Report: No change from baseline Vision Assessment?: No apparent visual deficits            Pertinent Vitals/Pain Pain Assessment Pain Assessment: Faces Faces Pain Scale: Hurts whole lot Pain Location: left scapula, right hip flexor/quad region Pain Descriptors / Indicators: Tender, Grimacing, Sharp, Discomfort Pain Intervention(s): Monitored during session, Limited activity within patient's tolerance, Other (comment) (manual therapy)     Hand Dominance Right   Extremity/Trunk Assessment Upper Extremity Assessment Upper Extremity Assessment: LUE deficits/detail LUE Deficits / Details: reported left scapula pain - new onset. Moderate trigger point palpated mid medial border of scapula. A/ROM [facility]   Lower Extremity Assessment Lower Extremity Assessment: RLE deficits/detail RLE Deficits / Details: reported right hip flexor/quad region pain with palpation. Min fascial restrictions noted.   Cervical / Trunk Assessment Cervical / Trunk  Assessment: Normal   Communication Communication Communication: No difficulties   Cognition Arousal/Alertness: Awake/alert Behavior During  Therapy: WFL for tasks assessed/performed, Flat affect Overall Cognitive Status: Within Functional Limits for tasks assessed Area of Impairment: Problem solving            Problem Solving: Slow processing General Comments: periods of delayed responses     General Comments  + orthostatic hypotension (see previous PT note)    Exercises Other Exercises Other Exercises: Myofascial release completed to left scapula and right hip flexor/quad in order to decrease fascial restrictions and pain level to allow pt to participate in ADL tasks. Other Exercises: LUE, sidelying, shoulder flexion, protraction; 5X        Home Living Family/patient expects to be discharged to:: Private residence Living Arrangements: Other relatives (Sister) Available Help at Discharge: Family Type of Home: House Home Access: Ramped entrance     Home Layout: One level     Bathroom Shower/Tub: Tub only   Firefighter: Handicapped height Bathroom Accessibility: Yes   Home Equipment: Rollator (4 wheels)   Additional Comments: rollator      Prior Functioning/Environment Prior Level of Function : Independent/Modified Independent;History of Falls (last six months)      Mobility Comments: doesn't drive; sister drives and lives with her (works half day) ADLs Comments: independent        OT Problem List: Impaired balance (sitting and/or standing);Decreased activity tolerance;Pain      OT Treatment/Interventions: Self-care/ADL training;Therapeutic exercise;Therapeutic activities;Neuromuscular education;Patient/family education;DME and/or AE instruction;Manual therapy;Balance training;Modalities    OT Goals(Current goals can be found in the care plan section) Acute Rehab OT Goals Patient Stated Goal: to decrease her pain OT Goal Formulation: Patient unable to participate in goal setting Time For Goal Achievement: 08/12/22 Potential to Achieve Goals: Good  OT Frequency: Min 2X/week       AM-PAC  OT "6 Clicks" Daily Activity     Outcome Measure Help from another person eating meals?: None Help from another person taking care of personal grooming?: A Little Help from another person toileting, which includes using toliet, bedpan, or urinal?: A Little Help from another person bathing (including washing, rinsing, drying)?: A Little Help from another person to put on and taking off regular upper body clothing?: A Little Help from another person to put on and taking off regular lower body clothing?: A Little 6 Click Score: 19   End of Session    Activity Tolerance: Patient limited by pain Patient left: in bed;with call bell/phone within reach;with bed alarm set;with family/visitor present  OT Visit Diagnosis: Pain;Muscle weakness (generalized) (M62.81) Pain - Right/Left:  (bilateral) Pain - part of body: Shoulder;Hip                Time: 8099-8338 OT Time Calculation (min): 28 min Charges:  OT General Charges $OT Visit: 1 Visit OT Evaluation $OT Eval Moderate Complexity: 1 Mod OT Treatments $Therapeutic Activity: 8-22 mins  Limmie Patricia, OTR/L,CBIS  Supplemental OT - [facility] and [facility] Secure Chat Preferred    Edyth Glomb, Charisse March 07/29/2022, 1:07 PM

## 2022-07-30 LAB — CBC
HCT: 25.3 % — ABNORMAL LOW (ref 36.0–46.0)
Hemoglobin: 8.1 g/dL — ABNORMAL LOW (ref 12.0–15.0)
MCH: 28.6 pg (ref 26.0–34.0)
MCHC: 32 g/dL (ref 30.0–36.0)
MCV: 89.4 fL (ref 80.0–100.0)
Platelets: 275 10*3/uL (ref 150–400)
RBC: 2.83 MIL/uL — ABNORMAL LOW (ref 3.87–5.11)
RDW: 15.7 % — ABNORMAL HIGH (ref 11.5–15.5)
WBC: 6.5 10*3/uL (ref 4.0–10.5)
nRBC: 0 % (ref 0.0–0.2)

## 2022-07-30 LAB — BASIC METABOLIC PANEL
Anion gap: 9 (ref 5–15)
BUN: 28 mg/dL — ABNORMAL HIGH (ref 8–23)
CO2: 24 mmol/L (ref 22–32)
Calcium: 9 mg/dL (ref 8.9–10.3)
Chloride: 103 mmol/L (ref 98–111)
Creatinine, Ser: 2.12 mg/dL — ABNORMAL HIGH (ref 0.44–1.00)
GFR, Estimated: 24 mL/min — ABNORMAL LOW (ref >60)
Glucose, Bld: 127 mg/dL — ABNORMAL HIGH (ref 70–99)
Potassium: 3.6 mmol/L (ref 3.5–5.1)
Sodium: 136 mmol/L (ref 135–145)

## 2022-07-30 MED ORDER — LIDOCAINE 5 % EX PTCH
1.0000 | MEDICATED_PATCH | CUTANEOUS | Status: DC
  Administered 2022-07-30: 1 via TRANSDERMAL
  Filled 2022-07-30: qty 1

## 2022-07-30 MED ORDER — LACTATED RINGERS IV BOLUS
1000.0000 mL | Freq: Once | INTRAVENOUS | Status: AC
  Administered 2022-07-30: 1000 mL via INTRAVENOUS

## 2022-07-30 NOTE — Progress Notes (Signed)
Hospital day#2 Subjective:    Overnight Events: No major events overnight  Patient was orthostatic while working with PT yesterday. Patient endorsing dizziness while standing today and while going to the bathroom. No overt bleeding and continues to drink and eat during meals.   R lateral hip pain relieved with Lidocaine patch. No other concerns today.    Objective:  Vital signs in last 24 hours: Vitals:   07/30/22 0842 07/30/22 1615 07/30/22 1617 07/30/22 1618  BP: 139/79 (!) 155/86 (!) 127/99 (!) 129/118  Pulse: 93 97 (!) 112 (!) 102  Resp:      Temp: 98.9 F (37.2 C)     TempSrc: Oral     SpO2: 94%     Weight:      Height:       Supplemental O2: Room Air SpO2: 94 % O2 Flow Rate (L/min): 2 L/min Filed Weights   07/28/22 0216 07/28/22 2205 07/28/22 2300  Weight: 79.8 kg 77.7 kg 78.4 kg    Physical Exam:  General: Pleasant, well-appearing woman laying in bed in no acute distress CV: RRR. No murmurs, rubs, or gallops. No LE edema Pulmonary:  Speaking in full sentences on room air. Improved air movement in the L lower lung base otherwise CTAB without wheezing on exam Abdominal: Soft, nontender, nondistended. Normal bowel sounds. Extremities: Palpable radial and DP pulses. R hip flexion, extension, internal and external rotation without pain. Mild tenderness to palpation on lateral hip Skin: Warm and dry. No obvious rash or lesions. Neuro: A&Ox3. Moves all extremities. Normal sensation. Gait not assessed. Psych: Normal mood and affect    Intake/Output Summary (Last 24 hours) at 07/30/2022 1649 Last data filed at 07/30/2022 1514 Gross per 24 hour  Intake 624.64 ml  Output --  Net 624.64 ml   Net IO Since Admission: 1,494.06 mL [07/30/22 1649]  Pertinent Labs:    Latest Ref Rng & Units 07/30/2022    2:37 AM 07/29/2022    9:19 AM 07/29/2022   12:04 AM  CBC  WBC 4.0 - 10.5 K/uL 6.5  7.3  7.9   Hemoglobin 12.0 - 15.0 g/dL 8.1  7.7  7.7   Hematocrit 36.0 - 46.0  % 25.3  23.1  23.8   Platelets 150 - 400 K/uL 275  259  252        Latest Ref Rng & Units 07/30/2022    2:37 AM 07/29/2022   12:04 AM 07/28/2022    3:30 AM  CMP  Glucose 70 - 99 mg/dL 762  95  263   BUN 8 - 23 mg/dL 28  28  34   Creatinine 0.44 - 1.00 mg/dL 3.35  4.56  2.56   Sodium 135 - 145 mmol/L 136  140  142   Potassium 3.5 - 5.1 mmol/L 3.6  3.5  3.3   Chloride 98 - 111 mmol/L 103  107  107   CO2 22 - 32 mmol/L 24  24  25    Calcium 8.9 - 10.3 mg/dL 9.0  9.0  8.8     Imaging: No results found.  Assessment/Plan:   Principal Problem:   Symptomatic anemia Active Problems:   Supratherapeutic INR   Subserous leiomyoma of uterus   AKI (acute kidney injury)   Stage 3b chronic kidney disease   Depressed mood   Patient Summary: Renee Payne is a 72 y.o. 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 Patient is currently being managed by CAP.  Symptomatic Anemia Exophytic, subserosal uterine fibroid Presented with recent worsening of dizziness, which has been present for ~1 year likely in settingof slow bleeding uterine fibroids. S/p vitamin K for wafarin reversal as patient was supra therapeutic and there was concern for active bleeding. Hgb on admission 5.5 s/p 2 units pRBC. Hgb has been stable with Hgb 8.1 this AM. OB/Gyn consulted; concerned about potential for Leiomyosarcoma. However, patient denied further cancer work up. OBGYN did not recommend hysterectomy at this at this time for fibroid removal and outpatient follow up at discharge.  Patient was planned to be discharged on 4/13 but given continued orthostatic hypotention s/p 1L of LR, will keep overnight given concerns for safety in setting on anticoagulation.  -Trend Hgb daily -No procedures scheduled at  this time per OBGYN; now have signed off -Will add compression stockings to help with orthostasis -Repeat orthostatic vitals tomorrow morning -Consider AM cortisol if patient with hypotension at rest, which is not happened yet during this hospitalization --Discuss transfusion vs midodrine if positive orthostatic VS --if Orthostatics negative, patient could be discharged tomorrow --and outpatient IR consultation for embolization if not amenable for surgery.   Community acquired pneumonia Patient started on CTX and azithromycin  on admission. Urinary strep antigen pending. Patient remains at RA with saturations >93%. Patient transitioned to Augmentin BID and continues on azithromycin -CTX started 4/11 - d/c'd  4/12 -Continue Azithromycin 500 mg to end 07/30/2022 -Augmentin 500-125 mg BID  started on 4/12  to end 08/01/2022 -Outpatient medications through [facility] have been ordered from Friday 4/12. Will need to alter instructions to make sure medication durations reflect later discharge date  AKI on CKDIIB  Baseline Cr. 1.9-2.0. Admission Cr 2.4. Patient with R hydronephrosis/hydroureter 2/2 uterine fibroid obstruction likely contributing to AKI. Today Cr. 2.12, slightly elevated from yesterday but improved from admission.  Will recommend outpatient urology follow up for stent placement if continued decline in renal function given enlarged exophytic fibroid -would consider urology follow up for ureteral stent  History of PE on chronic anticoagulation Hgb currently stable. Patient previously on chronic warfarin; unable to see why patient had not been transitioned to DOAC prior to this admission. It seems to have been carried over the years. Co-pay for apixaban with her insurance is $11. Patient was started on Eliquis on 07/29/2022. -ContinueEliquis 5 mg BID -Outpatient medications through [facility] have been ordered and saved since Friday 4/12. Will need to alter instructions to make sure medication durations  reflect later discharge date  HTN  History of CVA HLD Home Losartan 50 mg daily held in setting of normotension and now orthostatic hypotension. -Continue ASA 81 mg -Continue Crestor 20 mg -Continue  Eliquis 5 mg BID -Will restart Losartan 50 mg daily if persistently hypertensive/resolution of orthostatic hypotension  Depression Mood has improved this morning. Patient will benefit from outpatient psychiatry and psychotherapy. Initiated on pharmacologic treatment on admission. -CTM -continue Paroxetine 10 mg daily  T2DM  Has not required SSI  Diet: Heart Healthy VTE: NOAC Code: DNR PT/OT recs: pending  Dispo: Anticipated discharge to Home in 1 days pending clinical improvement.   Morene Crocker, MD Internal Medicine Resident PGY-1 Please contact the on call pager after 5 pm and on weekends at [phone].

## 2022-07-30 NOTE — Plan of Care (Signed)

## 2022-07-30 NOTE — Consult Note (Signed)
OBSTETRICS AND GYNECOLOGY ATTENDING CONSULT NOTE  Consult Date: 07/29/22 Reason for Consult: suspected degenerating fibroid causing symptomatic anemia  Consulting Provider: Dr. Delorise Shiner Lau/Dr. Quincy Simmonds     Assessment: Please see original consult note from 4/11 for original assessment. Today, it appears that patient's hemoglobin has remained stable since transfusion. It is reassuring that the patient is no longer bleeding now that her INR has improved to 1.9. The patient has been transitioned to Eliquis which may lower risk of hemorrhage within this fibroid.   Today, I again reviewed with the patient that this fibroid may actually be a leiomyosarcoma. We discussed that a tissue diagnosis would be needed to start cancer-directed therapies. While I would not recommend biopsy of the mass, we could consider an outpatient endometrial biopsy. The utility is limited as the patient is not having vaginal bleeding, so it may not actually yield useful pathology. If it did, however, she may be a candidate for less intensive therapies like letrozole.   She is still not sure that a work up for malignancy is aligned with her goals. She is amenable to continued discussion as an outpatient.   Plan: - Appears to have stabilized from a bleeding standpoint. Would not recommend procedural/surgical intervention if Hgb remains stable.  - Regardless of if this is a malignancy or benign degenerating fibroid, she may continue to have pain. Her pain is currently controlled with tylenol. - Gynecologic follow up can be completed as an outpatient. I have already included our information in her discharge paperwork. I offered to have my office contact her to arrange follow up, but she told me she would prefer to make her own appointment.  - We will sign off at this time, but please don't hesitate to call if there are any additional questions or concerns.   Please call [phone] [facility] OB/GYN Consult Attending  Monday-Friday 8am - 5pm) or [phone] [facility] OB[facility] Attending On Call all day, every day) with concerns.  Thank you for involving [place] in the care of this patient.  Total consultation time including face-to-face time with patient (>50% of time), reviewing chart and documentation: 30 minutes  Harvie Bridge, MD Obstetrician & Gynecologist, [facility] for [facility], [facility] Consult Phone: [phone]   Interval History: Renee Payne is an 72 y.o. with hx PE on wafarin, CKD, CVA, HTN, T2DM and recent pacemaker insertion admitted for symptomatic anemia from a hemorrhagic fibroid and community acquired PNA.   She reports that she is feeling better. Pain is controlled. No vaginal bleeding. Is mostly just frustrated with how no one (myself included) can give her definitive answers about her health care.  FOBT pending   Pertinent OB/GYN History: No LMP recorded. Patient is postmenopausal. 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   Leukopenia    Essential hypertension 11/13/2016   History of pulmonary embolism 11/13/2016   Anxiety 11/13/2016   Right arm weakness 11/13/2016   Suspected stroke patient last  known to be well more than 2 hours ago 11/13/2016   Diabetes mellitus type II, non insulin dependent 11/13/2016   OSA (obstructive sleep apnea) 11/13/2016   Suspected cerebrovascular accident (CVA) 11/13/2016    Past Medical History:  Diagnosis Date   Diabetes mellitus without  complication    Hyperlipidemia    Hypertension    Pulmonary emboli     Past Surgical History:  Procedure Laterality Date   IVC FILTER INSERTION     PACEMAKER IMPLANT N/A 06/20/2022   Procedure: PACEMAKER IMPLANT - DUAL CHAMBER;  Surgeon: Lanier Prude, MD;  Location: [facility] INVASIVE CV LAB;  Service: Cardiovascular;  Laterality: N/A;   TUBAL LIGATION      History reviewed. No pertinent family history.  Social History:  reports that she has never smoked. She has never used smokeless tobacco. She reports that she does not drink alcohol and does not use drugs.  Allergies:  Allergies  Allergen Reactions   Ativan [Lorazepam] Other (See Comments)    COMBATIVE AND EXTREMELY VIOLENT   Omeprazole Other (See Comments)    The front of her body felt very warm after one dose of this med.    Focused Physical Examination: BP 139/79 (BP Location: Right Arm)   Pulse 93   Temp 98.9 F (37.2 C) (Oral)   Resp 20   Ht  (1.549 m)   Wt 78.4 kg   SpO2 94%   BMI 32.66 kg/m  GENERAL: Alert and oriented x 3, sitting up at edge of hospital bed.Marland Kitchen CARDIOVASCULAR: Normal heart rate noted RESPIRATORY: Effort normal, no problems with respiration noted. ABDOMEN: Soft, obese, no distention noted. Mild lower abdominal tenderness with deep palpation but no rebound or guarding PELVIC: Deferred  Labs and Imaging: Results for orders placed or performed during the hospital encounter of 07/28/22 (from the past 72 hour(s))  Basic metabolic panel     Status: Abnormal   Collection Time: 07/28/22  3:30 AM  Result Value Ref Range   Sodium 142 135 - 145 mmol/L   Potassium 3.3 (L) 3.5 - 5.1 mmol/L   Chloride 107 98 - 111 mmol/L   CO2 25 22 - 32 mmol/L   Glucose, Bld 105 (H) 70 - 99 mg/dL    Comment: Glucose reference range applies only to samples taken after fasting for at least 8 hours.   BUN 34 (H) 8 - 23 mg/dL   Creatinine, Ser 7.82 (H) 0.44 - 1.00 mg/dL   Calcium 8.8 (L) 8.9 - 10.3 mg/dL   GFR,  Estimated 21 (L) >60 mL/min    Comment: (NOTE) Calculated using the CKD-EPI Creatinine Equation (2021)    Anion gap 10 5 - 15    Comment: Performed at [facility], [address], [place], [place] [address]  CBC WITH DIFFERENTIAL     Status: Abnormal   Collection Time: 07/28/22  3:30 AM  Result Value Ref Range   WBC 6.6 4.0 - 10.5 K/uL   RBC 1.90 (L) 3.87 - 5.11 MIL/uL   Hemoglobin 5.5 (LL) 12.0 - 15.0 g/dL    Comment: REPEATED TO VERIFY THIS CRITICAL RESULT HAS VERIFIED AND BEEN CALLED TO KIRK BRANCH RN BY SHERRY GALLOWAY ON 04 11 2024 AT 0402, AND HAS BEEN READ BACK.  THIS CRITICAL RESULT HAS VERIFIED AND BEEN CALLED TO KIRK BRANCH RN BY SHERRY GALLOWAY ON 04 11 2024 AT 0403, AND HAS BEEN READ BACK.     HCT 17.6 (L) 36.0 - 46.0 %  MCV 92.6 80.0 - 100.0 fL   MCH 28.9 26.0 - 34.0 pg   MCHC 31.3 30.0 - 36.0 g/dL   RDW 40.9 (H) 81.1 - 91.4 %   Platelets 253 150 - 400 K/uL   nRBC 0.0 0.0 - 0.2 %   Neutrophils Relative % 74 %   Neutro Abs 5.0 1.7 - 7.7 K/uL   Lymphocytes Relative 11 %   Lymphs Abs 0.7 0.7 - 4.0 K/uL   Monocytes Relative 13 %   Monocytes Absolute 0.8 0.1 - 1.0 K/uL   Eosinophils Relative 1 %   Eosinophils Absolute 0.0 0.0 - 0.5 K/uL   Basophils Relative 1 %   Basophils Absolute 0.0 0.0 - 0.1 K/uL   Immature Granulocytes 0 %   Abs Immature Granulocytes 0.02 0.00 - 0.07 K/uL    Comment: Performed at [facility], [address], [place], [place] [address]  ABO/Rh     Status: None   Collection Time: 07/28/22  3:30 AM  Result Value Ref Range   ABO/RH(D)      O POS Performed at [facility], [address] [place], [place] [address]   CBG monitoring, ED     Status: None   Collection Time: 07/28/22  3:33 AM  Result Value Ref Range   Glucose-Capillary 99 70 - 99 mg/dL    Comment: Glucose reference range applies only to samples taken after fasting for at least 8 hours.  Prepare RBC (crossmatch)     Status: None   Collection Time: 07/28/22   4:12 AM  Result Value Ref Range   Order Confirmation      ORDER PROCESSED BY BLOOD BANK Performed at [facility], [address] [place], [place] [address]   Protime-INR     Status: Abnormal   Collection Time: 07/28/22  4:20 AM  Result Value Ref Range   Prothrombin Time 51.5 (H) 11.4 - 15.2 seconds   INR 5.8 (HH) 0.8 - 1.2    Comment: REPEATED TO VERIFY CRITICAL RESULT CALLED TO, READ BACK BY AND VERIFIED WITH:  W. TYLER RN, [id number], 07/28/22, EADEDOKUN (NOTE) INR goal varies based on device and disease states. Performed at [facility], [address], [place], [place] [address]   Type and screen [facility]     Status: None   Collection Time: 07/28/22  4:20 AM  Result Value Ref Range   ABO/RH(D) O POS    Antibody Screen NEG    Sample Expiration 07/31/2022,2359    Unit Number [id number]    Blood Component Type RBC, LR IRR    Unit division 00    Status of Unit ISSUED,FINAL    Transfusion Status OK TO TRANSFUSE    Crossmatch Result      Compatible Performed at [facility], [address] [place], [place] [address]    Unit Number [id number]    Blood Component Type RBC LR PHER1    Unit division 00    Status of Unit ISSUED,FINAL    Transfusion Status OK TO TRANSFUSE    Crossmatch Result Compatible   SARS Coronavirus 2 by RT PCR (hospital order, performed in [facility] hospital lab) *cepheid single result test* Anterior Nasal Swab     Status: None   Collection Time: 07/28/22  8:14 AM   Specimen: Anterior Nasal Swab  Result Value Ref Range   SARS Coronavirus 2 by RT PCR NEGATIVE NEGATIVE    Comment: Performed at  [facility], [address] [place] [place], [place] [address]  Urinalysis, Routine w reflex microscopic -Urine, Clean Catch     Status: Abnormal   Collection Time: 07/28/22 10:32 AM  Result Value Ref Range   Color, Urine YELLOW YELLOW   APPearance CLOUDY (A) CLEAR   Specific Gravity, Urine 1.016 1.005 - 1.030   pH 5.0  5.0 - 8.0   Glucose, UA NEGATIVE NEGATIVE mg/dL   Hgb urine dipstick MODERATE (A) NEGATIVE   Bilirubin Urine NEGATIVE NEGATIVE   Ketones, ur NEGATIVE NEGATIVE mg/dL   Protein, ur 914 (A) NEGATIVE mg/dL   Nitrite NEGATIVE NEGATIVE   Leukocytes,Ua TRACE (A) NEGATIVE   RBC / HPF 0-5 0 - 5 RBC/hpf   WBC, UA 6-10 0 - 5 WBC/hpf   Bacteria, UA FEW (A) NONE SEEN   Squamous Epithelial / HPF 6-10 0 - 5 /HPF   Mucus PRESENT    Hyaline Casts, UA PRESENT    Amorphous Crystal PRESENT     Comment: Performed at [facility], [address] [place], [place] [address]  Strep pneumoniae urinary antigen     Status: None   Collection Time: 07/28/22 12:02 PM  Result Value Ref Range   Strep Pneumo Urinary Antigen NEGATIVE NEGATIVE    Comment:        Infection due to S. pneumoniae cannot be absolutely ruled out since the antigen present may be below the detection limit of the test. Performed at [facility], [address], [place], [place] [address]   Iron and TIBC     Status: Abnormal   Collection Time: 07/28/22  1:00 PM  Result Value Ref Range   Iron 23 (L) 28 - 170 ug/dL   TIBC 865 (L) 784 - 696 ug/dL   Saturation Ratios 15 10.4 - 31.8 %   UIBC 131 ug/dL    Comment: Performed at [facility], [address], [place], [place] [address]  Ferritin     Status: Abnormal   Collection Time: 07/28/22  1:00 PM  Result Value Ref Range   Ferritin 338 (H) 11 - 307 ng/mL    Comment: Performed at [facility], [address], [place], [place] [address]  CBC     Status: Abnormal   Collection Time: 07/29/22 12:04 AM  Result Value Ref Range   WBC 7.9 4.0 - 10.5 K/uL   RBC 2.72 (L) 3.87 - 5.11 MIL/uL   Hemoglobin 7.7 (L) 12.0 - 15.0 g/dL    Comment: REPEATED TO VERIFY POST TRANSFUSION SPECIMEN    HCT 23.8 (L) 36.0 - 46.0 %   MCV 87.5 80.0 - 100.0 fL   MCH 28.3 26.0 - 34.0 pg   MCHC 32.4 30.0 - 36.0 g/dL   RDW 40.1 (H) 02.7 - 25.3 %   Platelets 252 150 - 400 K/uL   nRBC 0.0  0.0 - 0.2 %    Comment: Performed at [facility], [address] [place], [place] [address]  Basic metabolic panel     Status: Abnormal   Collection Time: 07/29/22 12:04 AM  Result Value Ref Range   Sodium 140 135 - 145 mmol/L   Potassium 3.5 3.5 - 5.1 mmol/L   Chloride 107 98 - 111 mmol/L   CO2 24 22 - 32 mmol/L   Glucose, Bld 95 70 - 99 mg/dL    Comment: Glucose reference range applies only to samples taken after fasting for at least 8 hours.  BUN 28 (H) 8 - 23 mg/dL   Creatinine, Ser 1.61 (H) 0.44 - 1.00 mg/dL   Calcium 9.0 8.9 - 09.6 mg/dL   GFR, Estimated 25 (L) >60 mL/min    Comment: (NOTE) Calculated using the CKD-EPI Creatinine Equation (2021)    Anion gap 9 5 - 15    Comment: Performed at [facility], [address], [place], [place] [address]  Magnesium     Status: None   Collection Time: 07/29/22 12:04 AM  Result Value Ref Range   Magnesium 2.2 1.7 - 2.4 mg/dL    Comment: Performed at [facility], [address] [place], [place] [address]  Protime-INR     Status: Abnormal   Collection Time: 07/29/22 12:04 AM  Result Value Ref Range   Prothrombin Time 21.2 (H) 11.4 - 15.2 seconds   INR 1.9 (H) 0.8 - 1.2    Comment: (NOTE) INR goal varies based on device and disease states. Performed at [facility], [address], [place], [place] [address]   Glucose, capillary     Status: None   Collection Time: 07/29/22  8:21 AM  Result Value Ref Range   Glucose-Capillary 97 70 - 99 mg/dL    Comment: Glucose reference range applies only to samples taken after fasting for at least 8 hours.  CBC     Status: Abnormal   Collection Time: 07/29/22  9:19 AM  Result Value Ref Range   WBC 7.3 4.0 - 10.5 K/uL   RBC 2.66 (L) 3.87 - 5.11 MIL/uL   Hemoglobin 7.7 (L) 12.0 - 15.0 g/dL   HCT 95.6 (L) 21.3 - 08.6 %   MCV 86.8 80.0 - 100.0 fL   MCH 28.9 26.0 - 34.0 pg   MCHC 33.3 30.0 - 36.0 g/dL   RDW 57.8 (H) 46.9 - 62.9 %   Platelets 259 150 - 400 K/uL    nRBC 0.0 0.0 - 0.2 %    Comment: Performed at [facility], [address], [place], [place] [address]  CBC     Status: Abnormal   Collection Time: 07/30/22  2:37 AM  Result Value Ref Range   WBC 6.5 4.0 - 10.5 K/uL   RBC 2.83 (L) 3.87 - 5.11 MIL/uL   Hemoglobin 8.1 (L) 12.0 - 15.0 g/dL   HCT 32.4 (L) 40.1 - 02.7 %   MCV 89.4 80.0 - 100.0 fL   MCH 28.6 26.0 - 34.0 pg   MCHC 32.0 30.0 - 36.0 g/dL   RDW 25.3 (H) 66.4 - 40.3 %   Platelets 275 150 - 400 K/uL   nRBC 0.0 0.0 - 0.2 %    Comment: Performed at [facility], [address], [place], [place] [address]  Basic metabolic panel     Status: Abnormal   Collection Time: 07/30/22  2:37 AM  Result Value Ref Range   Sodium 136 135 - 145 mmol/L   Potassium 3.6 3.5 - 5.1 mmol/L   Chloride 103 98 - 111 mmol/L   CO2 24 22 - 32 mmol/L   Glucose, Bld 127 (H) 70 - 99 mg/dL    Comment: Glucose reference range applies only to samples taken after fasting for at least 8 hours.   BUN 28 (H) 8 - 23 mg/dL   Creatinine, Ser 9.56 (H) 0.44 - 1.00 mg/dL   Calcium 9.0 8.9 - 38.7 mg/dL   GFR, Estimated 24 (L) >60 mL/min    Comment: (NOTE)  Calculated using the CKD-EPI Creatinine Equation (2021)    Anion gap 9 5 - 15    Comment: Performed at [facility], [address] [place], [place] [address]    No results found.

## 2022-07-31 DIAGNOSIS — D252 Subserosal leiomyoma of uterus: Secondary | ICD-10-CM | POA: Diagnosis not present

## 2022-07-31 DIAGNOSIS — D649 Anemia, unspecified: Secondary | ICD-10-CM | POA: Diagnosis not present

## 2022-07-31 LAB — BASIC METABOLIC PANEL
Anion gap: 7 (ref 5–15)
BUN: 24 mg/dL — ABNORMAL HIGH (ref 8–23)
CO2: 23 mmol/L (ref 22–32)
Calcium: 8.7 mg/dL — ABNORMAL LOW (ref 8.9–10.3)
Chloride: 104 mmol/L (ref 98–111)
Creatinine, Ser: 1.86 mg/dL — ABNORMAL HIGH (ref 0.44–1.00)
GFR, Estimated: 28 mL/min — ABNORMAL LOW (ref >60)
Glucose, Bld: 96 mg/dL (ref 70–99)
Potassium: 3.6 mmol/L (ref 3.5–5.1)
Sodium: 134 mmol/L — ABNORMAL LOW (ref 135–145)

## 2022-07-31 LAB — CBC
HCT: 22.5 % — ABNORMAL LOW (ref 36.0–46.0)
Hemoglobin: 7.5 g/dL — ABNORMAL LOW (ref 12.0–15.0)
MCH: 29.2 pg (ref 26.0–34.0)
MCHC: 33.3 g/dL (ref 30.0–36.0)
MCV: 87.5 fL (ref 80.0–100.0)
Platelets: 265 10*3/uL (ref 150–400)
RBC: 2.57 MIL/uL — ABNORMAL LOW (ref 3.87–5.11)
RDW: 14.8 % (ref 11.5–15.5)
WBC: 5.2 10*3/uL (ref 4.0–10.5)
nRBC: 0 % (ref 0.0–0.2)

## 2022-07-31 NOTE — TOC Transition Note (Signed)
Transition of Care [facility]) - CM/SW Discharge Note   Patient Details  Name: Renee Payne MRN: [id number] Date of Birth: 18-Aug-1950  [facility] CM/SW Contact:  Lawerance Sabal, RN Phone Number: 07/31/2022, 3:05 PM   Clinical Narrative:     Patient currently active with [facility] services, notified liaison of DC today. No DME needs.   Meds filled by [facility] pharmacy Friday, and will need sent home with patient.   Final next level of care: Home w Home Health Services Barriers to Discharge: No Barriers Identified   Patient Goals and CMS Choice      Discharge Placement                         Discharge Plan and Services Additional resources added to the After Visit Summary for                              [facility] Agency: [facility] Date [facility] Agency Contacted: 07/31/22 Time HH Agency Contacted: 1505 Representative spoke with at [facility] Agency: Laurelyn Sickle  Social Determinants of Health (SDOH) Interventions SDOH Screenings   Food Insecurity: No Food Insecurity (07/28/2022)  Housing: Low Risk  (07/28/2022)  Transportation Needs: No Transportation Needs (07/28/2022)  Utilities: Not At Risk (07/28/2022)  Tobacco Use: Low Risk  (07/28/2022)     Readmission Risk Interventions     No data to display

## 2022-07-31 NOTE — Progress Notes (Signed)
Discharge instructions given with stated understanding 

## 2022-07-31 NOTE — Progress Notes (Signed)
  Error (see d/c summary same date)

## 2022-08-01 ENCOUNTER — Telehealth: Payer: Self-pay | Admitting: Cardiology

## 2022-08-01 NOTE — Telephone Encounter (Signed)
Pt called in to inform Dr. Lalla Brothers that she had the flu recently (She states she was told to call since she had her pacemaker put in)

## 2022-08-02 ENCOUNTER — Telehealth: Payer: Self-pay | Admitting: Internal Medicine

## 2022-08-02 NOTE — Telephone Encounter (Signed)
Patient's daughter, Andreas Blower, was here this afternoon.  States her mom was verbally given instructions by Dr. Sol Blazing  to have blood work to check her hemoglobin levels in 5-7 days.  Did not see any orders, discharge summary instructions or future appts please advise.  And give daughter a phone call.

## 2022-08-03 ENCOUNTER — Other Ambulatory Visit: Payer: Self-pay | Admitting: Internal Medicine

## 2022-08-03 DIAGNOSIS — N179 Acute kidney failure, unspecified: Secondary | ICD-10-CM

## 2022-08-03 DIAGNOSIS — D649 Anemia, unspecified: Secondary | ICD-10-CM

## 2022-08-03 NOTE — Telephone Encounter (Signed)
DC summary completed. Will see her in clinic tomorrow.

## 2022-08-03 NOTE — Telephone Encounter (Signed)
I have spoken with Dr. Mayford Knife who was attending over the weekend. Plan had been for RN visit for BP check and lab visit. Dr. Mayford Knife will be putting those orders in now and sending a message to front desk for scheduling. Dr. Burnice Logan has been notified to complete the discharge summary. Thank you.

## 2022-08-03 NOTE — Discharge Summary (Signed)
Name: Renee Payne MRN: [id number] DOB: [birth year] 72 y.o. PCP: [facility]  Date of Admission: 07/28/2022  2:11 AM Date of Discharge: 07/31/22 Attending Physician: Dr. Mayford Knife  Discharge Diagnosis: Principal Problem:   Symptomatic anemia Active Problems:   Supratherapeutic INR   Subserous leiomyoma of uterus   AKI (acute kidney injury)   Stage 3b chronic kidney disease   Depressed mood    Discharge Medications: Allergies as of 07/31/2022       Reactions   Ativan [lorazepam] Other (See Comments)   COMBATIVE AND EXTREMELY VIOLENT   Omeprazole Other (See Comments)   The front of her body felt very warm after one dose of this med.        Medication List     STOP taking these medications    hydrochlorothiazide 12.5 MG tablet Commonly known as: HYDRODIURIL   valsartan 160 MG tablet Commonly known as: DIOVAN   warfarin 5 MG tablet Commonly known as: COUMADIN       TAKE these medications    acetaminophen 325 MG tablet Commonly known as: TYLENOL Take 2 tablets (650 mg total) by mouth every 4 (four) hours as needed for headache or mild pain.   aspirin EC 81 MG tablet Take 1 tablet (81 mg total) by mouth daily.   Colchicine 0.6 MG Caps Take 0.6-1.2 mg by mouth See admin instructions. Take 1.2 mg once in the morning then 0.6 mg that night on the first day. Then take 0.6 mg once daily as needed for a gout flare up.   cyanocobalamin 1000 MCG tablet Commonly known as: VITAMIN B12 Take 1 tablet (1,000 mcg total) by mouth daily.   Eliquis 5 MG Tabs tablet Generic drug: apixaban Take 1 tablet (5 mg total) by mouth 2 (two) times daily.   EYE LUBRICANT OP Place 2 drops into both eyes as needed (dry eye).   losartan 50 MG tablet Commonly known as: COZAAR Take 50 mg by mouth daily.   metFORMIN 1000 MG tablet Commonly known as: GLUCOPHAGE Take 1,000 mg by mouth daily.   PARoxetine 10 MG tablet Commonly known as: PAXIL Take 1 tablet (10  mg total) by mouth daily.   rosuvastatin 20 MG tablet Commonly known as: CRESTOR Take 20 mg by mouth at bedtime.       ASK your doctor about these medications    amoxicillin-clavulanate 500-125 MG tablet Commonly known as: AUGMENTIN Take 1 tablet by mouth 2 (two) times daily for 5 doses. Ask about: Should I take this medication?        Disposition and follow-up:   Ms.Renee Payne was discharged from [facility] in Stable condition.  At the hospital follow up visit please address:  1.  Follow-up:  a. symptomatic anemia - s/p transfusion 2 units. started on eliquis    b. AKI on CKD - hydronephrosis obstruction from fibroid   c. Hx PE on AC - Eliquis.    d. DM    E. Fibroids vs leiomyosarcoma -  OBGYN evaluated her, need f/u. If bleeding consider embolization   F. Orthostatic - compression stocking   G. CAP - augmentin to finish 4/15  2.  Labs / imaging needed at time of follow-up: BMP, CBC  3.  Pending labs/ test needing follow-up: none  4.  Medication Changes  Started: eliquis  Stopped: warfarin  Changed:  Abx -  augmentin End Date: 4/15  Follow-up Appointments:  Follow-up Information     [place]  [facility] [employer] [facility] Follow up.   Specialty: Obstetrics and Gynecology Why: Alternative location to [facility] - may schedule here if location is more convenient. Contact information: [address] [place] [address] [phone]        [facility] Follow up.   Specialty: Obstetrics and Gynecology Why: You may follow up with our gynecologists at [facility] to monitor this fibroid. Please call to schedule an appointment as needed. Contact information: [address] [place] [address] [phone]        [facility] [place]. Go on 08/16/2022.   Specialty: Internal  Medicine Contact information: [address] [place] [address] [phone]         [facility] Follow up.   Specialty: Home Health Services Contact information: [address] [place] [address] [phone]                 Hospital Course by problem list:  Symptomatic anemia - patient presented with dizziness. Found to have worsened anemia, likely secondary to slow bleed from fibroid on warfarin. She received Vit K for INR reversal and was transfused 2 units. This improved hgb. OB evaluated her with concern for leimyosarcoma but pt declined further workup so OBGYN signed off. Patient was persistently orthostatic. She received IV fluids with improvement in her BP. Compression stocking recommended.   CAP - treated initially with CTX and azithro. Later transitioned to augmentin to complete course 4/15. Remained stable on RA.   AKI on CKDIIB  Baseline Cr. 1.9-2.0. Admission Cr 2.4. Patient with R hydronephrosis/hydroureter 2/2 uterine fibroid obstruction likely contributing to AKI. Improved with fluids.   Hx PE - Warfarin revered with Vit K and she was switched to eliquis. Discharge with Rx from TOC.   Discharge Subjective: No complaints from patient during initial evaluation. Later informed by nurse that she was complaining of leg pain. Patient reported mild pain in right thigh relieved by lidocaine patch.   Discharge Exam:   BP 134/79 (BP Location: Left Arm)   Pulse 81   Temp 98.6 F (37 C) (Oral)   Resp 17   Ht  (1.549 m)   Wt 78.4 kg   SpO2 97%   BMI 32.66 kg/m  General: Pleasant, well-appearing woman laying in bed in no acute distress CV: RRR. No murmurs, rubs, or gallops. No LE edema Pulmonary:  Speaking in full sentences on room air. Improved air movement in the L lower lung base otherwise CTAB without wheezing on exam Abdominal: Soft, nontender, nondistended. Normal bowel sounds. Extremities: Palpable radial and  DP pulses. R hip flexion, extension, internal and external rotation without pain. Mild tenderness to palpation on lateral thigh. Bony prominence without tenderness.  Skin: Warm and dry. No obvious rash or lesions. Neuro: A&Ox3. Moves all extremities. Normal sensation. Gait not assessed. Psych: Normal mood and affect  Pertinent Labs, Studies, and Procedures:     Latest Ref Rng & Units 07/31/2022    4:24 AM 07/30/2022    2:37 AM 07/29/2022    9:19 AM  CBC  WBC 4.0 - 10.5 K/uL 5.2  6.5  7.3   Hemoglobin 12.0 - 15.0 g/dL 7.5  8.1  7.7   Hematocrit 36.0 - 46.0 % 22.5  25.3  23.1   Platelets 150 - 400 K/uL 265  275  259  Latest Ref Rng & Units 07/31/2022    4:24 AM 07/30/2022    2:37 AM 07/29/2022   12:04 AM  CMP  Glucose 70 - 99 mg/dL 96  161  95   BUN 8 - 23 mg/dL Creatinine 0.44 - 1.00 mg/dL 0.96  0.45  4.09   Sodium 135 - 145 mmol/L 134  136  140   Potassium 3.5 - 5.1 mmol/L 3.6  3.6  3.5   Chloride 98 - 111 mmol/L 104  103  107   CO2 22 - 32 mmol/L Calcium 8.9 - 10.3 mg/dL 8.7  9.0  9.0     DG CHEST PORT 1 VIEW  Result Date: 07/28/2022 CLINICAL DATA:  Provided history: Cough. EXAM: PORTABLE CHEST 1 VIEW COMPARISON:  Prior chest radiographs 06/21/2022 and earlier. FINDINGS: Left chest dual lead implantable cardiac pacemaker. Borderline cardiomegaly. Ill-defined opacities within the medial left lung base. No appreciable airspace consolidation on the right. No evidence of pleural effusion or pneumothorax. No acute osseous abnormality identified. Degenerative changes of the spine. IMPRESSION: 1. Ill-defined opacities within the left lung base, which may reflect atelectasis or pneumonia. 2. Borderline cardiomegaly. Electronically Signed   By: Jackey Loge D.O.   On: 07/28/2022 10:30   CT ABDOMEN PELVIS WO CONTRAST  Result Date: 07/28/2022 CLINICAL DATA:  Abdominal pain.  Dizziness. EXAM: CT ABDOMEN AND PELVIS WITHOUT CONTRAST TECHNIQUE: Multidetector CT  imaging of the abdomen and pelvis was performed following the standard protocol without IV contrast. RADIATION DOSE REDUCTION: This exam was performed according to the departmental dose-optimization program which includes automated exposure control, adjustment of the mA and/or kV according to patient size and/or use of iterative reconstruction technique. COMPARISON:  MRI abdomen and pelvis 02/16/2022 FINDINGS: Lower chest: No acute abnormality. Hepatobiliary: No focal liver abnormality. Stone within the gallbladder fundus measures 1.5 cm. No gallbladder wall thickening or inflammation. No significant bile duct dilatation Pancreas: Unremarkable. No pancreatic ductal dilatation or surrounding inflammatory changes. Spleen: Normal in size without focal abnormality. Adrenals/Urinary Tract: Normal adrenal glands. Bilateral kidney cysts are again noted. Largest arises off the inferior pole of the right kidney measured 4.4 cm. No follow-up imaging recommended. Mild right hydronephrosis and proximal hydroureter. No urinary tract calculi. Bladder appears normal for the degree of distension. Stomach/Bowel: Stomach appears normal. The appendix is visualized and appears normal. No bowel wall thickening, inflammation or distension. Vascular/Lymphatic: Aortic atherosclerosis. No abdominopelvic adenopathy. Reproductive: Again seen is a large, dominant exophytic fibroid arising off the uterine fundus which is slightly eccentric right of the midline. This measures 12.7 x 10.8 by 11.7 cm (volume = 840 cm^3). On the previous MRI this measured 8.7 by 5.9 by 7.6 cm (volume = 200 cm^3). There is mixed attenuation within this mass with areas of increased density concerning for hemorrhage into a large fibroid. Other: There is no free fluid or fluid collections identified within the abdomen or pelvis. No signs of pneumoperitoneum. Musculoskeletal: No acute or significant osseous findings. IMPRESSION: 1. Interval enlargement large, dominant  subserosal fibroid within the uterine fundus. Heterogeneous increased in density within this mass is blood products findings are favored to represent hemorrhagic degeneration of the uterine fibroid. CTA of the abdomen and pelvis may be helpful to assess for active bleeding. 2. Mild right hydronephrosis and proximal hydroureter. This is favored to reflect mass effect upon the right ureter from enlarged fibroid. 3. Gallstone without evidence for acute cholecystitis. 4. Aortic  Atherosclerosis (ICD10-I70.0). Electronically Signed   By: Signa Kell M.D.   On: 07/28/2022 06:36   CT Head Wo Contrast  Result Date: 07/28/2022 CLINICAL DATA:  Head trauma EXAM: CT HEAD WITHOUT CONTRAST TECHNIQUE: Contiguous axial images were obtained from the base of the skull through the vertex without intravenous contrast. RADIATION DOSE REDUCTION: This exam was performed according to the departmental dose-optimization program which includes automated exposure control, adjustment of the mA and/or kV according to patient size and/or use of iterative reconstruction technique. 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 Mild chronic small vessel ischemia and cerebral volume loss. No evidence of acute infarct, hemorrhage, hydrocephalus, or mass. Vascular: No hyperdense vessel or unexpected calcification. Skull: Normal. Negative for fracture or focal lesion. Sinuses/Orbits: No acute finding. IMPRESSION: No acute or interval finding. Electronically Signed   By: Tiburcio Pea M.D.   On: 07/28/2022 06:05     Discharge Instructions: Discharge Instructions     Call MD for:  difficulty breathing, headache or visual disturbances   Complete by: As directed    Call MD for:  extreme fatigue   Complete by: As directed    Call MD for:  hives   Complete by: As directed    Call MD for:  persistant dizziness or  light-headedness   Complete by: As directed    Call MD for:  persistant nausea and vomiting   Complete by: As directed    Call MD for:  redness, tenderness, or signs of infection (pain, swelling, redness, odor or green/yellow discharge around incision site)   Complete by: As directed    Call MD for:  severe uncontrolled pain   Complete by: As directed    Call MD for:  temperature >100.4   Complete by: As directed    Diet - low sodium heart healthy   Complete by: As directed    Increase activity slowly   Complete by: As directed        Signed: Adron Bene, MD 08/03/2022, 6:52 PM   Pager: [phone]

## 2022-08-03 NOTE — Telephone Encounter (Signed)
Dr. Sol Blazing,   Please see note below. Hospital AVS states to make appt with her PCP. If you would like her to have a HFU with [place], or if you want her to have one time lab only visit, please put in Future orders. Thank you..  Of note, patient was d/c on 07/31/22 and there is no Discharge Summary.

## 2022-08-03 NOTE — Telephone Encounter (Signed)
Patient has been scheduled for [facility] tomorrow at 0845 with Dr. Saverio Danker. Patient's daughter aware.Marland Kitchen

## 2022-08-04 ENCOUNTER — Ambulatory Visit: Payer: Medicare HMO | Admitting: Internal Medicine

## 2022-08-04 NOTE — Progress Notes (Deleted)
   CC: ***  HPI:Ms.Renee Payne is a 72 y.o. female who presents for evaluation of ***. Please see individual problem based A/P for details.  Hx fibroids (concern for leiomyosarcoma) with vaginal bleeding, hx PE on Eliquis now, DM,   Fibroid - Degenerating postmenopausal concern leiomyosarcoma, no other malignancy indicators - Declined surgical/procedural interventions inpatient. - doesn't really seem to have tumor markers to check.  - obgyn follow up 5/2  Hx PE - eliquis - cbc   Anemia - normocytic - iron deficient, probable CKD component - slow bleed - oral iron  CKD  At baseline? - bmp  Orthostatic Does have chronic dizziness, possibly due to anemia - feeling today? - repeat orthostatics?  DM - A1c 6.3  CHB Pacemaker Follows with cardiology  Depression, PHQ-9: Based on the patients  score we have ***.  Past Medical History:  Diagnosis Date   Diabetes mellitus without complication    Hyperlipidemia    Hypertension    Pulmonary emboli    Review of Systems:   ROS   Physical Exam: There were no vitals filed for this visit.   General: *** HEENT: Conjunctiva nl , antiicteric sclerae, moist mucous membranes, no exudate or erythema Cardiovascular: Normal rate, regular rhythm.  No murmurs, rubs, or gallops Pulmonary : Equal breath sounds, No wheezes, rales, or rhonchi Abdominal: soft, nontender,  bowel sounds present Ext: No edema in lower extremities, no tenderness to palpation of lower extremities.   Assessment & Plan:   See Encounters Tab for problem based charting.  Patient {GC/GE:[id number]::"discussed with","seen with"} Dr. {NWGNF:[id number]::"V. Hoffman","Guilloud","Mullen","Narendra","Raines","Vincent","Williams"}

## 2022-08-05 NOTE — Telephone Encounter (Signed)
Called Renee Payne - no answer - then daughter Renee Payne to f/u on missed hospital f/u appt yesterday morning with Dr. Burnice Logan.  Renee Payne explained that she understood from the nurse/staff that this appt was to establish care, which she did not desire; they skipped the appt and went out to have breakfast instead!  Renee Payne is feeling well with no positional dizziness, unsteadiness, or shaking.  I explained that the visit was a one time only to ensure BP and HG (and sxs) were remaining stable and that we intended Renee Payne to return to PCP thereafter.  She agrees to bring Renee Payne in for lab and nurse bp visit next week.  I'll message front desk.  Lab order is in.

## 2022-08-08 NOTE — Telephone Encounter (Signed)
Just spoke with patient's daughter, Andreas Blower.  Agreed to an appointment for tomorrow 08/09/2022 at 10:30 am for blood pressure check and labs only per Dr. Mayford Knife.

## 2022-08-09 ENCOUNTER — Other Ambulatory Visit: Payer: Medicare HMO

## 2022-08-09 ENCOUNTER — Ambulatory Visit (INDEPENDENT_AMBULATORY_CARE_PROVIDER_SITE_OTHER): Payer: Medicare HMO | Admitting: *Deleted

## 2022-08-09 VITALS — BP 132/70 | HR 80

## 2022-08-09 DIAGNOSIS — I10 Essential (primary) hypertension: Secondary | ICD-10-CM

## 2022-08-09 DIAGNOSIS — N179 Acute kidney failure, unspecified: Secondary | ICD-10-CM

## 2022-08-09 DIAGNOSIS — D649 Anemia, unspecified: Secondary | ICD-10-CM

## 2022-08-09 NOTE — Progress Notes (Signed)
    Renee Payne presented today for blood pressure check. Patient is prescribed blood pressure medications and I confirmed that patient did not take their blood pressure medication prior to today's appointment. States she usually takes meds between 1100-1200 daily. Blood pressure was taken in the usual and appropriate manner using an automated BP cuff.     BP Readings from Last 3 Encounters:  08/09/22 132/70  07/31/22 134/79  06/21/22 (!) 158/94      Results of today's visit will be routed to Dr. Mayford Knife for review and further management.

## 2022-08-10 LAB — BMP8+ANION GAP
Anion Gap: 14 mmol/L (ref 10.0–18.0)
BUN/Creatinine Ratio: 15 (ref 12–28)
BUN: 25 mg/dL (ref 8–27)
CO2: 20 mmol/L (ref 20–29)
Calcium: 9.9 mg/dL (ref 8.7–10.3)
Chloride: 109 mmol/L — ABNORMAL HIGH (ref 96–106)
Creatinine, Ser: 1.62 mg/dL — ABNORMAL HIGH (ref 0.57–1.00)
Glucose: 90 mg/dL (ref 70–99)
Potassium: 4.9 mmol/L (ref 3.5–5.2)
Sodium: 143 mmol/L (ref 134–144)
eGFR: 33 mL/min/{1.73_m2} — ABNORMAL LOW (ref >59)

## 2022-08-10 LAB — CBC
Hematocrit: 25.9 % — ABNORMAL LOW (ref 34.0–46.6)
Hemoglobin: 8.6 g/dL — ABNORMAL LOW (ref 11.1–15.9)
MCH: 28.7 pg (ref 26.6–33.0)
MCHC: 33.2 g/dL (ref 31.5–35.7)
MCV: 86 fL (ref 79–97)
Platelets: 425 10*3/uL (ref 150–450)
RBC: 3 x10E6/uL — ABNORMAL LOW (ref 3.77–5.28)
RDW: 14.8 % (ref 11.7–15.4)
WBC: 5.2 10*3/uL (ref 3.4–10.8)

## 2022-08-12 NOTE — Progress Notes (Signed)
Nurse visit for BP check and labs all show improvement - Hg, BP, and kidney function.  She is currently taking losartan 50 mg daily.  Asymptomatic.  She has an appt scheduled with her own doctor on 08/15/22.  She is released from f/u in our [facility].  Spoke to her daughter Rodney Booze by phone this am; she expressed gratitude.

## 2022-08-18 ENCOUNTER — Encounter: Payer: Medicare HMO | Admitting: Obstetrics & Gynecology

## 2022-08-30 ENCOUNTER — Telehealth: Payer: Self-pay | Admitting: Cardiology

## 2022-08-30 NOTE — Telephone Encounter (Signed)
Patient stated that she put her left hand across her chest and her pacemaker popped up. Patient stated that it felt like her pacemaker halfway flipped up. Patient stated that this happened two days ago. Please advise.  Patient is having to go to the hospital for a MRI and was wanting to know what the effects are if she has a MRI with a pacemaker.    Pt c/o swelling: STAT is pt has developed SOB within 24 hours  How much weight have you gained and in what time span? No  If swelling, where is the swelling located? Both feet  Are you currently taking a fluid pill? No  Are you currently SOB? No  Do you have a log of your daily weights (if so, list)? No  Have you gained 3 pounds in a day or 5 pounds in a week? Unsure  Have you traveled recently? No   Patient has appt at 3 PM.

## 2022-08-30 NOTE — Telephone Encounter (Signed)
Patient is returning call. Requesting return call.  

## 2022-08-30 NOTE — Telephone Encounter (Signed)
Patient is not available at this time but family will have pt call.

## 2022-08-31 NOTE — Telephone Encounter (Signed)
Pt returning nurse call

## 2022-08-31 NOTE — Telephone Encounter (Signed)
Patient says it did "flip up" halfway and she pushed it back down.  No pain or discomfort, no signs of infection.  No other concerns.  Had patient send a manual transmission to review device function.  Device function is normal.  Patient is aware not to mess with device area and protect it while sleeping.  She is to call us back if the device continues to have flipping movements.

## 2022-08-31 NOTE — Telephone Encounter (Addendum)
Transmission review due to device "flipping". (See below for details)  Normal device function; however, noted the following on transmission:  PRESENTING is tachycardic:  AS/VP  107BPM, SOB ongoing with exertion.   Frequent Short bursts of AT, longest 1 min 50 secs. 5/11-5/15.   2.    1 Short  Aflutter and 1 short AF-  longest lasting 3 minutes and 38 seconds.  HX of CVA, no prior AF/flutter dx.  Overall prior ventricular rate control has been okay.   Patient is taking OAC twice daily (Eliquis).   Patient does report increase feelings of SOB on exertion in recent days and some fluid retention in lower legs.   (Saw her Nephrologist yesterday for fluid retention, they have referred her to PCP). PCP and gen card team located at [facility].   She sees Dr. Lalla Brothers on 09/23/22 to follow up on device.       Presenting:        AFIB/FLUTTER:

## 2022-08-31 NOTE — Telephone Encounter (Signed)
LM returning patient's call. LMOVM.

## 2022-08-31 NOTE — Telephone Encounter (Signed)
Dr. Nelly Laurence reviewed.   Continue to monitor and see Dr. Lalla Brothers in June. Nothing further at this point, patient to call if any concerns develop.

## 2022-09-01 NOTE — Telephone Encounter (Signed)
Attempted to contact patient to make DC apt. Will need industry present.   No answer, LMTCB.

## 2022-09-02 ENCOUNTER — Ambulatory Visit: Payer: Medicare HMO | Admitting: Obstetrics and Gynecology

## 2022-09-02 ENCOUNTER — Encounter: Payer: Self-pay | Admitting: Obstetrics and Gynecology

## 2022-09-02 VITALS — BP 105/64 | HR 92 | Ht 61.0 in | Wt 167.0 lb

## 2022-09-02 DIAGNOSIS — D219 Benign neoplasm of connective and other soft tissue, unspecified: Secondary | ICD-10-CM | POA: Diagnosis not present

## 2022-09-02 NOTE — Progress Notes (Unsigned)
    GYNECOLOGY VISIT  Patient name: Renee Payne MRN [id number]  Date of birth: August 10, 1950 Chief Complaint:   No chief complaint on file.   History:  Renee Payne is a 72 y.o. G3P3003 being seen today for ***.  Denies vaginal bleeding and states she doesn't have pain at this time. Not interested in getting any type of surgery/getting cut/going under anesthesia.   Daughter states has had dizziness for about a year and that she prevoiusly had abdominal pain.   Past Medical History:  Diagnosis Date   Diabetes mellitus without complication (HCC)    Hyperlipidemia    Hypertension    Pulmonary emboli (HCC)     Past Surgical History:  Procedure Laterality Date   IVC FILTER INSERTION     PACEMAKER IMPLANT N/A 06/20/2022   Procedure: PACEMAKER IMPLANT - DUAL CHAMBER;  Surgeon: Lanier Prude, MD;  Location: [facility] INVASIVE CV LAB;  Service: Cardiovascular;  Laterality: N/A;   TUBAL LIGATION      The following portions of the patient's history were reviewed and updated as appropriate: allergies, current medications, past family history, past medical history, past social history, past surgical history and problem list.   Health Maintenance:   Last pap ***. Results were: {Pap findings:25134}. H/O abnormal pap: {yes/yes***/no:23866} Last mammogram: ***. Results were: {normal, abnormal, n/a:23837}. Family h/o breast cancer: {yes***/no:23838}   Review of Systems:  {Ros - complete:30496} Comprehensive review of systems was otherwise negative.   Objective:  Physical Exam BP 105/64   Pulse 92   Ht 5\' 1"  (1.549 m)   Wt 167 lb (75.8 kg)   BMI 31.55 kg/m    Physical Exam   Labs and Imaging No results found.     Assessment & Plan:   There are no diagnoses linked to this encounter.   *** Routine preventative health maintenance measures emphasized.  Lorriane Shire, MD Minimally Invasive Gynecologic Surgery [facility], [facility]

## 2022-09-22 NOTE — Progress Notes (Signed)
  Electrophysiology Office Follow up Visit Note:    Date:  09/23/2022   ID:  Renee Payne, DOB January 12, 1951, MRN [id number]  PCP:  [facility], [facility] Cardiologist:  None  [facility] Electrophysiologist:  Lanier Prude, MD    Interval History:    Renee Payne is a 72 y.o. female who presents for a follow up visit.   She had a PPM implanted 06/20/2022 for symptomatic CHB.  Brief AHR episodes. She takes Eliquis.  She has been doing well.    Past medical, surgical, social and family history were reviewed.  ROS:   Please see the history of present illness.    All other systems reviewed and are negative.  EKGs/Labs/Other Studies Reviewed:    The following studies were reviewed today:  09/23/2022 in clinic device interrogation personally reviewed Battery longevity 9 years Lead parameter stable Underlying rhythm V sensing 42 V pacing Her histogram shows that her average atrial rate is between 80 and 100 bpm   Today's EKG shows an atrial sensed, ventricular paced rhythm.  Atrial rate 102 bpm   Physical Exam:    VS:  BP 114/66   Pulse (!) 102   Ht 5\' 1"  (1.549 m)   Wt 163 lb (73.9 kg)   SpO2 99%   BMI 30.80 kg/m     Wt Readings from Last 3 Encounters:  09/23/22 163 lb (73.9 kg)  09/02/22 167 lb (75.8 kg)  07/28/22 172 lb 13.5 oz (78.4 kg)     GEN:  Well nourished, well developed in no acute distress CARDIAC: RRR, no murmurs, rubs, gallops. PPM pocket well healed. RESPIRATORY:  Clear to auscultation without rales, wheezing or rhonchi       ASSESSMENT:    1. CHB (complete heart block) (HCC)   2. Cardiac pacemaker in situ   3. Atrial fibrillation, unspecified type (HCC)    PLAN:    In order of problems listed above:  #CHB #PPM in situ Device functioning appropriately, continue remote monitoring.  #AF Low burden. On OAC.   Follow up 1 year with APP     Signed, Steffanie Dunn, MD, [place], [place] 09/23/2022 4:54 PM     Electrophysiology [facility]

## 2022-09-23 ENCOUNTER — Ambulatory Visit: Payer: Medicare HMO | Attending: Cardiology | Admitting: Cardiology

## 2022-09-23 ENCOUNTER — Encounter: Payer: Self-pay | Admitting: Cardiology

## 2022-09-23 VITALS — BP 114/66 | HR 102 | Ht 61.0 in | Wt 163.0 lb

## 2022-09-23 DIAGNOSIS — I442 Atrioventricular block, complete: Secondary | ICD-10-CM | POA: Diagnosis not present

## 2022-09-23 DIAGNOSIS — I4891 Unspecified atrial fibrillation: Secondary | ICD-10-CM | POA: Diagnosis not present

## 2022-09-23 DIAGNOSIS — Z950 Presence of cardiac pacemaker: Secondary | ICD-10-CM

## 2022-09-23 LAB — CUP PACEART INCLINIC DEVICE CHECK
Battery Remaining Longevity: 108 mo
Battery Voltage: 2.99 V
Brady Statistic RA Percent Paced: 3.1 %
Brady Statistic RV Percent Paced: 99.9 %
Date Time Interrogation Session: 20240607163802
Implantable Lead Connection Status: 753985
Implantable Lead Connection Status: 753985
Implantable Lead Implant Date: 20240304
Implantable Lead Implant Date: 20240304
Implantable Lead Location: 753859
Implantable Lead Location: 753860
Implantable Pulse Generator Implant Date: 20240304
Lead Channel Impedance Value: 400 Ohm
Lead Channel Impedance Value: 512.5 Ohm
Lead Channel Pacing Threshold Amplitude: 0.5 V
Lead Channel Pacing Threshold Amplitude: 0.5 V
Lead Channel Pacing Threshold Amplitude: 1.5 V
Lead Channel Pacing Threshold Amplitude: 1.5 V
Lead Channel Pacing Threshold Pulse Width: 0.5 ms
Lead Channel Pacing Threshold Pulse Width: 0.5 ms
Lead Channel Pacing Threshold Pulse Width: 0.5 ms
Lead Channel Pacing Threshold Pulse Width: 0.5 ms
Lead Channel Sensing Intrinsic Amplitude: 12 mV
Lead Channel Sensing Intrinsic Amplitude: 5 mV
Lead Channel Setting Pacing Amplitude: 1.75 V
Lead Channel Setting Pacing Amplitude: 2 V
Lead Channel Setting Pacing Pulse Width: 0.5 ms
Lead Channel Setting Sensing Sensitivity: 4 mV
Pulse Gen Model: 2272
Pulse Gen Serial Number: 8164109

## 2022-09-23 NOTE — Patient Instructions (Signed)
Medication Instructions:  Your physician recommends that you continue on your current medications as directed. Please refer to the Current Medication list given to you today.  *If you need a refill on your cardiac medications before your next appointment, please call your pharmacy*  Follow-Up: At [facility], you and your health needs are our priority.  As part of our continuing mission to provide you with exceptional heart care, we have created designated Provider Care Teams.  These Care Teams include your primary Cardiologist (physician) and Advanced Practice Providers (APPs -  Physician Assistants and Nurse Practitioners) who all work together to provide you with the care you need, when you need it.  Your next appointment:   1 year(s)  Provider:   You may see CAMERON T LAMBERT, MD or one of the following Advanced Practice Providers on your designated Care Team:   Renee Ursuy, PA-C Michael "Andy" Tillery, PA-C Suzann Riddle, NP     

## 2022-10-10 ENCOUNTER — Ambulatory Visit: Payer: Medicare HMO

## 2022-10-10 DIAGNOSIS — I442 Atrioventricular block, complete: Secondary | ICD-10-CM | POA: Diagnosis not present

## 2022-10-11 LAB — CUP PACEART REMOTE DEVICE CHECK
Battery Remaining Longevity: 107 mo
Battery Remaining Percentage: 95.5 %
Battery Voltage: 2.99 V
Brady Statistic AP VP Percent: 2.9 %
Brady Statistic AP VS Percent: 1 %
Brady Statistic AS VP Percent: 97 %
Brady Statistic AS VS Percent: 1 %
Brady Statistic RA Percent Paced: 2.7 %
Brady Statistic RV Percent Paced: 99 %
Date Time Interrogation Session: 20240624020012
Implantable Lead Connection Status: 753985
Implantable Lead Connection Status: 753985
Implantable Lead Implant Date: 20240304
Implantable Lead Implant Date: 20240304
Implantable Lead Location: 753859
Implantable Lead Location: 753860
Implantable Pulse Generator Implant Date: 20240304
Lead Channel Impedance Value: 400 Ohm
Lead Channel Impedance Value: 450 Ohm
Lead Channel Pacing Threshold Amplitude: 0.5 V
Lead Channel Pacing Threshold Amplitude: 1.25 V
Lead Channel Pacing Threshold Pulse Width: 0.5 ms
Lead Channel Pacing Threshold Pulse Width: 0.5 ms
Lead Channel Sensing Intrinsic Amplitude: 12 mV
Lead Channel Sensing Intrinsic Amplitude: 4.7 mV
Lead Channel Setting Pacing Amplitude: 1.5 V
Lead Channel Setting Pacing Amplitude: 2 V
Lead Channel Setting Pacing Pulse Width: 0.5 ms
Lead Channel Setting Sensing Sensitivity: 4 mV
Pulse Gen Model: 2272
Pulse Gen Serial Number: 8164109

## 2022-10-28 NOTE — Progress Notes (Signed)
Remote pacemaker transmission.   

## 2022-11-01 ENCOUNTER — Telehealth: Payer: Self-pay

## 2022-11-01 NOTE — Telephone Encounter (Signed)
Left voicemail for patient's daughter to call back and schedule her 4 month follow up

## 2022-11-16 ENCOUNTER — Telehealth: Payer: Self-pay

## 2022-11-16 NOTE — Telephone Encounter (Signed)
-----   Message from Madlyn Frankel sent at 11/16/2022 12:58 PM EDT ----- Regarding: Surgery Patient came into the office and said that she is in a lot of pain. At her last appointment she was offered surgery and now she would like to get that scheduled.   Best contact number is [phone]

## 2022-11-16 NOTE — Telephone Encounter (Signed)
Patient would like to move forward with surgery. Does she need to return for appointment? Armandina Stammer RN

## 2022-11-17 NOTE — Telephone Encounter (Signed)
Patient scheduled for August 16 at 1030am. Armandina Stammer RN

## 2022-12-02 ENCOUNTER — Ambulatory Visit: Payer: Medicare HMO | Admitting: Obstetrics and Gynecology

## 2022-12-02 VITALS — BP 118/76 | HR 78 | Ht 61.0 in | Wt 160.0 lb

## 2022-12-02 DIAGNOSIS — D219 Benign neoplasm of connective and other soft tissue, unspecified: Secondary | ICD-10-CM | POA: Diagnosis not present

## 2022-12-02 NOTE — Progress Notes (Signed)
    GYNECOLOGY VISIT  Patient name: Renee Payne MRN [id number]  Date of birth: 05/14/1950 Chief Complaint:   Fibroids  History:  Renee Payne is a 72 y.o. G3P3003 being seen today for fibroid and desires surgical management.  States that she is not in pain but wants it out. Her cancer doctor told her that she would need injections to keep her blood count up and she would like to see if having her fibroid/uterus removed would help with her anemia.   Past Medical History:  Diagnosis Date   Diabetes mellitus without complication (HCC)    Hyperlipidemia    Hypertension    Pulmonary emboli (HCC)     Past Surgical History:  Procedure Laterality Date   IVC FILTER INSERTION     PACEMAKER IMPLANT N/A 06/20/2022   Procedure: PACEMAKER IMPLANT - DUAL CHAMBER;  Surgeon: Lanier Prude, MD;  [facility];  Service: Cardiovascular;  Laterality: N/A;   TUBAL LIGATION      The following portions of the patient's history were reviewed and updated as appropriate: allergies, current medications, past family history, past medical history, past social history, past surgical history and problem list.   Review of Systems:  Pertinent items are noted in HPI. Comprehensive review of systems was otherwise negative.   Objective:  Physical Exam BP 118/76   Pulse 78   Ht 5\' 1"  (1.549 m)   Wt 160 lb (72.6 kg)   BMI 30.23 kg/m    Physical Exam Vitals and nursing note reviewed.  Constitutional:      Appearance: Normal appearance.  HENT:     Head: Normocephalic and atraumatic.  Pulmonary:     Effort: Pulmonary effort is normal.  Skin:    General: Skin is warm and dry.  Neurological:     General: No focal deficit present.     Mental Status: She is alert.  Psychiatric:        Mood and Affect: Mood normal.        Behavior: Behavior normal.        Thought Content: Thought content normal.        Judgment: Judgment normal.       Assessment & Plan:   1.  Fibroid Patient with degenerating fibroid vs possible LMS, possibly causing anemia and desires surgical management. Previously reached out to gyn onc, will place referral given possible malignancy and complex  medical history.  - Ambulatory referral to Gynecologic Oncology   Routine preventative health maintenance measures emphasized.  Lorriane Shire, MD Minimally Invasive Gynecologic Surgery Center for [facility], [facility]

## 2022-12-07 ENCOUNTER — Telehealth: Payer: Self-pay

## 2022-12-07 NOTE — Telephone Encounter (Signed)
Spoke with the patient regarding the referral to GYN oncology. Patient scheduled as new patient with Dr Pricilla Holm on 12/30/2022 Patient given an arrival time of 10:45am.  Explained to the patient the the doctor will perform a pelvic exam at this visit. Patient given the policy that only one visitor allowed and that visitor must be over 16 yrs are allowed in the Cancer Center. Patient given the address/phone number for the clinic and that the center offers free valet service. Patient aware that masks are option.

## 2022-12-21 ENCOUNTER — Telehealth: Payer: Self-pay | Admitting: Cardiology

## 2022-12-21 NOTE — Telephone Encounter (Signed)
  1. Has your device fired? no  2. Is you device beeping? no  3. Are you experiencing draining or swelling at device site? no  4. Are you calling to see if we received your device transmission? Yes- received call, that her transmission was not received  5. Have you passed out? no   Please route to Device Clinic Pool

## 2022-12-21 NOTE — Telephone Encounter (Signed)
I spoke with the patient daughter and have put her on the correct schedule.

## 2022-12-28 ENCOUNTER — Encounter: Payer: Self-pay | Admitting: Gynecologic Oncology

## 2022-12-28 NOTE — Progress Notes (Unsigned)
GYNECOLOGIC ONCOLOGY NEW PATIENT CONSULTATION   Patient Name: Renee COGLIANESE  Patient Age: 72 y.o. Date of Service: 12/31/22 Referring Provider: Dr. Lorriane Shire  Primary Care Provider: [facility] Consulting Provider: Eugene Garnet, MD   Assessment/Plan:  Postmenopausal patient with enlarging fundal uterine mass.  I discussed with the patient and her daughter imaging findings including more recent MRI and CT scans. We looked at the CT pictures together. At the time of her last imaging in April, there had been some increase in the size of this fundal mass with imaging findings that suggested possible hemorrhage into the mass. She was admitted at that time with acute on chronic anemia. On exam, there does not appear to have been significant growth of this mass since March.   We discussed possible etiologies of the mass. I think likely this represents a fibroid but there is a possibility that this represents a sarcoma. The patient has had a large amount of weight loss int he last few years, but on review of her diet, it sounds like this was likely related to decrease in caloric intake. Additionally, there are not findings on imaging that suggest metastatic disease.   Given possibility of malignancy, chronic anemia (possibly worsened if there is intermittent bleeding into this mass), and some evidence of compression of the right urinary tract (mild hydroureter), I would typically recommend diagnostic and therapeutic surgery. The patient, due to her multiple comorbidities, is not a ideal surgical candidate. We will need clearance from her cardiologist given CHB with pacemaker in place, clearance from her heme/onc given history of stroke and pulmonary embolism, and from her PCP.  My office will send clearance forms to all 3 offices. Depending on her risk with coming off of anti-coagulation and with major surgery, will also ask her hematologist to address the need for IVC  filter pre-op.   If cleared for surgery, we discussed the plan would be for a robotic total hysterectomy with bilateral salpingo-oophorectomy. Given size of this fundal mass, a mini-lap will be required for specimen delivery. The mass would be sent for frozen section. Depending on findings and whether there is a recommendation for keeping surgery under a certain time, decision would be made intra-op requiring need for and safety of additional procedures (such as lymph node sampling).   We tentatively selected a date for surgery at the end of October. The patient will return closer to surgery for preoperative visit (and hopefully after we have heard from her health care team).   A copy of this note was sent to the patient's referring provider.   70 minutes of total time was spent for this patient encounter, including preparation, face-to-face counseling with the patient and coordination of care, and documentation of the encounter.  Eugene Garnet, MD  Division of Gynecologic Oncology  Department of Obstetrics and Gynecology  [facility]  ___________________________________________  Chief Complaint: No chief complaint on file.   History of Present Illness:  Renee Payne is a 72 y.o. y.o. female who is seen in consultation at the request of Dr. Briscoe Deutscher for an evaluation of large uterine mass.  The patient was admitted in April for symptomatic anemia.  At that time, there had been interval enlargement of a large, dominant uterine fundal mass, suspected to be a fibroid, now measuring up to 13 cm.  On prior MRI from the end of 2023, this mass measured 8.7 x 5.9 x 7.6.  The mass is heterogenous with areas  of increased density.  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  Mild right hydronephrosis and proximal right hydroureter favored to reflect mass effect on the right ureter.  No pelvic adenopathy noted.  Received 2 units of packed red blood cells  during that admission and was transition from warfarin to Eliquis for anticoagulation given her history of pulmonary embolism.  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  She has also been intermittently noted to have thrombocytopenia and anemia.  Her most recent hemoglobin was 9.9.  Patient had a prior ischemic stroke, no residual deficits.  She has a history of pulmonary embolism diagnosed in 2017.  She was found to have a left lower extremity DVT at that time.  She was incidentally found to have an aortic aneurysm at that time with yearly follow-up recommended.  Given massive size of her PE, she was given tPA.  Patient had been on Coumadin but earlier this year was transitioned to Eliquis.  She also has a history of MGUS, stage IIIb chronic kidney disease, hypertension, and heart block requiring a pacemaker.  She has diabetes for which she is currently not on any medications.  Checks her blood sugars from time to time, all under 150.  She endorses dizziness and lightheadedness at baseline, denies any recent changes.  She denies any abdominal or pelvic pain.  She denies any vaginal bleeding or discharge.  She has constipation at baseline, no change recently.  Uses Linzess as needed.  She endorses about a 60 pound weight loss over 3 years.  On review of medical records, weight in April 2021 was 100 kg, now 72 kg.  She notes that this was related to cutting back, she was eating.  Previously she was eating 3 or 4 meals a day with snacks.  She now is eating 2 meals a day.  Describes making this change because she felt like food would get stuck in her throat when she would eat.  She endorses some shortness of breath with ambulation, denies any at rest.  Denies any chest pain.  Has some balance issues and afraid she might fall, so uses a walker to ambulate.  She lives with her sister.  PAST MEDICAL HISTORY:  Past Medical History:  Diagnosis Date   Diabetes mellitus without  complication (HCC)    was on metformin but discontinued   Hyperlipidemia    Hypertension    Pacemaker 2024   Pulmonary emboli [facility])    patient doesn't know why , on lifelong blood thinner   Stroke [facility])      PAST SURGICAL HISTORY:  Past Surgical History:  Procedure Laterality Date   IVC FILTER INSERTION     PACEMAKER IMPLANT N/A 06/20/2022   Procedure: PACEMAKER IMPLANT - DUAL CHAMBER;  Surgeon: Lanier Prude, MD;  Location[facility];  Service: Cardiovascular;  Laterality: N/A;   TUBAL LIGATION      OB/GYN HISTORY:  OB History  Gravida Para Term Preterm AB Living  3 3 3     3   SAB IAB Ectopic Multiple Live Births          3    # Outcome Date GA Lbr Len/2nd Weight Sex Type Anes PTL Lv  3 Term 1977 [redacted]w[redacted]d   F Vag-Spont None N LIV  2 Term 30 [redacted]w[redacted]d   M Vag-Spont None N LIV  1 Term 75 [redacted]w[redacted]d   M Vag-Spont None N LIV    No LMP recorded. Patient is  postmenopausal.  Age at menarche: 81  Age at menopause: 39s Hx of HRT: denies Hx of STDs: denies Last pap: years ago History of abnormal pap smears: denies  SCREENING STUDIES:  Last mammogram: 2021  Last colonoscopy: 2021  MEDICATIONS: Outpatient Encounter Medications as of 12/30/2022  Medication Sig   acetaminophen (TYLENOL) 325 MG tablet Take 2 tablets (650 mg total) by mouth every 4 (four) hours as needed for headache or mild pain.   LINZESS 145 MCG CAPS capsule Take 145 mcg by mouth daily.   losartan (COZAAR) 50 MG tablet Take 50 mg by mouth daily.   rosuvastatin (CRESTOR) 20 MG tablet Take 20 mg by mouth at bedtime.   vitamin B-12 (CYANOCOBALAMIN) 1000 MCG tablet Take 1 tablet (1,000 mcg total) by mouth daily.   White Petrolatum-Mineral Oil (EYE LUBRICANT OP) Place 2 drops into both eyes as needed (dry eye).   apixaban (ELIQUIS) 5 MG TABS tablet Take 1 tablet (5 mg total) by mouth 2 (two) times daily.   Colchicine 0.6 MG CAPS Take 0.6-1.2 mg by mouth See admin instructions. Take 1.2 mg once in the  morning then 0.6 mg that night on the first day. Then take 0.6 mg once daily as needed for a gout flare up.   PARoxetine (PAXIL) 10 MG tablet Take 1 tablet (10 mg total) by mouth daily.   [DISCONTINUED] aspirin EC 81 MG EC tablet Take 1 tablet (81 mg total) by mouth daily. (Patient not taking: Reported on 12/29/2022)   [DISCONTINUED] metFORMIN (GLUCOPHAGE) 1000 MG tablet Take 1,000 mg by mouth daily. (Patient not taking: Reported on 12/29/2022)   No facility-administered encounter medications on file as of 12/30/2022.    ALLERGIES:  Allergies  Allergen Reactions   Ativan [Lorazepam] Other (See Comments)    COMBATIVE AND EXTREMELY VIOLENT   Omeprazole Other (See Comments)    The front of her body felt very warm after one dose of this med.     FAMILY HISTORY:  Family History  Problem Relation Age of Onset   Cervical cancer Mother    Colon cancer Neg Hx    Breast cancer Neg Hx    Endometrial cancer Neg Hx    Pancreatic cancer Neg Hx    Prostate cancer Neg Hx      SOCIAL HISTORY:  Social Connections: Not on file    REVIEW OF SYSTEMS:  + Shortness of breath, ankle swelling, constipation, joint pain, dizziness Denies appetite changes, fevers, chills, fatigue, unexplained weight changes. Denies hearing loss, neck lumps or masses, mouth sores, ringing in ears or voice changes. Denies cough or wheezing.   Denies chest pain or palpitations.  Denies abdominal distention, pain, blood in stools, diarrhea, nausea, vomiting, or early satiety. Denies pain with intercourse, dysuria, frequency, hematuria or incontinence. Denies hot flashes, pelvic pain, vaginal bleeding or vaginal discharge.   Denies back pain or muscle pain/cramps. Denies itching, rash, or wounds. Denies headaches, numbness or seizures. Denies swollen lymph nodes or glands, denies easy bruising or bleeding. Denies anxiety, depression, confusion, or decreased concentration.  Physical Exam:  Vital Signs for this encounter:   Blood pressure 131/78, pulse 92, temperature 99.2 F (37.3 C), temperature source Oral, resp. rate 16, height 5\' 1"  (1.549 m), weight 159 lb 6.4 oz (72.3 kg), SpO2 100%. Body mass index is 30.12 kg/m. General: Alert, oriented, no acute distress.  HEENT: Normocephalic, atraumatic. Sclera anicteric.  Chest: Clear to auscultation bilaterally. No wheezes, rhonchi, or rales. Cardiovascular: Regular rate and rhythm, no murmurs, rubs,  or gallops.  Abdomen: Normoactive bowel sounds. Soft, nondistended, nontender to palpation. No masses or hepatosplenomegaly appreciated. No palpable fluid wave.  Extremities: Grossly normal range of motion. Warm, well perfused. No edema bilaterally.  Skin: No rashes or lesions.  Lymphatics: No cervical, supraclavicular, or inguinal adenopathy.  GU:  Normal external female genitalia. No lesions. No discharge or bleeding.             Bladder/urethra:  No lesions or masses, well supported bladder             Vagina: Moderately atrophic, no lesions.             Cervix: Normal appearing, no lesions.             Uterus: Small, mobile, no parametrial involvement or nodularity.  At the superior aspect of the uterus, appreciated only with the abdominal hand, there is a mass that spans almost up to the umbilicus, moves in conjunction with the uterus, mobile side-to-side.             Adnexa: No masses appreciated.  Rectal: Deferred.  LABORATORY AND RADIOLOGIC DATA:  Outside medical records were reviewed to synthesize the above history, along with the history and physical obtained during the visit.   Lab Results  Component Value Date   WBC 5.2 08/09/2022   HGB 8.6 (L) 08/09/2022   HCT 25.9 (L) 08/09/2022   PLT 425 08/09/2022   GLUCOSE 90 08/09/2022   CHOL 254 (H) 11/14/2016   TRIG 112 11/14/2016   HDL 42 11/14/2016   LDLCALC 190 (H) 11/14/2016   NA 143 08/09/2022   K 4.9 08/09/2022   CL 109 (H) 08/09/2022   CREATININE 1.62 (H) 08/09/2022   BUN 25 08/09/2022   CO2  20 08/09/2022   INR 1.9 (H) 07/29/2022   HGBA1C 6.3 (H) 06/18/2022

## 2022-12-29 ENCOUNTER — Encounter: Payer: Self-pay | Admitting: Gynecologic Oncology

## 2022-12-30 ENCOUNTER — Encounter: Payer: Self-pay | Admitting: Gynecologic Oncology

## 2022-12-30 ENCOUNTER — Inpatient Hospital Stay: Payer: Medicare HMO | Attending: Gynecologic Oncology | Admitting: Gynecologic Oncology

## 2022-12-30 VITALS — BP 131/78 | HR 92 | Temp 99.2°F | Resp 16 | Ht 61.0 in | Wt 159.4 lb

## 2022-12-30 DIAGNOSIS — Z7901 Long term (current) use of anticoagulants: Secondary | ICD-10-CM | POA: Diagnosis not present

## 2022-12-30 DIAGNOSIS — Z86711 Personal history of pulmonary embolism: Secondary | ICD-10-CM | POA: Diagnosis not present

## 2022-12-30 DIAGNOSIS — I442 Atrioventricular block, complete: Secondary | ICD-10-CM

## 2022-12-30 DIAGNOSIS — N1832 Chronic kidney disease, stage 3b: Secondary | ICD-10-CM | POA: Diagnosis not present

## 2022-12-30 DIAGNOSIS — I719 Aortic aneurysm of unspecified site, without rupture: Secondary | ICD-10-CM | POA: Insufficient documentation

## 2022-12-30 DIAGNOSIS — D72819 Decreased white blood cell count, unspecified: Secondary | ICD-10-CM | POA: Diagnosis not present

## 2022-12-30 DIAGNOSIS — Z86718 Personal history of other venous thrombosis and embolism: Secondary | ICD-10-CM | POA: Diagnosis not present

## 2022-12-30 DIAGNOSIS — E785 Hyperlipidemia, unspecified: Secondary | ICD-10-CM | POA: Insufficient documentation

## 2022-12-30 DIAGNOSIS — D508 Other iron deficiency anemias: Secondary | ICD-10-CM

## 2022-12-30 DIAGNOSIS — D390 Neoplasm of uncertain behavior of uterus: Secondary | ICD-10-CM | POA: Diagnosis present

## 2022-12-30 DIAGNOSIS — D649 Anemia, unspecified: Secondary | ICD-10-CM | POA: Insufficient documentation

## 2022-12-30 DIAGNOSIS — Z950 Presence of cardiac pacemaker: Secondary | ICD-10-CM | POA: Insufficient documentation

## 2022-12-30 DIAGNOSIS — E1122 Type 2 diabetes mellitus with diabetic chronic kidney disease: Secondary | ICD-10-CM | POA: Diagnosis not present

## 2022-12-30 DIAGNOSIS — Z79899 Other long term (current) drug therapy: Secondary | ICD-10-CM | POA: Diagnosis not present

## 2022-12-30 DIAGNOSIS — I129 Hypertensive chronic kidney disease with stage 1 through stage 4 chronic kidney disease, or unspecified chronic kidney disease: Secondary | ICD-10-CM | POA: Diagnosis not present

## 2022-12-30 DIAGNOSIS — Z8673 Personal history of transient ischemic attack (TIA), and cerebral infarction without residual deficits: Secondary | ICD-10-CM | POA: Insufficient documentation

## 2022-12-30 DIAGNOSIS — D472 Monoclonal gammopathy: Secondary | ICD-10-CM | POA: Insufficient documentation

## 2022-12-30 DIAGNOSIS — N858 Other specified noninflammatory disorders of uterus: Secondary | ICD-10-CM

## 2022-12-30 DIAGNOSIS — Z8049 Family history of malignant neoplasm of other genital organs: Secondary | ICD-10-CM | POA: Insufficient documentation

## 2022-12-30 DIAGNOSIS — E119 Type 2 diabetes mellitus without complications: Secondary | ICD-10-CM

## 2022-12-30 NOTE — Patient Instructions (Addendum)
It was very nice to meet you today.  We are tentatively scheduling her surgery for October 30.  If you are cleared for surgery, this will include a total hysterectomy with removal of your tubes and ovaries.  I will plan to do this through a little incisions (laparoscopic surgery) but will need to make a larger incision given the size of the mass coming off of the top of the uterus.  I will plan to send everything to the pathologist to look at under the microscope at the time of surgery to see if any other procedures are needed.  We will reach out to your hematologist, primary care provider, and cardiologist.  We will need clearance from all of them prior to proceeding with surgery.  We will have you come back closer to the date of surgery for review of the surgery, risks of surgery, and instructions around the time of surgery.

## 2023-01-03 ENCOUNTER — Telehealth: Payer: Self-pay | Admitting: *Deleted

## 2023-01-03 ENCOUNTER — Telehealth: Payer: Self-pay

## 2023-01-03 NOTE — Telephone Encounter (Signed)
Name: Renee Payne  DOB: 02/09/1951  MRN: [id number]  Primary Cardiologist: None  Chart reviewed as part of pre-operative protocol coverage. Because of [name]'s past medical history and time since last visit, she will require a follow-up in-office visit in order to better assess preoperative cardiovascular risk.  Pre-op covering staff: - Please schedule appointment and call patient to inform them. If patient already had an upcoming appointment within acceptable timeframe, please add "pre-op clearance" to the appointment notes so provider is aware. - Please contact requesting surgeon's office via preferred method (i.e, phone, fax) to inform them of need for appointment prior to surgery.  We do not prescribe Eliquis. Holding parameters will need to come from prescribing physician.  Sharlene Dory, PA-C  01/03/2023, 4:31 PM

## 2023-01-03 NOTE — Telephone Encounter (Signed)
Pre-operative Risk Assessment    Patient Name: Renee Payne  DOB: [birth year] MRN: [id number]      Request for Surgical Clearance    Procedure:   Roboric Assiste Total Hysterectomy, Bilateral Slapingo-oophorectomy, Mini Laparotomy, Possible Lymph Node Sampling  Date of Surgery:  Clearance 02/15/23                                 Surgeon:  Eugene Garnet, MD Surgeon's Group or Practice Name:  [facility] Phone number:  [phone] Fax number:  [phone]   Type of Clearance Requested:   - Medical  - Pharmacy:  Hold Apixaban (Eliquis) pt will need instructions on when/if to hold   Type of Anesthesia:  General    Additional requests/questions:    Wynetta Fines   01/03/2023, 1:04 PM

## 2023-01-03 NOTE — Telephone Encounter (Signed)
Per Dr Pricilla Holm fax records and surgical optimization forms to the patient's PCP, Cardiology and Hematology offices

## 2023-01-03 NOTE — Telephone Encounter (Signed)
Pt has seen [facility] once for her PPM specifically. We have not been prescribing her Eliquis and do not have detailed PMH noted in that visit, would recommend that managing MD who is more familiar with her history provide clearance recs.

## 2023-01-09 ENCOUNTER — Ambulatory Visit (INDEPENDENT_AMBULATORY_CARE_PROVIDER_SITE_OTHER): Payer: Medicare HMO

## 2023-01-09 DIAGNOSIS — I442 Atrioventricular block, complete: Secondary | ICD-10-CM | POA: Diagnosis not present

## 2023-01-09 LAB — CUP PACEART REMOTE DEVICE CHECK
Battery Remaining Longevity: 106 mo
Battery Remaining Percentage: 95.5 %
Battery Voltage: 3.01 V
Brady Statistic AP VP Percent: 2.7 %
Brady Statistic AP VS Percent: 1 %
Brady Statistic AS VP Percent: 97 %
Brady Statistic AS VS Percent: 1 %
Brady Statistic RA Percent Paced: 2.5 %
Brady Statistic RV Percent Paced: 99 %
Date Time Interrogation Session: 20240923020014
Implantable Lead Connection Status: 753985
Implantable Lead Connection Status: 753985
Implantable Lead Implant Date: 20240304
Implantable Lead Implant Date: 20240304
Implantable Lead Location: 753859
Implantable Lead Location: 753860
Implantable Pulse Generator Implant Date: 20240304
Lead Channel Impedance Value: 440 Ohm
Lead Channel Impedance Value: 480 Ohm
Lead Channel Pacing Threshold Amplitude: 0.5 V
Lead Channel Pacing Threshold Amplitude: 1.375 V
Lead Channel Pacing Threshold Pulse Width: 0.5 ms
Lead Channel Pacing Threshold Pulse Width: 0.5 ms
Lead Channel Sensing Intrinsic Amplitude: 12 mV
Lead Channel Sensing Intrinsic Amplitude: 5 mV
Lead Channel Setting Pacing Amplitude: 1.625
Lead Channel Setting Pacing Amplitude: 2 V
Lead Channel Setting Pacing Pulse Width: 0.5 ms
Lead Channel Setting Sensing Sensitivity: 4 mV
Pulse Gen Model: 2272
Pulse Gen Serial Number: 8164109

## 2023-01-19 ENCOUNTER — Telehealth: Payer: Self-pay | Admitting: Cardiology

## 2023-01-19 NOTE — Telephone Encounter (Signed)
See previous clearance encounter.  Renee Payne with GYN Oncology is following up. She would like to know if patient can be contacted to schedule gen cards pre-op appointment.

## 2023-01-19 NOTE — Telephone Encounter (Signed)
Received PCP clearance. Per request refax records and form to Dr Milta Deiters office. Spoke with the cardiology office and they will call and scheduled the patient.   Called and [facility] for the patient to call the office back. Need to see if the patient is taking Eliquis and who the prescribed the medicine.

## 2023-01-19 NOTE — Telephone Encounter (Signed)
Renee Payne E1 hour ago (10:55 AM)   KT See previous clearance encounter.   Marcelino Duster with GYN Oncology is following up. She would like to know if patient can be contacted to schedule gen cards pre-op appointment.      Note   [facility] [phone]  Rolly Pancake

## 2023-01-19 NOTE — Telephone Encounter (Signed)
Surgeon's office called today to check on status and new pt appt with Gen Cards. This pt was no longer in the pre op call back pool. I am not sure why this was not handled. I will send a message to our scheduling team to see if they can help get the pt in as new pt appt.   This seemed to come in while I was on vacation. I will send an update the requesting office as FYI that we will do our very best to get the pt in.

## 2023-01-19 NOTE — Telephone Encounter (Signed)
Leah N. One of our schedulers was able to get the pt in 01/20/23 with Dr. Clifton James for pre op clearance. I will update all parties involved.

## 2023-01-19 NOTE — Telephone Encounter (Signed)
Received hematology clearance

## 2023-01-20 ENCOUNTER — Encounter: Payer: Self-pay | Admitting: Cardiovascular Disease

## 2023-01-20 ENCOUNTER — Ambulatory Visit: Payer: Medicare HMO | Attending: Cardiovascular Disease | Admitting: Cardiovascular Disease

## 2023-01-20 ENCOUNTER — Telehealth: Payer: Self-pay | Admitting: *Deleted

## 2023-01-20 VITALS — BP 152/84 | HR 56 | Ht 61.0 in | Wt 166.2 lb

## 2023-01-20 DIAGNOSIS — I442 Atrioventricular block, complete: Secondary | ICD-10-CM

## 2023-01-20 DIAGNOSIS — Z01810 Encounter for preprocedural cardiovascular examination: Secondary | ICD-10-CM

## 2023-01-20 NOTE — Patient Instructions (Signed)
Medication Instructions:  No changes *If you need a refill on your cardiac medications before your next appointment, please call your pharmacy*   Lab Work: none If you have labs (blood work) drawn today and your tests are completely normal, you will receive your results only by: MyChart Message (if you have MyChart) OR A paper copy in the mail If you have any lab test that is abnormal or we need to change your treatment, we will call you to review the results.   Testing/Procedures: none   Follow-Up: As planned with Dr. Lalla Brothers

## 2023-01-20 NOTE — Progress Notes (Signed)
Chief Complaint  Patient presents with   Follow-up    Pre-operative cardiac risk assessment    History of Present Illness: 72 yo female with history of DM, HLD, HTN, prior CVA, PE and complete heart block s/p permanent pacemaker placement who is added onto my schedule today for pre-operative risk assessment prior to planned surgical resection of a uterine mass. Permanent pacemaker implanted in March 2024 secondary to symptomatic complete heart block. She is followed in our office by Dr. Lalla Brothers. She is not known to have CAD. Echo March 2024 with LVEF=60-65%. Normal RV function. Mild mitral regurgitation.   She tells me today that she has no chest pain, dyspnea, palpitations, lower extremity edema, orthopnea, PND, dizziness, near syncope or syncope. Hysterectomy planned for removal of uterine mass.    Primary Care Physician: [facility], [facility]  Primary Cardiologist: Dr. Lalla Brothers  Past Medical History:  Diagnosis Date   Diabetes mellitus without complication (HCC)    was on metformin but discontinued   Hyperlipidemia    Hypertension    Pacemaker 2024   Pulmonary emboli [facility])    patient doesn't know why , on lifelong blood thinner   Stroke [facility])     Past Surgical History:  Procedure Laterality Date   IVC FILTER INSERTION     PACEMAKER IMPLANT N/A 06/20/2022   Procedure: PACEMAKER IMPLANT - DUAL CHAMBER;  Surgeon: Lanier Prude, MD;  Location: [facility] INVASIVE CV LAB;  Service: Cardiovascular;  Laterality: N/A;   TUBAL LIGATION      Current Outpatient Medications  Medication Sig Dispense Refill   apixaban (ELIQUIS) 5 MG TABS tablet Take 1 tablet (5 mg total) by mouth 2 (two) times daily. 60 tablet 0   LINZESS 145 MCG CAPS capsule Take 145 mcg by mouth daily.     losartan (COZAAR) 50 MG tablet Take 50 mg by mouth daily.     PARoxetine (PAXIL) 10 MG tablet Take 1 tablet (10 mg total) by mouth daily. 30 tablet 0   predniSONE (DELTASONE) 5 MG tablet Take 5 mg by mouth  daily with breakfast. For 3 days     rosuvastatin (CRESTOR) 20 MG tablet Take 20 mg by mouth at bedtime.     vitamin B-12 (CYANOCOBALAMIN) 1000 MCG tablet Take 1 tablet (1,000 mcg total) by mouth daily. 30 tablet 0   White Petrolatum-Mineral Oil (EYE LUBRICANT OP) Place 2 drops into both eyes as needed (dry eye).     acetaminophen (TYLENOL) 325 MG tablet Take 2 tablets (650 mg total) by mouth every 4 (four) hours as needed for headache or mild pain. (Patient not taking: Reported on 01/20/2023)     Colchicine 0.6 MG CAPS Take 0.6-1.2 mg by mouth See admin instructions. Take 1.2 mg once in the morning then 0.6 mg that night on the first day. Then take 0.6 mg once daily as needed for a gout flare up. (Patient not taking: Reported on 01/20/2023)     No current facility-administered medications for this visit.    Allergies  Allergen Reactions   Ativan [Lorazepam] Other (See Comments)    COMBATIVE AND EXTREMELY VIOLENT   Omeprazole Other (See Comments)    The front of her body felt very warm after one dose of this med.    Social History   Socioeconomic History   Marital status: Single    Spouse name: Not on file   Number of children: Not on file   Years of education: Not on file   Highest  education level: Not on file  Occupational History   Not on file  Tobacco Use   Smoking status: Never   Smokeless tobacco: Current    Types: Chew   Tobacco comments:    Chews 2x a day, start at 12  Vaping Use   Vaping status: Never Used  Substance and Sexual Activity   Alcohol use: No   Drug use: No   Sexual activity: Not Currently  Other Topics Concern   Not on file  Social History Narrative   Not on file   Social Determinants of Health   Financial Resource Strain: Not on file  Food Insecurity: Low Risk  (08/16/2022)   [facility]   Hunger Vital Sign    Worried About Running Out of Food in the Last Year: Never true    Ran Out of Food in the Last Year: Never  true  Transportation Needs: No Transportation Needs (08/16/2022)   [facility]   Transportation    In the past 12 months, has lack of reliable transportation kept you from medical appointments, meetings, work or from getting things needed for daily living? : No  Physical Activity: Not on file  Stress: Not on file  Social Connections: Not on file  Intimate Partner Violence: Not At Risk (07/28/2022)   Humiliation, Afraid, Rape, and Kick questionnaire    Fear of Current or Ex-Partner: No    Emotionally Abused: No    Physically Abused: No    Sexually Abused: No    Family History  Problem Relation Age of Onset   Cervical cancer Mother    Colon cancer Neg Hx    Breast cancer Neg Hx    Endometrial cancer Neg Hx    Pancreatic cancer Neg Hx    Prostate cancer Neg Hx     Review of Systems:  As stated in the HPI and otherwise negative.   BP (!) 152/84   Pulse (!) 56   Ht 5\' 1"  (1.549 m)   Wt 75.4 kg   SpO2 100%   BMI 31.40 kg/m   Physical Examination: General: Well developed, well nourished, NAD  HEENT: OP clear, mucus membranes moist  SKIN: warm, dry. No rashes. Neuro: No focal deficits  Musculoskeletal: Muscle strength 5/5 all ext  Psychiatric: Mood and affect normal  Neck: No JVD, no carotid bruits, no thyromegaly, no lymphadenopathy.  Lungs:Clear bilaterally, no wheezes, rhonci, crackles Cardiovascular: Regular rate and rhythm. No murmurs, gallops or rubs. Abdomen:Soft. Bowel sounds present. Non-tender.  Extremities: No lower extremity edema. Pulses are 2 + in the bilateral DP/PT.  EKG:  EKG is ordered today. The ekg ordered today demonstrates EKG Interpretation Date/Time:  Friday January 20 2023 14:38:34 EDT Ventricular Rate:  61 PR Interval:  188 QRS Duration:  168 QT Interval:  466 QTC Calculation: 469 R Axis:   -31  Text Interpretation: AV dual-paced rhythm When compared with ECG of 28-Jul-2022 03:47, Vent. rate has decreased BY  43  BPM Confirmed by Verne Carrow [phone]) on 01/20/2023 2:40:43 PM    Recent Labs: 06/17/2022: B Natriuretic Peptide 1,111.3 07/29/2022: Magnesium 2.2 08/09/2022: BUN 25; Creatinine, Ser 1.62; Hemoglobin 8.6; Platelets 425; Potassium 4.9; Sodium 143   Lipid Panel    Component Value Date/Time   CHOL 254 (H) 11/14/2016 0353   TRIG 112 11/14/2016 0353   HDL 42 11/14/2016 0353   CHOLHDL 6.0 11/14/2016 0353   VLDL 22 11/14/2016 0353   LDLCALC 190 (H) 11/14/2016  0353     Wt Readings from Last 3 Encounters:  01/20/23 75.4 kg  12/30/22 72.3 kg  12/02/22 72.6 kg    Assessment and Plan:   1. Pre-operative cardiac risk assessment: She has no signs or symptoms of angina, heart failure or arrhythmias. EKG today with paced rhythm. Her cardiac examination is normal. She can proceed with her planned surgical procedure without further cardiac workup.   Labs/ tests ordered today include:   Orders Placed This Encounter  Procedures   EKG 12-Lead   Disposition:   F/U with Dr. Lalla Brothers as planned   Signed, Verne Carrow, MD, [facility] 01/20/2023 2:54 PM    [facility] [address] [place], [place], [place]  [address] Phone: [phone]; Fax: [phone]

## 2023-01-20 NOTE — Telephone Encounter (Signed)
Spoke with Ms. Pak and relayed message from [name], NP to have pt hold her Eliquis 3 days prior to her scheduled surgery on Wednesday, October 30 th with Dr. Pricilla Holm. Pt informed to take her last dose of Eliquis on Saturday, October 26th. And then stop taking it on Sunday, Oct. 27th. Pt advised that providers will let her know after surgery when to start back on it. Pt verbalized understanding and wrote the instructions down on her calendar. Pt thanked the office for calling and had no concerns or questions at this time.

## 2023-01-23 NOTE — Progress Notes (Signed)
Remote pacemaker transmission.   

## 2023-01-24 NOTE — Telephone Encounter (Signed)
Received cardiology clearance

## 2023-02-07 ENCOUNTER — Encounter: Payer: Self-pay | Admitting: Gynecologic Oncology

## 2023-02-08 NOTE — Patient Instructions (Signed)
Preparing for your Surgery  Plan for surgery on 02/15/2023 with Dr. Eugene Garnet at [facility]. You will be scheduled for robotic assisted total laparoscopic hysterectomy (removal of the uterus and cervix), bilateral salpingo-oophorectomy (removal of both ovaries and fallopian tubes), possible staging if a precancer or cancer is seen, mini laparotomy (slightly larger incision on your abdomen), possible laparotomy (larger incision on your abdomen if needed).   Pre-operative Testing -You will receive a phone call from presurgical testing at [facility] to arrange for a pre-operative appointment and lab work.  -Bring your insurance card, copy of an advanced directive if applicable, medication list  -At that visit, you will be asked to sign a consent for a possible blood transfusion in case a transfusion becomes necessary during surgery.  The need for a blood transfusion is rare but having consent is a necessary part of your care.     -You should be holding your Eliquis as directed.  -Do not take supplements such as fish oil (omega 3), red yeast rice, turmeric before your surgery. You want to avoid medications with aspirin in them including headache powders such as BC or Goody's), Excedrin migraine.  Day Before Surgery at Home -You will be asked to take in a light diet the day before surgery. You will be advised you can have clear liquids up until 3 hours before your surgery.    Eat a light diet the day before surgery.  Examples including soups, broths, toast, yogurt, mashed potatoes.  AVOID GAS PRODUCING FOODS AND BEVERAGES. Things to avoid include carbonated beverages (fizzy beverages, sodas), raw fruits and raw vegetables (uncooked), or beans.   If your bowels are filled with gas, your surgeon will have difficulty visualizing your pelvic organs which increases your surgical risks.  Your role in recovery Your role is to become active as soon as directed by your doctor,  while still giving yourself time to heal.  Rest when you feel tired. You will be asked to do the following in order to speed your recovery:  - Cough and breathe deeply. This helps to clear and expand your lungs and can prevent pneumonia after surgery.  - STAY ACTIVE WHEN YOU GET HOME. Do mild physical activity. Walking or moving your legs help your circulation and body functions return to normal. Do not try to get up or walk alone the first time after surgery.   -If you develop swelling on one leg or the other, pain in the back of your leg, redness/warmth in one of your legs, please call the office or go to the Emergency Room to have a doppler to rule out a blood clot. For shortness of breath, chest pain-seek care in the Emergency Room as soon as possible. - Actively manage your pain. Managing your pain lets you move in comfort. We will ask you to rate your pain on a scale of zero to 10. It is your responsibility to tell your doctor or nurse where and how much you hurt so your pain can be treated.  Special Considerations -If you are diabetic, you may be placed on insulin after surgery to have closer control over your blood sugars to promote healing and recovery.  This does not mean that you will be discharged on insulin.  If applicable, your oral antidiabetics will be resumed when you are tolerating a solid diet.  -Your final pathology results from surgery should be available around one week after surgery and the results will be relayed to you  when available.  -FMLA forms can be faxed to [phone] and please allow 5-7 business days for completion.  Pain Management After Surgery -You will be prescribed your pain medication and bowel regimen medications before surgery so that you can have these available when you are discharged from the hospital. The pain medication is for use ONLY AFTER surgery and a new prescription will not be given.   -Make sure that you have Tylenol and Ibuprofen IF YOU ARE  ABLE TO TAKE THESE MEDICATIONS at home to use on a regular basis after surgery for pain control. We recommend alternating the medications every hour to six hours since they work differently and are processed in the body differently for pain relief.  -Review the attached handout on narcotic use and their risks and side effects.   Bowel Regimen -You will be prescribed Sennakot-S to take nightly to prevent constipation especially if you are taking the narcotic pain medication intermittently.  It is important to prevent constipation and drink adequate amounts of liquids. You can stop taking this medication when you are not taking pain medication and you are back on your normal bowel routine.  Risks of Surgery Risks of surgery are low but include bleeding, infection, damage to surrounding structures, re-operation, blood clots, and very rarely death.   Blood Transfusion Information (For the consent to be signed before surgery)  We will be checking your blood type before surgery so in case of emergencies, we will know what type of blood you would need.                                            WHAT IS A BLOOD TRANSFUSION?  A transfusion is the replacement of blood or some of its parts. Blood is made up of multiple cells which provide different functions. Red blood cells carry oxygen and are used for blood loss replacement. White blood cells fight against infection. Platelets control bleeding. Plasma helps clot blood. Other blood products are available for specialized needs, such as hemophilia or other clotting disorders. BEFORE THE TRANSFUSION  Who gives blood for transfusions?  You may be able to donate blood to be used at a later date on yourself (autologous donation). Relatives can be asked to donate blood. This is generally not any safer than if you have received blood from a stranger. The same precautions are taken to ensure safety when a relative's blood is donated. Healthy volunteers who  are fully evaluated to make sure their blood is safe. This is blood bank blood. Transfusion therapy is the safest it has ever been in the practice of medicine. Before blood is taken from a donor, a complete history is taken to make sure that person has no history of diseases nor engages in risky social behavior (examples are intravenous drug use or sexual activity with multiple partners). The donor's travel history is screened to minimize risk of transmitting infections, such as malaria. The donated blood is tested for signs of infectious diseases, such as HIV and hepatitis. The blood is then tested to be sure it is compatible with you in order to minimize the chance of a transfusion reaction. If you or a relative donates blood, this is often done in anticipation of surgery and is not appropriate for emergency situations. It takes many days to process the donated blood. RISKS AND COMPLICATIONS Although transfusion therapy is very safe  and saves many lives, the main dangers of transfusion include:  Getting an infectious disease. Developing a transfusion reaction. This is an allergic reaction to something in the blood you were given. Every precaution is taken to prevent this. The decision to have a blood transfusion has been considered carefully by your caregiver before blood is given. Blood is not given unless the benefits outweigh the risks.  AFTER SURGERY INSTRUCTIONS  Return to work: 4-6 weeks if applicable  Activity: 1. Be up and out of the bed during the day.  Take a nap if needed.  You may walk up steps but be careful and use the hand rail.  Stair climbing will tire you more than you think, you may need to stop part way and rest.   2. No lifting or straining for 6 weeks over 10 pounds. No pushing, pulling, straining for 6 weeks.  3. No driving for around 1 week(s).  Do not drive if you are taking narcotic pain medicine and make sure that your reaction time has returned.   4. You can shower as  soon as the next day after surgery. Shower daily.  Use your regular soap and water (not directly on the incision) and pat your incision(s) dry afterwards; don't rub.  No tub baths or submerging your body in water until cleared by your surgeon. If you have the soap that was given to you by pre-surgical testing that was used before surgery, you do not need to use it afterwards because this can irritate your incisions.   5. No sexual activity and nothing in the vagina for 10 weeks.  6. You may experience a small amount of clear drainage from your incisions, which is normal.  If the drainage persists, increases, or changes color please call the office.  7. Do not use creams, lotions, or ointments such as neosporin on your incisions after surgery until advised by your surgeon because they can cause removal of the dermabond glue on your incisions.    8. You may experience vaginal spotting after surgery or when the stitches at the top of the vagina begin to dissolve.  The spotting is normal but if you experience heavy bleeding, call our office.  9. Take Tylenol or ibuprofen first for pain if you are able to take these medications and only use narcotic pain medication for severe pain not relieved by the Tylenol or Ibuprofen.  Monitor your Tylenol intake to a max of 4,000 mg in a 24 hour period. You can alternate these medications after surgery.  Diet: 1. Low sodium Heart Healthy Diet is recommended but you are cleared to resume your normal (before surgery) diet after your procedure.  2. It is safe to use a laxative, such as Miralax or Colace, if you have difficulty moving your bowels. You have been prescribed Sennakot-S to take at bedtime every evening after surgery to keep bowel movements regular and to prevent constipation.    Wound Care: 1. Keep clean and dry.  Shower daily.  Reasons to call the Doctor: Fever - Oral temperature greater than 100.4 degrees Fahrenheit Foul-smelling vaginal  discharge Difficulty urinating Nausea and vomiting Increased pain at the site of the incision that is unrelieved with pain medicine. Difficulty breathing with or without chest pain New calf pain especially if only on one side Sudden, continuing increased vaginal bleeding with or without clots.   Contacts: For questions or concerns you should contact:  Dr. Eugene Garnet at [phone]  [name], NP at [phone]  After Hours: call [phone] and have the GYN Oncologist paged/contacted (after 5 pm or on the weekends). You will speak with an after hours RN and let he or she know you have had surgery.  Messages sent via mychart are for non-urgent matters and are not responded to after hours so for urgent needs, please call the after hours number.

## 2023-02-09 ENCOUNTER — Telehealth: Payer: Self-pay | Admitting: Oncology

## 2023-02-09 NOTE — Patient Instructions (Addendum)
Preparing for your Surgery  Plan for surgery on 02/15/2023 with Dr. Eugene Garnet at [facility]. You will be scheduled for robotic assisted total laparoscopic hysterectomy (removal of the uterus and cervix), bilateral salpingo-oophorectomy (removal of both ovaries and fallopian tubes), possible staging if a precancer or cancer is seen, mini laparotomy (slightly larger incision on your abdomen), possible laparotomy (larger incision on your abdomen if needed).   Pre-operative Testing -You will receive a phone call from presurgical testing at [facility] to arrange for a pre-operative appointment and lab work.  -Bring your insurance card, copy of an advanced directive if applicable, medication list  -At that visit, you will be asked to sign a consent for a possible blood transfusion in case a transfusion becomes necessary during surgery.  The need for a blood transfusion is rare but having consent is a necessary part of your care.     -You should be holding your Eliquis as directed.  -Do not take supplements such as fish oil (omega 3), red yeast rice, turmeric before your surgery. You want to avoid medications with aspirin in them including headache powders such as BC or Goody's), Excedrin migraine.  Day Before Surgery at Home -You will be asked to take in a light diet the day before surgery. You will be advised you can have clear liquids up until 3 hours before your surgery.    Eat a light diet the day before surgery.  Examples including soups, broths, toast, yogurt, mashed potatoes.  AVOID GAS PRODUCING FOODS AND BEVERAGES. Things to avoid include carbonated beverages (fizzy beverages, sodas), raw fruits and raw vegetables (uncooked), or beans.   If your bowels are filled with gas, your surgeon will have difficulty visualizing your pelvic organs which increases your surgical risks.  Your role in recovery Your role is to become active as soon as directed by your doctor,  while still giving yourself time to heal.  Rest when you feel tired. You will be asked to do the following in order to speed your recovery:  - Cough and breathe deeply. This helps to clear and expand your lungs and can prevent pneumonia after surgery.  - STAY ACTIVE WHEN YOU GET HOME. Do mild physical activity. Walking or moving your legs help your circulation and body functions return to normal. Do not try to get up or walk alone the first time after surgery.   -If you develop swelling on one leg or the other, pain in the back of your leg, redness/warmth in one of your legs, please call the office or go to the Emergency Room to have a doppler to rule out a blood clot. For shortness of breath, chest pain-seek care in the Emergency Room as soon as possible. - Actively manage your pain. Managing your pain lets you move in comfort. We will ask you to rate your pain on a scale of zero to 10. It is your responsibility to tell your doctor or nurse where and how much you hurt so your pain can be treated.  Special Considerations -If you are diabetic, you may be placed on insulin after surgery to have closer control over your blood sugars to promote healing and recovery.  This does not mean that you will be discharged on insulin.  If applicable, your oral antidiabetics will be resumed when you are tolerating a solid diet.  -Your final pathology results from surgery should be available around one week after surgery and the results will be relayed to you  when available.  -FMLA forms can be faxed to [phone] and please allow 5-7 business days for completion.  Pain Management After Surgery -You will be prescribed your pain medication and bowel regimen medications before surgery so that you can have these available when you are discharged from the hospital. The pain medication is for use ONLY AFTER surgery and a new prescription will not be given.   -Make sure that you have Tylenol and Ibuprofen IF YOU ARE  ABLE TO TAKE THESE MEDICATIONS at home to use on a regular basis after surgery for pain control. We recommend alternating the medications every hour to six hours since they work differently and are processed in the body differently for pain relief.  -Review the attached handout on narcotic use and their risks and side effects.   Bowel Regimen -You can continue taking your linzess. Make sure you are preventing constipation.  Risks of Surgery Risks of surgery are low but include bleeding, infection, damage to surrounding structures, re-operation, blood clots, and very rarely death.   Blood Transfusion Information (For the consent to be signed before surgery)  We will be checking your blood type before surgery so in case of emergencies, we will know what type of blood you would need.                                            WHAT IS A BLOOD TRANSFUSION?  A transfusion is the replacement of blood or some of its parts. Blood is made up of multiple cells which provide different functions. Red blood cells carry oxygen and are used for blood loss replacement. White blood cells fight against infection. Platelets control bleeding. Plasma helps clot blood. Other blood products are available for specialized needs, such as hemophilia or other clotting disorders. BEFORE THE TRANSFUSION  Who gives blood for transfusions?  You may be able to donate blood to be used at a later date on yourself (autologous donation). Relatives can be asked to donate blood. This is generally not any safer than if you have received blood from a stranger. The same precautions are taken to ensure safety when a relative's blood is donated. Healthy volunteers who are fully evaluated to make sure their blood is safe. This is blood bank blood. Transfusion therapy is the safest it has ever been in the practice of medicine. Before blood is taken from a donor, a complete history is taken to make sure that person has no history of  diseases nor engages in risky social behavior (examples are intravenous drug use or sexual activity with multiple partners). The donor's travel history is screened to minimize risk of transmitting infections, such as malaria. The donated blood is tested for signs of infectious diseases, such as HIV and hepatitis. The blood is then tested to be sure it is compatible with you in order to minimize the chance of a transfusion reaction. If you or a relative donates blood, this is often done in anticipation of surgery and is not appropriate for emergency situations. It takes many days to process the donated blood. RISKS AND COMPLICATIONS Although transfusion therapy is very safe and saves many lives, the main dangers of transfusion include:  Getting an infectious disease. Developing a transfusion reaction. This is an allergic reaction to something in the blood you were given. Every precaution is taken to prevent this. The decision to have a  blood transfusion has been considered carefully by your caregiver before blood is given. Blood is not given unless the benefits outweigh the risks.  AFTER SURGERY INSTRUCTIONS  Return to work: 4-6 weeks if applicable  Activity: 1. Be up and out of the bed during the day.  Take a nap if needed.  You may walk up steps but be careful and use the hand rail.  Stair climbing will tire you more than you think, you may need to stop part way and rest.   2. No lifting or straining for 6 weeks over 10 pounds. No pushing, pulling, straining for 6 weeks.  3. No driving for around 1 week(s).  Do not drive if you are taking narcotic pain medicine and make sure that your reaction time has returned.   4. You can shower as soon as the next day after surgery. Shower daily.  Use your regular soap and water (not directly on the incision) and pat your incision(s) dry afterwards; don't rub.  No tub baths or submerging your body in water until cleared by your surgeon. If you have the soap  that was given to you by pre-surgical testing that was used before surgery, you do not need to use it afterwards because this can irritate your incisions.   5. No sexual activity and nothing in the vagina for 10 weeks.  6. You may experience a small amount of clear drainage from your incisions, which is normal.  If the drainage persists, increases, or changes color please call the office.  7. Do not use creams, lotions, or ointments such as neosporin on your incisions after surgery until advised by your surgeon because they can cause removal of the dermabond glue on your incisions.    8. You may experience vaginal spotting after surgery or when the stitches at the top of the vagina begin to dissolve.  The spotting is normal but if you experience heavy bleeding, call our office.  9. Take Tylenol or ibuprofen first for pain if you are able to take these medications and only use narcotic pain medication for severe pain not relieved by the Tylenol or Ibuprofen.  Monitor your Tylenol intake to a max of 4,000 mg in a 24 hour period. You can alternate these medications after surgery.  Diet: 1. Low sodium Heart Healthy Diet is recommended but you are cleared to resume your normal (before surgery) diet after your procedure.  2. It is safe to use a laxative, such as Miralax or Colace, if you have difficulty moving your bowels. You can continue use of linzess to prevent constipation. Wound Care: 1. Keep clean and dry.  Shower daily.  Reasons to call the Doctor: Fever - Oral temperature greater than 100.4 degrees Fahrenheit Foul-smelling vaginal discharge Difficulty urinating Nausea and vomiting Increased pain at the site of the incision that is unrelieved with pain medicine. Difficulty breathing with or without chest pain New calf pain especially if only on one side Sudden, continuing increased vaginal bleeding with or without clots.   Contacts: For questions or concerns you should contact:  Dr.  Eugene Garnet at [phone]  [name], NP at [phone]  After Hours: call [phone] and have the GYN Oncologist paged/contacted (after 5 pm or on the weekends). You will speak with an after hours RN and let he or she know you have had surgery.  Messages sent via mychart are for non-urgent matters and are not responded to after hours so for urgent needs, please call the after  hours number.

## 2023-02-09 NOTE — Progress Notes (Signed)
Gynecologic Oncology Return Clinic Visit  02/10/23  Reason for Visit: treatment planning  Treatment History: The patient was admitted in April for symptomatic anemia.  At that time, there had been interval enlargement of a large, dominant uterine fundal mass, suspected to be a fibroid, now measuring up to 13 cm.  On prior MRI from the end of 2023, this mass measured 8.7 x 5.9 x 7.6.  The mass is heterogenous with areas of increased density.  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  Mild right hydronephrosis and proximal right hydroureter favored to reflect mass effect on the right ureter.  No pelvic adenopathy noted.  Received 2 units of packed red blood cells during that admission and was transition from warfarin to Eliquis for anticoagulation given her history of pulmonary embolism.   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  She has also been intermittently noted to have thrombocytopenia and anemia.  Her most recent hemoglobin was 9.9.   Patient had a prior ischemic stroke, no residual deficits.  She has a history of pulmonary embolism diagnosed in 2017.  She was found to have a left lower extremity DVT at that time.  She was incidentally found to have an aortic aneurysm at that time with yearly follow-up recommended.  Given massive size of her PE, she was given tPA.  Patient had been on Coumadin but earlier this year was transitioned to Eliquis.  She also has a history of MGUS, stage IIIb chronic kidney disease, hypertension, and heart block requiring a pacemaker.  She has diabetes for which she is currently not on any medications.  Checks her blood sugars from time to time, all under 150.  Interval History: Doing well.  Denies any significant change in terms of abdominal symptoms since her last visit with me.  Developed a runny nose over the weekend, had chills on Sunday night with some shortness of breath and difficulty breathing.  This resolved and  she is feeling much better in terms of congestion.  Stopped prednisone last Thursday, which she had been taking for about a month for joint pain.  Past Medical/Surgical History: Past Medical History:  Diagnosis Date   Diabetes mellitus without complication (HCC)    was on metformin but discontinued   Hyperlipidemia    Hypertension    Pacemaker 2024   Pulmonary emboli [facility])    patient doesn't know why , on lifelong blood thinner   Stroke [facility])     Past Surgical History:  Procedure Laterality Date   IVC FILTER INSERTION     PACEMAKER IMPLANT N/A 06/20/2022   Procedure: PACEMAKER IMPLANT - DUAL CHAMBER;  Surgeon: Lanier Prude, MD;  Location: [facility] INVASIVE CV LAB;  Service: Cardiovascular;  Laterality: N/A;   TUBAL LIGATION      Family History  Problem Relation Age of Onset   Cervical cancer Mother    Colon cancer Neg Hx    Breast cancer Neg Hx    Endometrial cancer Neg Hx    Pancreatic cancer Neg Hx    Prostate cancer Neg Hx     Social History   Socioeconomic History   Marital status: Single    Spouse name: Not on file   Number of children: Not on file   Years of education: Not on file   Highest education level: Not on file  Occupational History   Not on file  Tobacco Use   Smoking status: Never   Smokeless tobacco: Current  Types: Chew   Tobacco comments:    Chews 2x a day, start at 12  Vaping Use   Vaping status: Never Used  Substance and Sexual Activity   Alcohol use: No   Drug use: No   Sexual activity: Not Currently  Other Topics Concern   Not on file  Social History Narrative   Not on file   Social Determinants of Health   Financial Resource Strain: Not on file  Food Insecurity: Low Risk  (08/16/2022)   [facility]   Hunger Vital Sign    Worried About Running Out of Food in the Last Year: Never true    Ran Out of Food in the Last Year: Never true  Transportation Needs: No Transportation Needs (08/16/2022)    [facility]   Transportation    In the past 12 months, has lack of reliable transportation kept you from medical appointments, meetings, work or from getting things needed for daily living? : No  Physical Activity: Not on file  Stress: Not on file  Social Connections: Not on file    Current Medications:  Current Outpatient Medications:    acetaminophen (TYLENOL) 500 MG tablet, Take 1,000 mg by mouth every 6 (six) hours as needed for moderate pain., Disp: , Rfl:    apixaban (ELIQUIS) 5 MG TABS tablet, Take 1 tablet (5 mg total) by mouth 2 (two) times daily., Disp: 60 tablet, Rfl: 0   Coenzyme Q10 (COQ10) 100 MG CAPS, Take 100 mg by mouth 2 (two) times daily., Disp: , Rfl:    LINZESS 145 MCG CAPS capsule, Take 145 mcg by mouth daily., Disp: , Rfl:    losartan (COZAAR) 50 MG tablet, Take 50 mg by mouth daily., Disp: , Rfl:    rosuvastatin (CRESTOR) 20 MG tablet, Take 20 mg by mouth at bedtime., Disp: , Rfl:    vitamin B-12 (CYANOCOBALAMIN) 500 MCG tablet, Take 500 mcg by mouth daily., Disp: , Rfl:    White Petrolatum-Mineral Oil (EYE LUBRICANT OP), Place 2 drops into both eyes as needed (dry eye)., Disp: , Rfl:   Review of Systems: + chills, ringing in ears, constipation, dizziness Denies appetite changes, fevers, chills, fatigue, unexplained weight changes. Denies hearing loss, neck lumps or masses, mouth sores, ringing in ears or voice changes. Denies cough or wheezing.  Denies shortness of breath. Denies chest pain or palpitations. Denies leg swelling. Denies abdominal distention, pain, blood in stools, constipation, diarrhea, nausea, vomiting, or early satiety. Denies pain with intercourse, dysuria, frequency, hematuria or incontinence. Denies hot flashes, pelvic pain, vaginal bleeding or vaginal discharge.   Denies joint pain, back pain or muscle pain/cramps. Denies itching, rash, or wounds. Denies dizziness, headaches, numbness or seizures. Denies  swollen lymph nodes or glands, denies easy bruising or bleeding. Denies anxiety, depression, confusion, or decreased concentration.  Physical Exam: BP 110/77 (BP Location: Left Arm, Patient Position: Sitting) Comment: Notified Rn rechecked first was 141/114  Pulse 74   Temp 99 F (37.2 C) (Oral)   Resp 20   Wt 163 lb 6.4 oz (74.1 kg)   SpO2 100%   BMI 30.87 kg/m  General: Alert, oriented, no acute distress. HEENT: Normocephalic, atraumatic, sclera anicteric. Chest: Unlabored breathing on room air.  Laboratory & Radiologic Studies: None new  Assessment & Plan: Renee Payne is a 72 y.o. woman with an enlarging fundal uterine mass.   Patient presents today for treatment planning.  She has been cleared by cardiology  without further testing.  She will come off Eliquis 3 days before surgery and we will start as soon as safe after.  Typically I would plan on Lovenox for 2 days postoperatively before restarting prophylactic dosing of Eliquis until 5-7 days postoperatively at which time, as long as no bleeding, the patient the increase back to her therapeutic dosing.  My office will reach out to hematology to assure no recommendations for IVC filter preoperatively.  We discussed the plan for robotic surgery with a mini lap required due to size of fundal uterine mass.  Uterus will be sent for frozen section.  Given her comorbidities, if malignancy identified, decision will be made if safe and appropriate for staging indicated (such as lymph node sampling).  We reviewed the plan for a robotic assisted hysterectomy, bilateral salpingo-oophorectomy, mini laparotomy, possible staging. The risks of surgery were discussed in detail and she understands these to include infection; wound separation; hernia; vaginal cuff separation, injury to adjacent organs such as bowel, bladder, blood vessels, ureters and nerves; bleeding which may require blood transfusion; anesthesia risk; thromboembolic events;  possible death; unforeseen complications; possible need for re-exploration; medical complications such as heart attack, stroke, pleural effusion and pneumonia; and, if full lymphadenectomy is performed the risk of lymphedema and lymphocyst. The patient will receive DVT and antibiotic prophylaxis as indicated. She voiced a clear understanding. She had the opportunity to ask questions. Perioperative instructions were reviewed with her. Prescriptions for post-op medications were sent to her pharmacy of choice.  28 minutes of total time was spent for this patient encounter, including preparation, face-to-face counseling with the patient and coordination of care, and documentation of the encounter.  Eugene Garnet, MD  Division of Gynecologic Oncology  Department of Obstetrics and Gynecology  [facility]

## 2023-02-09 NOTE — Progress Notes (Addendum)
COVID Vaccine received:  []  No [x]  Yes Date of any COVID positive Test in last 90 days: no PCP - [facility]. Cardiologist - Dr.  Steffanie Dunn Electrophys.Steffanie Dunn MD  Cardiac Clearance 01/20/23 Dr. Earney Hamburg  Chest x-ray - 07/28/22 Epic EKG - 01/20/23 Epic  Stress Test -  ECHO - 06/20/22 Epic Cardiac Cath -   Bowel Prep - [x]  No  []   Yes ______  Pacemaker / ICD device []  No [x]  Yes   Spinal Cord Stimulator:[x]  No []  Yes       History of Sleep Apnea? []  No [x]  Yes   CPAP used?- [x]  No []  Yes    Does the patient monitor blood sugar?          []  No [x]  Yes  []  N/A  Patient has: []  NO Hx DM   []  Pre-DM                 []  DM1  [x]   DM2 Does patient have a [employer] or Dexacom? []  No []  Yes   Fasting Blood Sugar Ranges- 113 Checks Blood Sugar ___1__ times a  week  GLP1 agonist / usual dose - no GLP1 instructions:  SGLT-2 inhibitors / usual dose - no SGLT-2 instructions:   Blood Thinner / Instructions:Eliquis- Will stop Sunday 02/12/24 Aspirin Instructions:no  Comments:   Activity level: Patient is able  to climb a flight of stairs  [x]  No CP  [x]  No SOB,   Patient can  perform ADLs without assistance.   Anesthesia review: HTN,Stroke, Complete heartblock, Pacemaker, PE, IVC filter,DM, OSA-noncompliant  Patient denies shortness of breath, fever, cough and chest pain at PAT appointment.  Patient verbalized understanding and agreement to the Pre-Surgical Instructions that were given to them at this PAT appointment. Patient was also educated of the need to review these PAT instructions again prior to his/her surgery.I reviewed the appropriate phone numbers to call if they have any and questions or concerns.

## 2023-02-09 NOTE — Patient Instructions (Addendum)
SURGICAL WAITING ROOM VISITATION  Patients having surgery or a procedure may have no more than 2 support people in the waiting area - these visitors may rotate.    Children under the age of 45 must have an adult with them who is not the patient.  Due to an increase in RSV and influenza rates and associated hospitalizations, children ages 71 and under may not visit patients in [facility] hospitals.  If the patient needs to stay at the hospital during part of their recovery, the visitor guidelines for inpatient rooms apply. Pre-op nurse will coordinate an appropriate time for 1 support person to accompany patient in pre-op.  This support person may not rotate.    Please refer to the [facility] website for the visitor guidelines for Inpatients (after your surgery is over and you are in a regular room).       Your procedure is scheduled on: 02/15/23   Report to [facility] Main Entrance    Report to admitting at 5:15 AM   Call this number if you have problems the morning of surgery [phone]   Do not eat food :After Midnight .Eat a light diet the day before surgery..Avoid gas producing food and drink the day before surgery.    After Midnight you may have the following liquids until 4:30 AM  DAY OF SURGERY  Water Non-Citrus Juices (without pulp, NO RED-Apple, White grape, White cranberry) Black Coffee (NO MILK/CREAM OR CREAMERS, sugar ok)  Clear Tea (NO MILK/CREAM OR CREAMERS, sugar ok) regular and decaf                             Plain Jell-O (NO RED)                                           Fruit ices (not with fruit pulp, NO RED)                                     Popsicles (NO RED)                                                               Sports drinks like Gatorade (NO RED)                 FOLLOW BOWEL PREP AND ANY ADDITIONAL PRE OP INSTRUCTIONS YOU RECEIVED FROM YOUR SURGEON'S OFFICE!!!     Oral Hygiene is also important to reduce your risk of infection.                                     Remember - BRUSH YOUR TEETH THE MORNING OF SURGERY WITH YOUR REGULAR TOOTHPASTE   Stop all vitamins and herbal supplements 7 days before surgery.   Take these medicines the morning of surgery with A SIP OF WATER: Linzess, Rosuvastatin, tylenol if needed.             You may not have any metal on your  body including hair pins, jewelry, and body piercing             Do not wear make-up, lotions, powders, perfumes/cologne, or deodorant  Do not wear nail polish including gel and S&S, artificial/acrylic nails, or any other type of covering on natural nails including finger and toenails. If you have artificial nails, gel coating, etc. that needs to be removed by a nail salon please have this removed prior to surgery or surgery may need to be canceled/ delayed if the surgeon/ anesthesia feels like they are unable to be safely monitored.   Do not shave  48 hours prior to surgery.    Do not bring valuables to the hospital. [facility] IS NOT             RESPONSIBLE   FOR VALUABLES.   Contacts, glasses, dentures or bridgework may not be worn into surgery.   Bring small overnight bag day of surgery.   DO NOT BRING YOUR HOME MEDICATIONS TO THE HOSPITAL. PHARMACY WILL DISPENSE MEDICATIONS LISTED ON YOUR MEDICATION LIST TO YOU DURING YOUR ADMISSION IN THE HOSPITAL!    Patients discharged on the day of surgery will not be allowed to drive home.  Someone NEEDS to stay with you for the first 24 hours after anesthesia.   Special Instructions: Bring a copy of your healthcare power of attorney and living will documents the day of surgery if you haven't scanned them before.              Please read over the following fact sheets you were given: IF YOU HAVE QUESTIONS ABOUT YOUR PRE-OP INSTRUCTIONS PLEASE CALL [phone] Rosey [place]   If you received a COVID test during your pre-op visit  it is requested that you wear a mask when out in public, stay away from anyone that may  not be feeling well and notify your surgeon if you develop symptoms. If you test positive for Covid or have been in contact with anyone that has tested positive in the last 10 days please notify you surgeon.    [facility] - Preparing for Surgery Before surgery, you can play an important role.  Because skin is not sterile, your skin needs to be as free of germs as possible.  You can reduce the number of germs on your skin by washing with CHG (chlorahexidine gluconate) soap before surgery.  CHG is an antiseptic cleaner which kills germs and bonds with the skin to continue killing germs even after washing. Please DO NOT use if you have an allergy to CHG or antibacterial soaps.  If your skin becomes reddened/irritated stop using the CHG and inform your nurse when you arrive at Short Stay. Do not shave (including legs and underarms) for at least 48 hours prior to the first CHG shower.  You may shave your face/neck.  Please follow these instructions carefully:  1.  Shower with CHG Soap the night before surgery and the  morning of surgery.  2.  If you choose to wash your hair, wash your hair first as usual with your normal  shampoo.  3.  After you shampoo, rinse your hair and body thoroughly to remove the shampoo.                             4.  Use CHG as you would any other liquid soap.  You can apply chg directly to the skin and wash.  Gently with  a scrungie or clean washcloth.  5.  Apply the CHG Soap to your body ONLY FROM THE NECK DOWN.   Do   not use on face/ open                           Wound or open sores. Avoid contact with eyes, ears mouth and   genitals (private parts).                       Wash face,  Genitals (private parts) with your normal soap.             6.  Wash thoroughly, paying special attention to the area where your    surgery  will be performed.  7.  Thoroughly rinse your body with warm water from the neck down.  8.  DO NOT shower/wash with your normal soap after using and  rinsing off the CHG Soap.                9.  Pat yourself dry with a clean towel.            10.  Wear clean pajamas.            11.  Place clean sheets on your bed the night of your first shower and do not  sleep with pets. Day of Surgery : Do not apply any lotions/deodorants the morning of surgery.  Please wear clean clothes to the hospital/surgery center.  FAILURE TO FOLLOW THESE INSTRUCTIONS MAY RESULT IN THE CANCELLATION OF YOUR SURGERY  PATIENT SIGNATURE_________________________________  NURSE SIGNATURE__________________________________   ________________________________________________________________________ WHAT IS A BLOOD TRANSFUSION? Blood Transfusion Information  A transfusion is the replacement of blood or some of its parts. Blood is made up of multiple cells which provide different functions. Red blood cells carry oxygen and are used for blood loss replacement. White blood cells fight against infection. Platelets control bleeding. Plasma helps clot blood. Other blood products are available for specialized needs, such as hemophilia or other clotting disorders. BEFORE THE TRANSFUSION  Who gives blood for transfusions?  Healthy volunteers who are fully evaluated to make sure their blood is safe. This is blood bank blood. Transfusion therapy is the safest it has ever been in the practice of medicine. Before blood is taken from a donor, a complete history is taken to make sure that person has no history of diseases nor engages in risky social behavior (examples are intravenous drug use or sexual activity with multiple partners). The donor's travel history is screened to minimize risk of transmitting infections, such as malaria. The donated blood is tested for signs of infectious diseases, such as HIV and hepatitis. The blood is then tested to be sure it is compatible with you in order to minimize the chance of a transfusion reaction. If you or a relative donates blood, this is  often done in anticipation of surgery and is not appropriate for emergency situations. It takes many days to process the donated blood. RISKS AND COMPLICATIONS Although transfusion therapy is very safe and saves many lives, the main dangers of transfusion include:  Getting an infectious disease. Developing a transfusion reaction. This is an allergic reaction to something in the blood you were given. Every precaution is taken to prevent this. The decision to have a blood transfusion has been considered carefully by your caregiver before blood is given. Blood is not given unless the benefits outweigh the risks.  AFTER THE TRANSFUSION Right after receiving a blood transfusion, you will usually feel much better and more energetic. This is especially true if your red blood cells have gotten low (anemic). The transfusion raises the level of the red blood cells which carry oxygen, and this usually causes an energy increase. The nurse administering the transfusion will monitor you carefully for complications. HOME CARE INSTRUCTIONS  No special instructions are needed after a transfusion. You may find your energy is better. Speak with your caregiver about any limitations on activity for underlying diseases you may have. SEEK MEDICAL CARE IF:  Your condition is not improving after your transfusion. You develop redness or irritation at the intravenous (IV) site. SEEK IMMEDIATE MEDICAL CARE IF:  Any of the following symptoms occur over the next 12 hours: Shaking chills. You have a temperature by mouth above 102 F (38.9 C), not controlled by medicine. Chest, back, or muscle pain. People around you feel you are not acting correctly or are confused. Shortness of breath or difficulty breathing. Dizziness and fainting. You get a rash or develop hives. You have a decrease in urine output. Your urine turns a dark color or changes to pink, red, or brown. Any of the following symptoms occur over the next 10  days: You have a temperature by mouth above 102 F (38.9 C), not controlled by medicine. Shortness of breath. Weakness after normal activity. The white part of the eye turns yellow (jaundice). You have a decrease in the amount of urine or are urinating less often. Your urine turns a dark color or changes to pink, red, or brown. Document Released: 04/01/2000 Document Revised: 06/27/2011 Document Reviewed: 11/19/2007 [facility] Patient Information 2014 [place], [place].

## 2023-02-09 NOTE — H&P (View-Only) (Signed)
Gynecologic Oncology Return Clinic Visit  02/10/23  Reason for Visit: treatment planning  Treatment History: The patient was admitted in April for symptomatic anemia.  At that time, there had been interval enlargement of a large, dominant uterine fundal mass, suspected to be a fibroid, now measuring up to 13 cm.  On prior MRI from the end of 2023, this mass measured 8.7 x 5.9 x 7.6.  The mass is heterogenous with areas of increased density.  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  Mild right hydronephrosis and proximal right hydroureter favored to reflect mass effect on the right ureter.  No pelvic adenopathy noted.  Received 2 units of packed red blood cells during that admission and was transition from warfarin to Eliquis for anticoagulation given her history of pulmonary embolism.   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  She has also been intermittently noted to have thrombocytopenia and anemia.  Her most recent hemoglobin was 9.9.   Patient had a prior ischemic stroke, no residual deficits.  She has a history of pulmonary embolism diagnosed in 2017.  She was found to have a left lower extremity DVT at that time.  She was incidentally found to have an aortic aneurysm at that time with yearly follow-up recommended.  Given massive size of her PE, she was given tPA.  Patient had been on Coumadin but earlier this year was transitioned to Eliquis.  She also has a history of MGUS, stage IIIb chronic kidney disease, hypertension, and heart block requiring a pacemaker.  She has diabetes for which she is currently not on any medications.  Checks her blood sugars from time to time, all under 150.  Interval History: Doing well.  Denies any significant change in terms of abdominal symptoms since her last visit with me.  Developed a runny nose over the weekend, had chills on Sunday night with some shortness of breath and difficulty breathing.  This resolved and  she is feeling much better in terms of congestion.  Stopped prednisone last Thursday, which she had been taking for about a month for joint pain.  Past Medical/Surgical History: Past Medical History:  Diagnosis Date   Diabetes mellitus without complication (HCC)    was on metformin but discontinued   Hyperlipidemia    Hypertension    Pacemaker 2024   Pulmonary emboli [facility])    patient doesn't know why , on lifelong blood thinner   Stroke [facility])     Past Surgical History:  Procedure Laterality Date   IVC FILTER INSERTION     PACEMAKER IMPLANT N/A 06/20/2022   Procedure: PACEMAKER IMPLANT - DUAL CHAMBER;  Surgeon: Lanier Prude, MD;  Location: [facility] INVASIVE CV LAB;  Service: Cardiovascular;  Laterality: N/A;   TUBAL LIGATION      Family History  Problem Relation Age of Onset   Cervical cancer Mother    Colon cancer Neg Hx    Breast cancer Neg Hx    Endometrial cancer Neg Hx    Pancreatic cancer Neg Hx    Prostate cancer Neg Hx     Social History   Socioeconomic History   Marital status: Single    Spouse name: Not on file   Number of children: Not on file   Years of education: Not on file   Highest education level: Not on file  Occupational History   Not on file  Tobacco Use   Smoking status: Never   Smokeless tobacco: Current  Types: Chew   Tobacco comments:    Chews 2x a day, start at 12  Vaping Use   Vaping status: Never Used  Substance and Sexual Activity   Alcohol use: No   Drug use: No   Sexual activity: Not Currently  Other Topics Concern   Not on file  Social History Narrative   Not on file   Social Determinants of Health   Financial Resource Strain: Not on file  Food Insecurity: Low Risk  (08/16/2022)   [facility]   Hunger Vital Sign    Worried About Running Out of Food in the Last Year: Never true    Ran Out of Food in the Last Year: Never true  Transportation Needs: No Transportation Needs (08/16/2022)    [facility]   Transportation    In the past 12 months, has lack of reliable transportation kept you from medical appointments, meetings, work or from getting things needed for daily living? : No  Physical Activity: Not on file  Stress: Not on file  Social Connections: Not on file    Current Medications:  Current Outpatient Medications:    acetaminophen (TYLENOL) 500 MG tablet, Take 1,000 mg by mouth every 6 (six) hours as needed for moderate pain., Disp: , Rfl:    apixaban (ELIQUIS) 5 MG TABS tablet, Take 1 tablet (5 mg total) by mouth 2 (two) times daily., Disp: 60 tablet, Rfl: 0   Coenzyme Q10 (COQ10) 100 MG CAPS, Take 100 mg by mouth 2 (two) times daily., Disp: , Rfl:    LINZESS 145 MCG CAPS capsule, Take 145 mcg by mouth daily., Disp: , Rfl:    losartan (COZAAR) 50 MG tablet, Take 50 mg by mouth daily., Disp: , Rfl:    rosuvastatin (CRESTOR) 20 MG tablet, Take 20 mg by mouth at bedtime., Disp: , Rfl:    vitamin B-12 (CYANOCOBALAMIN) 500 MCG tablet, Take 500 mcg by mouth daily., Disp: , Rfl:    White Petrolatum-Mineral Oil (EYE LUBRICANT OP), Place 2 drops into both eyes as needed (dry eye)., Disp: , Rfl:   Review of Systems: + chills, ringing in ears, constipation, dizziness Denies appetite changes, fevers, chills, fatigue, unexplained weight changes. Denies hearing loss, neck lumps or masses, mouth sores, ringing in ears or voice changes. Denies cough or wheezing.  Denies shortness of breath. Denies chest pain or palpitations. Denies leg swelling. Denies abdominal distention, pain, blood in stools, constipation, diarrhea, nausea, vomiting, or early satiety. Denies pain with intercourse, dysuria, frequency, hematuria or incontinence. Denies hot flashes, pelvic pain, vaginal bleeding or vaginal discharge.   Denies joint pain, back pain or muscle pain/cramps. Denies itching, rash, or wounds. Denies dizziness, headaches, numbness or seizures. Denies  swollen lymph nodes or glands, denies easy bruising or bleeding. Denies anxiety, depression, confusion, or decreased concentration.  Physical Exam: BP 110/77 (BP Location: Left Arm, Patient Position: Sitting) Comment: Notified Rn rechecked first was 141/114  Pulse 74   Temp 99 F (37.2 C) (Oral)   Resp 20   Wt 163 lb 6.4 oz (74.1 kg)   SpO2 100%   BMI 30.87 kg/m  General: Alert, oriented, no acute distress. HEENT: Normocephalic, atraumatic, sclera anicteric. Chest: Unlabored breathing on room air.  Laboratory & Radiologic Studies: None new  Assessment & Plan: Renee Payne is a 72 y.o. woman with an enlarging fundal uterine mass.   Patient presents today for treatment planning.  She has been cleared by cardiology  without further testing.  She will come off Eliquis 3 days before surgery and we will start as soon as safe after.  Typically I would plan on Lovenox for 2 days postoperatively before restarting prophylactic dosing of Eliquis until 5-7 days postoperatively at which time, as long as no bleeding, the patient the increase back to her therapeutic dosing.  My office will reach out to hematology to assure no recommendations for IVC filter preoperatively.  We discussed the plan for robotic surgery with a mini lap required due to size of fundal uterine mass.  Uterus will be sent for frozen section.  Given her comorbidities, if malignancy identified, decision will be made if safe and appropriate for staging indicated (such as lymph node sampling).  We reviewed the plan for a robotic assisted hysterectomy, bilateral salpingo-oophorectomy, mini laparotomy, possible staging. The risks of surgery were discussed in detail and she understands these to include infection; wound separation; hernia; vaginal cuff separation, injury to adjacent organs such as bowel, bladder, blood vessels, ureters and nerves; bleeding which may require blood transfusion; anesthesia risk; thromboembolic events;  possible death; unforeseen complications; possible need for re-exploration; medical complications such as heart attack, stroke, pleural effusion and pneumonia; and, if full lymphadenectomy is performed the risk of lymphedema and lymphocyst. The patient will receive DVT and antibiotic prophylaxis as indicated. She voiced a clear understanding. She had the opportunity to ask questions. Perioperative instructions were reviewed with her. Prescriptions for post-op medications were sent to her pharmacy of choice.  28 minutes of total time was spent for this patient encounter, including preparation, face-to-face counseling with the patient and coordination of care, and documentation of the encounter.  Eugene Garnet, MD  Division of Gynecologic Oncology  Department of Obstetrics and Gynecology  [facility]

## 2023-02-09 NOTE — Telephone Encounter (Signed)
Left a message for Dr. Milta Deiters nurse regarding the need for an IVC filter preop.  Requested a return call.

## 2023-02-10 ENCOUNTER — Inpatient Hospital Stay (HOSPITAL_BASED_OUTPATIENT_CLINIC_OR_DEPARTMENT_OTHER): Payer: Medicare HMO | Admitting: Gynecologic Oncology

## 2023-02-10 ENCOUNTER — Encounter (HOSPITAL_COMMUNITY)
Admission: RE | Admit: 2023-02-10 | Discharge: 2023-02-10 | Disposition: A | Discharge status: Home / Self Care | Payer: Medicare HMO | Source: Ambulatory Visit | Attending: Gynecologic Oncology | Admitting: Gynecologic Oncology

## 2023-02-10 ENCOUNTER — Telehealth: Payer: Self-pay | Admitting: *Deleted

## 2023-02-10 ENCOUNTER — Encounter (HOSPITAL_COMMUNITY): Payer: Self-pay

## 2023-02-10 ENCOUNTER — Encounter: Payer: Self-pay | Admitting: Gynecologic Oncology

## 2023-02-10 ENCOUNTER — Other Ambulatory Visit: Payer: Self-pay

## 2023-02-10 ENCOUNTER — Encounter: Payer: Self-pay | Admitting: Cardiology

## 2023-02-10 ENCOUNTER — Inpatient Hospital Stay: Payer: Medicare HMO | Attending: Gynecologic Oncology | Admitting: Gynecologic Oncology

## 2023-02-10 VITALS — BP 110/77 | HR 74 | Temp 99.0°F | Resp 20 | Wt 163.4 lb

## 2023-02-10 VITALS — BP 132/94 | HR 99 | Temp 98.6°F | Resp 18 | Ht 61.0 in | Wt 163.0 lb

## 2023-02-10 DIAGNOSIS — D390 Neoplasm of uncertain behavior of uterus: Secondary | ICD-10-CM | POA: Insufficient documentation

## 2023-02-10 DIAGNOSIS — N858 Other specified noninflammatory disorders of uterus: Secondary | ICD-10-CM

## 2023-02-10 DIAGNOSIS — N852 Hypertrophy of uterus: Secondary | ICD-10-CM | POA: Insufficient documentation

## 2023-02-10 DIAGNOSIS — D508 Other iron deficiency anemias: Secondary | ICD-10-CM

## 2023-02-10 DIAGNOSIS — E1122 Type 2 diabetes mellitus with diabetic chronic kidney disease: Secondary | ICD-10-CM | POA: Insufficient documentation

## 2023-02-10 DIAGNOSIS — I10 Essential (primary) hypertension: Secondary | ICD-10-CM

## 2023-02-10 DIAGNOSIS — N1832 Chronic kidney disease, stage 3b: Secondary | ICD-10-CM

## 2023-02-10 DIAGNOSIS — Z950 Presence of cardiac pacemaker: Secondary | ICD-10-CM | POA: Insufficient documentation

## 2023-02-10 DIAGNOSIS — I129 Hypertensive chronic kidney disease with stage 1 through stage 4 chronic kidney disease, or unspecified chronic kidney disease: Secondary | ICD-10-CM | POA: Insufficient documentation

## 2023-02-10 DIAGNOSIS — Z8673 Personal history of transient ischemic attack (TIA), and cerebral infarction without residual deficits: Secondary | ICD-10-CM

## 2023-02-10 DIAGNOSIS — Z01812 Encounter for preprocedural laboratory examination: Secondary | ICD-10-CM | POA: Insufficient documentation

## 2023-02-10 DIAGNOSIS — E119 Type 2 diabetes mellitus without complications: Secondary | ICD-10-CM

## 2023-02-10 DIAGNOSIS — M852 Hyperostosis of skull: Secondary | ICD-10-CM | POA: Insufficient documentation

## 2023-02-10 DIAGNOSIS — K5909 Other constipation: Secondary | ICD-10-CM

## 2023-02-10 DIAGNOSIS — Z7901 Long term (current) use of anticoagulants: Secondary | ICD-10-CM | POA: Diagnosis not present

## 2023-02-10 DIAGNOSIS — Z86711 Personal history of pulmonary embolism: Secondary | ICD-10-CM

## 2023-02-10 DIAGNOSIS — N189 Chronic kidney disease, unspecified: Secondary | ICD-10-CM | POA: Insufficient documentation

## 2023-02-10 HISTORY — DX: Chronic kidney disease, unspecified: N18.9

## 2023-02-10 LAB — COMPREHENSIVE METABOLIC PANEL
ALT: 21 U/L (ref 0–44)
AST: 16 U/L (ref 15–41)
Albumin: 3.5 g/dL (ref 3.5–5.0)
Alkaline Phosphatase: 34 U/L — ABNORMAL LOW (ref 38–126)
Anion gap: 9 (ref 5–15)
BUN: 32 mg/dL — ABNORMAL HIGH (ref 8–23)
CO2: 23 mmol/L (ref 22–32)
Calcium: 9.1 mg/dL (ref 8.9–10.3)
Chloride: 109 mmol/L (ref 98–111)
Creatinine, Ser: 2.08 mg/dL — ABNORMAL HIGH (ref 0.44–1.00)
GFR, Estimated: 25 mL/min — ABNORMAL LOW (ref >60)
Glucose, Bld: 85 mg/dL (ref 70–99)
Potassium: 3.9 mmol/L (ref 3.5–5.1)
Sodium: 141 mmol/L (ref 135–145)
Total Bilirubin: 0.8 mg/dL (ref 0.3–1.2)
Total Protein: 7.7 g/dL (ref 6.5–8.1)

## 2023-02-10 LAB — CBC
HCT: 34 % — ABNORMAL LOW (ref 36.0–46.0)
Hemoglobin: 9.9 g/dL — ABNORMAL LOW (ref 12.0–15.0)
MCH: 27.7 pg (ref 26.0–34.0)
MCHC: 29.1 g/dL — ABNORMAL LOW (ref 30.0–36.0)
MCV: 95.2 fL (ref 80.0–100.0)
Platelets: 185 10*3/uL (ref 150–400)
RBC: 3.57 MIL/uL — ABNORMAL LOW (ref 3.87–5.11)
RDW: 16.8 % — ABNORMAL HIGH (ref 11.5–15.5)
WBC: 2.5 10*3/uL — ABNORMAL LOW (ref 4.0–10.5)
nRBC: 0 % (ref 0.0–0.2)

## 2023-02-10 LAB — DIFFERENTIAL
Abs Immature Granulocytes: 0 10*3/uL (ref 0.00–0.07)
Basophils Absolute: 0 10*3/uL (ref 0.0–0.1)
Basophils Relative: 1 %
Eosinophils Absolute: 0.1 10*3/uL (ref 0.0–0.5)
Eosinophils Relative: 2 %
Immature Granulocytes: 0 %
Lymphocytes Relative: 26 %
Lymphs Abs: 0.7 10*3/uL (ref 0.7–4.0)
Monocytes Absolute: 0.3 10*3/uL (ref 0.1–1.0)
Monocytes Relative: 12 %
Neutro Abs: 1.5 10*3/uL — ABNORMAL LOW (ref 1.7–7.7)
Neutrophils Relative %: 59 %

## 2023-02-10 LAB — TYPE AND SCREEN
ABO/RH(D): O POS
Antibody Screen: NEGATIVE

## 2023-02-10 MED ORDER — TRAMADOL HCL 50 MG PO TABS
50.0000 mg | ORAL_TABLET | Freq: Four times a day (QID) | ORAL | 0 refills | Status: DC | PRN
Start: 2023-02-10 — End: 2023-02-17

## 2023-02-10 MED ORDER — POLYETHYLENE GLYCOL 3350 17 G PO PACK
17.0000 g | PACK | Freq: Every day | ORAL | 0 refills | Status: DC
Start: 2023-02-10 — End: 2023-03-07

## 2023-02-10 MED ORDER — SENNOSIDES-DOCUSATE SODIUM 8.6-50 MG PO TABS
2.0000 | ORAL_TABLET | Freq: Every day | ORAL | 0 refills | Status: DC
Start: 2023-02-10 — End: 2023-02-17

## 2023-02-10 NOTE — Progress Notes (Signed)
PERIOPERATIVE PRESCRIPTION FOR IMPLANTED CARDIAC DEVICE PROGRAMMING  Patient Information: Name:  Renee Payne  DOB:  1950/10/14  MRN:  [id number]  Planned Procedure:  XI ROBOTIC ASSISTED TOTAL HYSTERECTOMY WITH BILATERAL SALPINGO OOPHORECTOMY  Surgeon:  Pricilla Holm  Date of Procedure:  02/15/23  Cautery will be used.  Position during surgery:  unknown   Device Information:  Clinic EP Physician:  Steffanie Dunn, MD  Device Type:  Pacemaker Manufacturer and Phone #:  [employer]: [phone] Pacemaker Dependent?:  No. Date of Last Device Check:  01/09/2023 Normal Device Function?:  Yes.    Electrophysiologist's Recommendations:  Have magnet available. Provide continuous ECG monitoring when magnet is used or reprogramming is to be performed.  Procedure may interfere with device function.  Magnet should be placed over device during procedure.  Per Device Clinic Standing Orders, Wiliam Ke, RN  12:57 PM 02/10/2023

## 2023-02-10 NOTE — Progress Notes (Signed)
Patient here for follow up with her daughter with Dr. Pricilla Holm and for a pre-operative appointment prior to her scheduled surgery on 02/15/23. She is scheduled for robotic assisted total laparoscopic hysterectomy, bilateral salpingo-oophorectomy, possible staging if a precancer or cancer is seen, mini laparotomy, possible laparotomy .  She has her pre-admission testing appointment this am at [facility].  The surgery was discussed in detail.  See after visit summary for additional details.    Discussed post-op pain management in detail including the aspects of the enhanced recovery pathway.  Advised her that a new prescription would be sent in for tramadol and it is only to be used for after her upcoming surgery.  We discussed the use of tylenol post-op and to monitor for a maximum of 4,000 mg in a 24 hour period.  Also prescribed sennakot to be used after surgery and to hold if having loose stools.  Discussed bowel regimen in detail.     Discussed measures to take at home to prevent DVT including frequent mobility.  Reportable signs and symptoms of DVT discussed. Post-operative instructions discussed and expectations for after surgery. Incisional care discussed as well including reportable signs and symptoms including erythema, drainage, wound separation.     10 minutes spent preparing information and with the patient.  Verbalizing understanding of material discussed. No needs or concerns voiced at the end of the visit.   Advised patient to call for any needs.  Advised that her post-operative medications had been prescribed and could be picked up at any time.    This appointment is included in the global surgical bundle as pre-operative teaching and has no charge.

## 2023-02-10 NOTE — Progress Notes (Signed)
Received text from Surgeon's office about surgery time changed to 2pm. Called pt's daughter who was at PST visit with her mom. Instructed her of time change till 2pm, Arrive at hospital at 11:45am and last liquids at 11am. She verbalized understanding.

## 2023-02-10 NOTE — Progress Notes (Signed)
Please review pt's preop CBC and CMP results from 02/10/23.

## 2023-02-10 NOTE — Telephone Encounter (Signed)
2nd attempt to reach patient in regards to relaying message from provider about lab results. Left voicemail requesting call back.

## 2023-02-10 NOTE — Telephone Encounter (Signed)
Called Renee Payne in regards to the results of her recent blood work. Left message on both her home and cell phones requesting her to call the clinic to discuss her results.

## 2023-02-13 NOTE — Telephone Encounter (Signed)
-----   Message from Doylene Bode sent at 02/10/2023 11:58 AM EDT ----- Her WBC count is slightly low. We will recheck the morning of surgery. Please tell her to avoid large crowds, wear mask when out in, avoid sick people in order to prevent delaying surgery

## 2023-02-13 NOTE — Anesthesia Preprocedure Evaluation (Signed)
Anesthesia Evaluation  Patient identified by MRN, date of birth, ID band Patient awake    Reviewed: Allergy & Precautions, NPO status , Patient's Chart, lab work & pertinent test results  Airway Mallampati: II  TM Distance: >3 FB Neck ROM: Full    Dental  (+) Dental Advisory Given, Poor Dentition, Chipped, Missing   Pulmonary sleep apnea    Pulmonary exam normal breath sounds clear to auscultation       Cardiovascular hypertension, Pt. on medications + dysrhythmias + pacemaker  Rhythm:Regular Rate:Normal  Echo 06/2022  1. Patient is bradycardic in complete heart block during echo examination.   2. Left ventricular ejection fraction, by estimation, is 60 to 65%. The left ventricle has normal function. The left ventricle has no regional wall motion abnormalities. Left ventricular diastolic parameters are indeterminate.   3. Right ventricular systolic function is normal. The right ventricular size is normal. There is moderately elevated pulmonary artery systolic pressure.   4. Mild mitral valve regurgitation.   5. The aortic valve is tricuspid. Aortic valve regurgitation is trivial.   6. The inferior vena cava is normal in size with greater than 50% respiratory variability, suggesting right atrial pressure of 3 mmHg.      Neuro/Psych  PSYCHIATRIC DISORDERS Anxiety     CVA    GI/Hepatic negative GI ROS, Neg liver ROS,,,  Endo/Other  diabetes    Renal/GU Renal disease     Musculoskeletal negative musculoskeletal ROS (+)    Abdominal  (+) + obese  Peds  Hematology  (+) Blood dyscrasia, anemia   Anesthesia Other Findings   Reproductive/Obstetrics                              Anesthesia Physical Anesthesia Plan  ASA: 3  Anesthesia Plan: General   Post-op Pain Management: Tylenol PO (pre-op)*   Induction: Intravenous  PONV Risk Score and Plan: 4 or greater and Ondansetron, Dexamethasone and  Treatment may vary due to age or medical condition  Airway Management Planned: Oral ETT  Additional Equipment:   Intra-op Plan:   Post-operative Plan: Extubation in OR  Informed Consent: I have reviewed the patients History and Physical, chart, labs and discussed the procedure including the risks, benefits and alternatives for the proposed anesthesia with the patient or authorized representative who has indicated his/her understanding and acceptance.     Dental advisory given  Plan Discussed with: CRNA  Anesthesia Plan Comments: (2 x PIV    See PAT note 02/10/23)        Anesthesia Quick Evaluation

## 2023-02-13 NOTE — Telephone Encounter (Signed)
Spoke with Ms. Renee Payne and relayed message from [name], NP that her WBC count is slightly low. We will recheck the morning of surgery. Please tell her to avoid large crowds, wear a mask when out in public and avoid people who maybe sick in order to prevent delaying surgery. Also creatinine is slightly elevated and this has to do with kidney function, make sure you stay hydrated. Pt verbalized understanding and reminded patient that office would call tomorrow for her pre-op call.

## 2023-02-13 NOTE — Telephone Encounter (Signed)
Left another message for Dr. Welton Flakes regarding the need for an IVC filter pre op.  Requested a return call.

## 2023-02-13 NOTE — Progress Notes (Signed)
Anesthesia Chart Review   Case: [id number] Date/Time: 02/15/23 1345   Procedures:      XI ROBOTIC ASSISTED TOTAL HYSTERECTOMY WITH BILATERAL SALPINGO OOPHORECTOMY     MINI LAPAROTOMY FOR SPECIMEN DELIVERY AND POSSIBLE STAGING   Anesthesia type: General   Pre-op diagnosis: ENLARGING UTERINE MASS   Location: [facility] ROOM 05 / [facility]   Surgeons: Carver Fila, MD       DISCUSSION:72 y.o. never smoker with h/o HTN, OSA, DM II, CKD, PE, Stroke, CHB s/p pacemaker in place (device orders in 02/10/23 progress note), enlarging uterine mass scheduled for above procedure 02/15/23 with Dr. Eugene Garnet.   Pt last seen by cardiology 01/20/2023. Per OV note, "Pre-operative cardiac risk assessment: She has no signs or symptoms of angina, heart failure or arrhythmias. EKG today with paced rhythm. Her cardiac examination is normal. She can proceed with her planned surgical procedure without further cardiac workup."   VS: BP (!) 132/94   Pulse 99   Temp 37 C (Oral)   Resp 18   Ht 5\' 1"  (1.549 m)   Wt 73.9 kg   SpO2 100%   BMI 30.80 kg/m   PROVIDERS: [facility]  Verne Carrow, MD is Cardiologist  LABS: Labs reviewed: Acceptable for surgery. (all labs ordered are listed, but only abnormal results are displayed)  Labs Reviewed  CBC - Abnormal; Notable for the following components:      Result Value   WBC 2.5 (*)    RBC 3.57 (*)    Hemoglobin 9.9 (*)    HCT 34.0 (*)    MCHC 29.1 (*)    RDW 16.8 (*)    All other components within normal limits  COMPREHENSIVE METABOLIC PANEL - Abnormal; Notable for the following components:   BUN 32 (*)    Creatinine, Ser 2.08 (*)    Alkaline Phosphatase 34 (*)    GFR, Estimated 25 (*)    All other components within normal limits  TYPE AND SCREEN     IMAGES:   EKG:   CV: Echo 06/20/2022 1. Patient is bradycardic in complete heart block during echo  examination.   2. Left ventricular ejection fraction, by  estimation, is 60 to 65%. The  left ventricle has normal function. The left ventricle has no regional  wall motion abnormalities. Left ventricular diastolic parameters are  indeterminate.   3. Right ventricular systolic function is normal. The right ventricular  size is normal. There is moderately elevated pulmonary artery systolic  pressure.   4. Mild mitral valve regurgitation.   5. The aortic valve is tricuspid. Aortic valve regurgitation is trivial.   6. The inferior vena cava is normal in size with greater than 50%  respiratory variability, suggesting right atrial pressure of 3 mmHg.  Past Medical History:  Diagnosis Date   Chronic kidney disease    Diabetes mellitus without complication (HCC)    was on metformin but discontinued   Hyperlipidemia    Hypertension    Pacemaker 2024   Pulmonary emboli [facility])    patient doesn't know why , on lifelong blood thinner   Stroke [facility])     Past Surgical History:  Procedure Laterality Date   IVC FILTER INSERTION     PACEMAKER IMPLANT N/A 06/20/2022   Procedure: PACEMAKER IMPLANT - DUAL CHAMBER;  Surgeon: Lanier Prude, MD;  Location: [facility] INV[facility];  Service: Cardiovascular;  Laterality: N/A;   TUBAL LIGATION      MEDICATIONS:  acetaminophen (TYLENOL)  500 MG tablet   apixaban (ELIQUIS) 5 MG TABS tablet   Coenzyme Q10 (COQ10) 100 MG CAPS   LINZESS 145 MCG CAPS capsule   losartan (COZAAR) 50 MG tablet   polyethylene glycol (MIRALAX) 17 g packet   rosuvastatin (CRESTOR) 20 MG tablet   senna-docusate (SENOKOT-S) 8.6-50 MG tablet   traMADol (ULTRAM) 50 MG tablet   vitamin B-12 (CYANOCOBALAMIN) 500 MCG tablet   White Petrolatum-Mineral Oil (EYE LUBRICANT OP)   No current facility-administered medications for this encounter.     Jodell Cipro Ward, PA-C [facility] [phone]

## 2023-02-14 ENCOUNTER — Telehealth: Payer: Self-pay | Admitting: Gynecologic Oncology

## 2023-02-14 ENCOUNTER — Telehealth: Payer: Self-pay | Admitting: *Deleted

## 2023-02-14 NOTE — Telephone Encounter (Signed)
 Telephone call to check on pre-operative status.  Patient compliant with pre-operative instructions.  Reinforced nothing to eat after midnight. Clear liquids until 10:45. Patient to arrive at 11:45.  No questions or concerns voiced.  Instructed to call for any needs.

## 2023-02-14 NOTE — Telephone Encounter (Signed)
Late entry: received a call back 02/13/2023 from Dr. Jama Flavors recommending that patient be seen by one of the [facility] hematologists for surgical clearance and IVC filter need before surgery.

## 2023-02-14 NOTE — Telephone Encounter (Signed)
Patient's history reviewed with Dr. Artis Delay, Med Onc/Hematologist. Based on review, patient does not need to have an IVC filter prior to surgery and can hold her Eliquis as previously directed. Dr. Pricilla Holm informed of recommendations.   Spoke with the patient. Discussed above. Patient advised if she preferred we could have her meet with Dr. Bertis Ruddy in person and this appt would most likely be on November 12 with her surgery needing to be moved to November 13. The patient wants to get surgery completed as soon as possible and does not desire an in person visit. Continue with current plan for surgery tomorrow at [facility].

## 2023-02-15 ENCOUNTER — Ambulatory Visit (HOSPITAL_COMMUNITY): Payer: Medicare HMO | Admitting: Physician Assistant

## 2023-02-15 ENCOUNTER — Other Ambulatory Visit: Payer: Self-pay

## 2023-02-15 ENCOUNTER — Encounter (HOSPITAL_COMMUNITY)
Admission: RE | Disposition: A | Discharge status: Home / Self Care | Payer: Self-pay | Source: Home / Self Care | Attending: Gynecologic Oncology

## 2023-02-15 ENCOUNTER — Encounter (HOSPITAL_COMMUNITY): Payer: Self-pay | Admitting: Gynecologic Oncology

## 2023-02-15 ENCOUNTER — Observation Stay (HOSPITAL_COMMUNITY)
Admission: RE | Admit: 2023-02-15 | Discharge: 2023-02-17 | Disposition: A | Discharge status: Home / Self Care | Payer: Medicare HMO | Attending: Gynecologic Oncology | Admitting: Gynecologic Oncology

## 2023-02-15 ENCOUNTER — Ambulatory Visit (HOSPITAL_BASED_OUTPATIENT_CLINIC_OR_DEPARTMENT_OTHER): Payer: Medicare HMO | Admitting: Anesthesiology

## 2023-02-15 ENCOUNTER — Telehealth: Payer: Self-pay | Admitting: *Deleted

## 2023-02-15 DIAGNOSIS — Z79899 Other long term (current) drug therapy: Secondary | ICD-10-CM | POA: Diagnosis not present

## 2023-02-15 DIAGNOSIS — N858 Other specified noninflammatory disorders of uterus: Secondary | ICD-10-CM

## 2023-02-15 DIAGNOSIS — Z8673 Personal history of transient ischemic attack (TIA), and cerebral infarction without residual deficits: Secondary | ICD-10-CM | POA: Diagnosis not present

## 2023-02-15 DIAGNOSIS — Z86711 Personal history of pulmonary embolism: Secondary | ICD-10-CM | POA: Diagnosis not present

## 2023-02-15 DIAGNOSIS — N852 Hypertrophy of uterus: Secondary | ICD-10-CM | POA: Diagnosis present

## 2023-02-15 DIAGNOSIS — D709 Neutropenia, unspecified: Secondary | ICD-10-CM

## 2023-02-15 DIAGNOSIS — I129 Hypertensive chronic kidney disease with stage 1 through stage 4 chronic kidney disease, or unspecified chronic kidney disease: Secondary | ICD-10-CM | POA: Diagnosis not present

## 2023-02-15 DIAGNOSIS — I10 Essential (primary) hypertension: Secondary | ICD-10-CM | POA: Diagnosis not present

## 2023-02-15 DIAGNOSIS — E119 Type 2 diabetes mellitus without complications: Secondary | ICD-10-CM | POA: Insufficient documentation

## 2023-02-15 DIAGNOSIS — R1900 Intra-abdominal and pelvic swelling, mass and lump, unspecified site: Secondary | ICD-10-CM | POA: Diagnosis present

## 2023-02-15 DIAGNOSIS — Z7901 Long term (current) use of anticoagulants: Secondary | ICD-10-CM | POA: Diagnosis not present

## 2023-02-15 DIAGNOSIS — D269 Other benign neoplasm of uterus, unspecified: Secondary | ICD-10-CM | POA: Diagnosis not present

## 2023-02-15 DIAGNOSIS — N189 Chronic kidney disease, unspecified: Secondary | ICD-10-CM

## 2023-02-15 DIAGNOSIS — R141 Gas pain: Secondary | ICD-10-CM

## 2023-02-15 DIAGNOSIS — Z950 Presence of cardiac pacemaker: Secondary | ICD-10-CM | POA: Insufficient documentation

## 2023-02-15 DIAGNOSIS — K5909 Other constipation: Secondary | ICD-10-CM

## 2023-02-15 DIAGNOSIS — F17290 Nicotine dependence, other tobacco product, uncomplicated: Secondary | ICD-10-CM | POA: Diagnosis not present

## 2023-02-15 DIAGNOSIS — R7989 Other specified abnormal findings of blood chemistry: Secondary | ICD-10-CM

## 2023-02-15 HISTORY — PX: ROBOTIC ASSISTED TOTAL HYSTERECTOMY WITH BILATERAL SALPINGO OOPHERECTOMY: SHX6086

## 2023-02-15 HISTORY — PX: LAPAROTOMY WITH STAGING: SHX5938

## 2023-02-15 LAB — CBC WITH DIFFERENTIAL/PLATELET
Abs Immature Granulocytes: 0.01 10*3/uL (ref 0.00–0.07)
Basophils Absolute: 0 10*3/uL (ref 0.0–0.1)
Basophils Relative: 1 %
Eosinophils Absolute: 0 10*3/uL (ref 0.0–0.5)
Eosinophils Relative: 1 %
HCT: 35.7 % — ABNORMAL LOW (ref 36.0–46.0)
Hemoglobin: 10.6 g/dL — ABNORMAL LOW (ref 12.0–15.0)
Immature Granulocytes: 0 %
Lymphocytes Relative: 29 %
Lymphs Abs: 0.7 10*3/uL (ref 0.7–4.0)
MCH: 28 pg (ref 26.0–34.0)
MCHC: 29.7 g/dL — ABNORMAL LOW (ref 30.0–36.0)
MCV: 94.4 fL (ref 80.0–100.0)
Monocytes Absolute: 0.3 10*3/uL (ref 0.1–1.0)
Monocytes Relative: 12 %
Neutro Abs: 1.4 10*3/uL — ABNORMAL LOW (ref 1.7–7.7)
Neutrophils Relative %: 57 %
Platelets: 258 10*3/uL (ref 150–400)
RBC: 3.78 MIL/uL — ABNORMAL LOW (ref 3.87–5.11)
RDW: 16.4 % — ABNORMAL HIGH (ref 11.5–15.5)
WBC: 2.5 10*3/uL — ABNORMAL LOW (ref 4.0–10.5)
nRBC: 0 % (ref 0.0–0.2)

## 2023-02-15 LAB — GLUCOSE, CAPILLARY
Glucose-Capillary: 76 mg/dL (ref 70–99)
Glucose-Capillary: 89 mg/dL (ref 70–99)
Glucose-Capillary: 199 mg/dL — ABNORMAL HIGH (ref 70–99)

## 2023-02-15 LAB — HEMOGLOBIN A1C
Hgb A1c MFr Bld: 6.5 % — ABNORMAL HIGH (ref 4.8–5.6)
Mean Plasma Glucose: 139.85 mg/dL

## 2023-02-15 SURGERY — HYSTERECTOMY, TOTAL, ROBOT-ASSISTED, LAPAROSCOPIC, WITH BILATERAL SALPINGO-OOPHORECTOMY
Anesthesia: General | Site: Abdomen

## 2023-02-15 MED ORDER — GLYCOPYRROLATE 0.2 MG/ML IJ SOLN
INTRAMUSCULAR | Status: AC
Start: 2023-02-15 — End: ?
  Filled 2023-02-15: qty 1

## 2023-02-15 MED ORDER — BUPIVACAINE HCL 0.25 % IJ SOLN
INTRAMUSCULAR | Status: AC
  Filled 2023-02-15: qty 1

## 2023-02-15 MED ORDER — LACTATED RINGERS IV SOLN: INTRAVENOUS | Status: DC

## 2023-02-15 MED ORDER — PROPOFOL 10 MG/ML IV BOLUS
INTRAVENOUS | Status: DC | PRN
  Administered 2023-02-15: 20 mg via INTRAVENOUS

## 2023-02-15 MED ORDER — ONDANSETRON HCL 4 MG/2ML IJ SOLN
INTRAMUSCULAR | Status: AC
  Filled 2023-02-15: qty 4

## 2023-02-15 MED ORDER — TRAMADOL HCL 50 MG PO TABS
100.0000 mg | ORAL_TABLET | Freq: Two times a day (BID) | ORAL | Status: DC | PRN
  Administered 2023-02-15: 100 mg via ORAL
  Filled 2023-02-15: qty 2

## 2023-02-15 MED ORDER — DEXAMETHASONE SODIUM PHOSPHATE 10 MG/ML IJ SOLN
INTRAMUSCULAR | Status: AC
Start: 2023-02-15 — End: ?
  Filled 2023-02-15: qty 1

## 2023-02-15 MED ORDER — DROPERIDOL 2.5 MG/ML IJ SOLN: 0.6250 mg | Freq: Once | INTRAMUSCULAR | Status: DC | PRN

## 2023-02-15 MED ORDER — BUPIVACAINE LIPOSOME 1.3 % IJ SUSP
INTRAMUSCULAR | Status: DC | PRN
  Administered 2023-02-15: 20 mL

## 2023-02-15 MED ORDER — METRONIDAZOLE 500 MG/100ML IV SOLN
500.0000 mg | INTRAVENOUS | Status: AC
  Administered 2023-02-15: 500 mg via INTRAVENOUS
  Filled 2023-02-15: qty 100

## 2023-02-15 MED ORDER — FENTANYL CITRATE (PF) 100 MCG/2ML IJ SOLN
INTRAMUSCULAR | Status: DC | PRN
  Administered 2023-02-15: 100 ug via INTRAVENOUS

## 2023-02-15 MED ORDER — ROCURONIUM BROMIDE 10 MG/ML (PF) SYRINGE
PREFILLED_SYRINGE | INTRAVENOUS | Status: AC
  Filled 2023-02-15: qty 30

## 2023-02-15 MED ORDER — PHENYLEPHRINE HCL (PRESSORS) 10 MG/ML IV SOLN
INTRAVENOUS | Status: DC | PRN
  Administered 2023-02-15: 120 ug via INTRAVENOUS
  Administered 2023-02-15: 80 ug via INTRAVENOUS

## 2023-02-15 MED ORDER — ONDANSETRON HCL 4 MG/2ML IJ SOLN: 4.0000 mg | Freq: Four times a day (QID) | INTRAMUSCULAR | Status: DC | PRN

## 2023-02-15 MED ORDER — HEMOSTATIC AGENTS (NO CHARGE) OPTIME
TOPICAL | Status: DC | PRN
  Administered 2023-02-15: 1 via TOPICAL

## 2023-02-15 MED ORDER — ACETAMINOPHEN 500 MG PO TABS
1000.0000 mg | ORAL_TABLET | Freq: Two times a day (BID) | ORAL | Status: DC
  Administered 2023-02-16 – 2023-02-17 (×3): 1000 mg via ORAL
  Filled 2023-02-15 (×3): qty 2

## 2023-02-15 MED ORDER — DEXAMETHASONE SODIUM PHOSPHATE 4 MG/ML IJ SOLN
4.0000 mg | INTRAMUSCULAR | Status: AC
  Administered 2023-02-15: 5 mg via INTRAVENOUS

## 2023-02-15 MED ORDER — ONDANSETRON HCL 4 MG PO TABS: 4.0000 mg | ORAL_TABLET | Freq: Four times a day (QID) | ORAL | Status: DC | PRN

## 2023-02-15 MED ORDER — POVIDONE-IODINE 10 % EX SWAB: 2.0000 | Freq: Once | CUTANEOUS | Status: DC

## 2023-02-15 MED ORDER — EPHEDRINE 5 MG/ML INJ
INTRAVENOUS | Status: AC
  Filled 2023-02-15: qty 5

## 2023-02-15 MED ORDER — OXYCODONE HCL 5 MG PO TABS: 5.0000 mg | ORAL_TABLET | Freq: Once | ORAL | Status: DC | PRN

## 2023-02-15 MED ORDER — CHLORHEXIDINE GLUCONATE 0.12 % MT SOLN
15.0000 mL | Freq: Once | OROMUCOSAL | Status: AC
  Administered 2023-02-15: 15 mL via OROMUCOSAL

## 2023-02-15 MED ORDER — ORAL CARE MOUTH RINSE: 15.0000 mL | Freq: Once | OROMUCOSAL | Status: AC

## 2023-02-15 MED ORDER — INSULIN ASPART 100 UNIT/ML IJ SOLN
0.0000 [IU] | Freq: Three times a day (TID) | INTRAMUSCULAR | Status: DC
  Administered 2023-02-16: 1 [IU] via SUBCUTANEOUS
  Administered 2023-02-16: 2 [IU] via SUBCUTANEOUS

## 2023-02-15 MED ORDER — HYDROMORPHONE HCL 1 MG/ML IJ SOLN
INTRAMUSCULAR | Status: AC
  Filled 2023-02-15: qty 1

## 2023-02-15 MED ORDER — BUPIVACAINE HCL 0.25 % IJ SOLN
INTRAMUSCULAR | Status: DC | PRN
  Administered 2023-02-15: 25 mL

## 2023-02-15 MED ORDER — ROCURONIUM BROMIDE 100 MG/10ML IV SOLN
INTRAVENOUS | Status: DC | PRN
  Administered 2023-02-15: 70 mg via INTRAVENOUS

## 2023-02-15 MED ORDER — PHENYLEPHRINE 80 MCG/ML (10ML) SYRINGE FOR IV PUSH (FOR BLOOD PRESSURE SUPPORT)
PREFILLED_SYRINGE | INTRAVENOUS | Status: AC
  Filled 2023-02-15: qty 20

## 2023-02-15 MED ORDER — LIDOCAINE HCL (CARDIAC) PF 100 MG/5ML IV SOSY
PREFILLED_SYRINGE | INTRAVENOUS | Status: DC | PRN
  Administered 2023-02-15: 70 mg via INTRAVENOUS

## 2023-02-15 MED ORDER — LACTATED RINGERS IR SOLN
Status: DC | PRN
  Administered 2023-02-15: 1000 mL

## 2023-02-15 MED ORDER — SODIUM CHLORIDE 0.9% FLUSH
3.0000 mL | Freq: Two times a day (BID) | INTRAVENOUS | Status: DC
  Administered 2023-02-15 – 2023-02-17 (×4): 3 mL via INTRAVENOUS

## 2023-02-15 MED ORDER — HEPARIN SODIUM (PORCINE) 5000 UNIT/ML IJ SOLN
5000.0000 [IU] | INTRAMUSCULAR | Status: AC
  Administered 2023-02-15: 5000 [IU] via SUBCUTANEOUS
  Filled 2023-02-15: qty 1

## 2023-02-15 MED ORDER — SENNOSIDES-DOCUSATE SODIUM 8.6-50 MG PO TABS
2.0000 | ORAL_TABLET | Freq: Every day | ORAL | Status: DC
  Administered 2023-02-15 – 2023-02-16 (×2): 2 via ORAL
  Filled 2023-02-15 (×2): qty 2

## 2023-02-15 MED ORDER — SODIUM CHLORIDE (PF) 0.9 % IJ SOLN
INTRAMUSCULAR | Status: DC | PRN
  Administered 2023-02-15: 20 mL

## 2023-02-15 MED ORDER — PROPOFOL 10 MG/ML IV BOLUS
INTRAVENOUS | Status: AC
  Filled 2023-02-15: qty 20

## 2023-02-15 MED ORDER — OXYCODONE HCL 5 MG PO TABS
5.0000 mg | ORAL_TABLET | ORAL | Status: DC | PRN
  Administered 2023-02-16 (×2): 5 mg via ORAL
  Filled 2023-02-15 (×3): qty 1

## 2023-02-15 MED ORDER — SUGAMMADEX SODIUM 200 MG/2ML IV SOLN
INTRAVENOUS | Status: DC | PRN
  Administered 2023-02-15: 200 mg via INTRAVENOUS

## 2023-02-15 MED ORDER — ENOXAPARIN SODIUM 40 MG/0.4ML IJ SOSY
40.0000 mg | PREFILLED_SYRINGE | INTRAMUSCULAR | Status: DC
  Administered 2023-02-16: 40 mg via SUBCUTANEOUS
  Filled 2023-02-15: qty 0.4

## 2023-02-15 MED ORDER — OXYCODONE HCL 5 MG/5ML PO SOLN: 5.0000 mg | Freq: Once | ORAL | Status: DC | PRN

## 2023-02-15 MED ORDER — HYDROMORPHONE HCL 1 MG/ML IJ SOLN
0.2500 mg | INTRAMUSCULAR | Status: DC | PRN
  Administered 2023-02-15: 0.25 mg via INTRAVENOUS

## 2023-02-15 MED ORDER — BUPIVACAINE LIPOSOME 1.3 % IJ SUSP
INTRAMUSCULAR | Status: AC
  Filled 2023-02-15: qty 20

## 2023-02-15 MED ORDER — ROSUVASTATIN CALCIUM 10 MG PO TABS
20.0000 mg | ORAL_TABLET | Freq: Every day | ORAL | Status: DC
  Administered 2023-02-15 – 2023-02-16 (×2): 20 mg via ORAL
  Filled 2023-02-15 (×2): qty 2

## 2023-02-15 MED ORDER — SODIUM CHLORIDE 0.9% FLUSH
10.0000 mL | Freq: Two times a day (BID) | INTRAVENOUS | Status: DC
  Administered 2023-02-15 – 2023-02-17 (×4): 10 mL via INTRAVENOUS

## 2023-02-15 MED ORDER — HYDROMORPHONE HCL 1 MG/ML IJ SOLN
0.5000 mg | INTRAMUSCULAR | Status: DC | PRN
  Administered 2023-02-16: 0.5 mg via INTRAVENOUS
  Filled 2023-02-15: qty 0.5

## 2023-02-15 MED ORDER — FENTANYL CITRATE (PF) 100 MCG/2ML IJ SOLN
INTRAMUSCULAR | Status: AC
  Filled 2023-02-15: qty 2

## 2023-02-15 MED ORDER — ONDANSETRON HCL 4 MG/2ML IJ SOLN
INTRAMUSCULAR | Status: DC | PRN
  Administered 2023-02-15: 4 mg via INTRAVENOUS

## 2023-02-15 MED ORDER — LIDOCAINE HCL (PF) 2 % IJ SOLN
INTRAMUSCULAR | Status: AC
Start: 2023-02-15 — End: ?
  Filled 2023-02-15: qty 15

## 2023-02-15 MED ORDER — ACETAMINOPHEN 500 MG PO TABS
1000.0000 mg | ORAL_TABLET | ORAL | Status: AC
  Administered 2023-02-15: 1000 mg via ORAL
  Filled 2023-02-15: qty 2

## 2023-02-15 MED ORDER — CEFAZOLIN SODIUM-DEXTROSE 2-4 GM/100ML-% IV SOLN
2.0000 g | INTRAVENOUS | Status: AC
  Administered 2023-02-15: 2 g via INTRAVENOUS
  Filled 2023-02-15: qty 100

## 2023-02-15 MED ORDER — SODIUM CHLORIDE (PF) 0.9 % IJ SOLN
INTRAMUSCULAR | Status: AC
  Filled 2023-02-15: qty 20

## 2023-02-15 SURGICAL SUPPLY — 82 items
ADH SKN CLS APL DERMABOND .7 (GAUZE/BANDAGES/DRESSINGS) ×1
AGENT HMST KT MTR STRL THRMB (HEMOSTASIS)
APL ESCP 34 STRL LF DISP (HEMOSTASIS)
APL SRG 38 LTWT LNG FL B (MISCELLANEOUS) ×1
APPLICATOR ARISTA FLEXITIP XL (MISCELLANEOUS) IMPLANT
APPLICATOR SURGIFLO ENDO (HEMOSTASIS) IMPLANT
BAG COUNTER SPONGE SURGICOUNT (BAG) IMPLANT
BAG LAPAROSCOPIC 12 15 PORT 16 (BASKET) IMPLANT
BAG RETRIEVAL 12/15 (BASKET) ×1
BAG SPNG CNTER NS LX DISP (BAG)
BINDER ABDOMINAL 12 ML 46-62 (SOFTGOODS) IMPLANT
BLADE SURG SZ10 CARB STEEL (BLADE) IMPLANT
COVER BACK TABLE 60X90IN (DRAPES) ×1 IMPLANT
COVER TIP SHEARS 8 DVNC (MISCELLANEOUS) ×1 IMPLANT
DERMABOND ADVANCED .7 DNX12 (GAUZE/BANDAGES/DRESSINGS) ×1 IMPLANT
DRAPE ARM DVNC X/XI (DISPOSABLE) ×4 IMPLANT
DRAPE COLUMN DVNC XI (DISPOSABLE) ×1 IMPLANT
DRAPE SHEET LG 3/4 BI-LAMINATE (DRAPES) ×1 IMPLANT
DRAPE SURG IRRIG POUCH 19X23 (DRAPES) ×1 IMPLANT
DRIVER NDL MEGA SUTCUT DVNCXI (INSTRUMENTS) ×1 IMPLANT
DRIVER NDLE MEGA SUTCUT DVNCXI (INSTRUMENTS) ×1
DRSG OPSITE POSTOP 4X6 (GAUZE/BANDAGES/DRESSINGS) IMPLANT
DRSG OPSITE POSTOP 4X8 (GAUZE/BANDAGES/DRESSINGS) IMPLANT
ELECT PENCIL ROCKER SW 15FT (MISCELLANEOUS) IMPLANT
ELECT REM PT RETURN 15FT ADLT (MISCELLANEOUS) ×1 IMPLANT
FORCEPS BPLR FENES DVNC XI (FORCEP) ×1 IMPLANT
FORCEPS PROGRASP DVNC XI (FORCEP) ×1 IMPLANT
GAUZE 4X4 16PLY ~~LOC~~+RFID DBL (SPONGE) ×2 IMPLANT
GLOVE BIO SURGEON STRL SZ 6 (GLOVE) ×4 IMPLANT
GLOVE BIO SURGEON STRL SZ 6.5 (GLOVE) ×1 IMPLANT
GOWN STRL REUS W/ TWL LRG LVL3 (GOWN DISPOSABLE) ×4 IMPLANT
GOWN STRL REUS W/TWL LRG LVL3 (GOWN DISPOSABLE) ×4
GRASPER SUT TROCAR 14GX15 (MISCELLANEOUS) IMPLANT
HEMOSTAT ARISTA ABSORB 3G PWDR (HEMOSTASIS) IMPLANT
HOLDER FOLEY CATH W/STRAP (MISCELLANEOUS) IMPLANT
IRRIG SUCT STRYKERFLOW 2 WTIP (MISCELLANEOUS) ×1
IRRIGATION SUCT STRKRFLW 2 WTP (MISCELLANEOUS) ×1 IMPLANT
KIT PROCEDURE DVNC SI (MISCELLANEOUS) IMPLANT
KIT TURNOVER KIT A (KITS) IMPLANT
LIGASURE IMPACT 36 18CM CVD LR (INSTRUMENTS) IMPLANT
MANIPULATOR ADVINCU DEL 3.0 PL (MISCELLANEOUS) IMPLANT
MANIPULATOR ADVINCU DEL 3.5 PL (MISCELLANEOUS) IMPLANT
MANIPULATOR UTERINE 4.5 ZUMI (MISCELLANEOUS) IMPLANT
NDL HYPO 21X1.5 SAFETY (NEEDLE) ×1 IMPLANT
NDL SPNL 18GX3.5 QUINCKE PK (NEEDLE) IMPLANT
NEEDLE HYPO 21X1.5 SAFETY (NEEDLE) ×1
NEEDLE SPNL 18GX3.5 QUINCKE PK (NEEDLE)
OBTURATOR OPTICAL STND 8 DVNC (TROCAR) ×1
OBTURATOR OPTICALSTD 8 DVNC (TROCAR) ×1 IMPLANT
PACK ROBOT GYN CUSTOM WL (TRAY / TRAY PROCEDURE) ×1 IMPLANT
PAD POSITIONING PINK XL (MISCELLANEOUS) ×1 IMPLANT
PORT ACCESS TROCAR AIRSEAL 12 (TROCAR) IMPLANT
SCISSORS MNPLR CVD DVNC XI (INSTRUMENTS) ×1 IMPLANT
SCRUB CHG 4% DYNA-HEX 4OZ (MISCELLANEOUS) IMPLANT
SEAL UNIV 5-12 XI (MISCELLANEOUS) ×4 IMPLANT
SET TRI-LUMEN FLTR TB AIRSEAL (TUBING) ×1 IMPLANT
SPIKE FLUID TRANSFER (MISCELLANEOUS) ×1 IMPLANT
SPONGE T-LAP 18X18 ~~LOC~~+RFID (SPONGE) IMPLANT
SURGIFLO W/THROMBIN 8M KIT (HEMOSTASIS) IMPLANT
SUT MNCRL AB 4-0 PS2 18 (SUTURE) IMPLANT
SUT PDS AB 1 TP1 96 (SUTURE) IMPLANT
SUT STRATA PDS 0 30 CT-2.5 (SUTURE) IMPLANT
SUT V-LOC 180 0-0 GS22 (SUTURE) IMPLANT
SUT VIC AB 0 CT1 27 (SUTURE)
SUT VIC AB 0 CT1 27XBRD ANTBC (SUTURE) IMPLANT
SUT VIC AB 2-0 CT1 27 (SUTURE) ×2
SUT VIC AB 2-0 CT1 TAPERPNT 27 (SUTURE) IMPLANT
SUT VIC AB 4-0 PS2 18 (SUTURE) ×2 IMPLANT
SUT VICRYL 0 27 CT2 27 ABS (SUTURE) ×1 IMPLANT
SUT VLOC 180 0 9IN GS21 (SUTURE) IMPLANT
SYR 10ML LL (SYRINGE) IMPLANT
SYS BAG RETRIEVAL 10MM (BASKET)
SYS WOUND ALEXIS 18CM MED (MISCELLANEOUS)
SYSTEM BAG RETRIEVAL 10MM (BASKET) IMPLANT
SYSTEM WOUND ALEXIS 18CM MED (MISCELLANEOUS) IMPLANT
TOWEL OR NON WOVEN STRL DISP B (DISPOSABLE) IMPLANT
TRAP SPECIMEN MUCUS 40CC (MISCELLANEOUS) IMPLANT
TRAY FOLEY MTR SLVR 16FR STAT (SET/KITS/TRAYS/PACK) ×1 IMPLANT
TROCAR PORT AIRSEAL 5X120 (TROCAR) IMPLANT
UNDERPAD 30X36 HEAVY ABSORB (UNDERPADS AND DIAPERS) ×2 IMPLANT
WATER STERILE IRR 1000ML POUR (IV SOLUTION) ×1 IMPLANT
YANKAUER SUCT BULB TIP 10FT TU (MISCELLANEOUS) IMPLANT

## 2023-02-15 NOTE — Brief Op Note (Signed)
02/15/2023  4:32 PM  PATIENT:  Renee Payne  72 y.o. female  PRE-OPERATIVE DIAGNOSIS:  ENLARGING UTERINE MASS  POST-OPERATIVE DIAGNOSIS:  ENLARGING UTERINE MASS  PROCEDURE:  Procedure(s): XI ROBOTIC ASSISTED TOTAL HYSTERECTOMY WITH BILATERAL SALPINGO OOPHORECTOMY (N/A) MINI LAPAROTOMY FOR SPECIMEN DELIVERY (N/A)  SURGEON:  Surgeons and Role:    * Carver Fila, MD - Primary    * Antionette Char, MD - Assisting  ANESTHESIA:   general  EBL:  75 mL   BLOOD ADMINISTERED:none  DRAINS: none   LOCAL MEDICATIONS USED:  marcaine, exparel  SPECIMEN:  uterus, cervix, tubes and ovaries  DISPOSITION OF SPECIMEN:  PATHOLOGY  COUNTS:  YES  TOURNIQUET:  * No tourniquets in log *  DICTATION: .Dragon Dictation  PLAN OF CARE: Admit for overnight observation  PATIENT DISPOSITION:  PACU - hemodynamically stable.   Delay start of Pharmacological VTE agent (>24hrs) due to surgical blood loss or risk of bleeding: no

## 2023-02-15 NOTE — Anesthesia Procedure Notes (Signed)
Procedure Name: Intubation Date/Time: 02/15/2023 2:19 PM  Performed by: Nathen May, CRNAPre-anesthesia Checklist: Patient identified, Emergency Drugs available, Suction available and Patient being monitored Patient Re-evaluated:Patient Re-evaluated prior to induction Oxygen Delivery Method: Circle System Utilized Preoxygenation: Pre-oxygenation with 100% oxygen Induction Type: IV induction Ventilation: Mask ventilation without difficulty Laryngoscope Size: Mac and 3 Grade View: Grade II Tube type: Oral Tube size: 7.0 mm Number of attempts: 1 Airway Equipment and Method: Stylet and Oral airway Placement Confirmation: ETT inserted through vocal cords under direct vision, positive ETCO2 and breath sounds checked- equal and bilateral Tube secured with: Tape Dental Injury: Teeth and Oropharynx as per pre-operative assessment

## 2023-02-15 NOTE — Transfer of Care (Signed)
Immediate Anesthesia Transfer of Care Note  Patient: Renee Payne  Procedure(s) Performed: XI ROBOTIC ASSISTED TOTAL HYSTERECTOMY WITH BILATERAL SALPINGO OOPHORECTOMY (Abdomen) MINI LAPAROTOMY FOR SPECIMEN DELIVERY (Abdomen)  Patient Location: PACU  Anesthesia Type:General  Level of Consciousness: awake, oriented, and drowsy  Airway & Oxygen Therapy: Patient Spontanous Breathing and Patient connected to nasal cannula oxygen  Post-op Assessment: Report given to RN and Post -op Vital signs reviewed and stable  Post vital signs: Reviewed and stable  Last Vitals:  Vitals Value Taken Time  BP 157/97 02/15/23 1631  Temp 36.8 C 02/15/23 1630  Pulse    Resp 17 02/15/23 1634  SpO2    Vitals shown include unfiled device data.  Last Pain:  Vitals:   02/15/23 1233  TempSrc:   PainSc: 0-No pain         Complications: No notable events documented.

## 2023-02-15 NOTE — Discharge Instructions (Addendum)
AFTER SURGERY INSTRUCTIONS   Return to work: 4-6 weeks if applicable  Dr. Pricilla Holm wants you to being taking ELIQUIS 2.5 mg starting tomorrow, November 2. You will take this twice daily for the next 5 days. After 5 days, if you are not having vaginal bleeding, you can increase back to your normal dose of 5 mg twice daily.   You will have a white honeycomb dressing over your larger incision. This dressing can be removed 5 days after surgery and you do not need to reapply a new dressing. Once you remove the dressing, you will notice that you have the surgical glue (dermabond) on the incision and this will peel off on its own. You can get this dressing wet in the shower the days after surgery prior to removal on the 5th day.   Make sure you are monitoring for constipation. We do recommend you take something daily for the next several days, hold if having diarrhea. You can continue with your linzess or use another laxative if needed.   Activity: 1. Be up and out of the bed during the day.  Take a nap if needed.  You may walk up steps but be careful and use the hand rail.  Stair climbing will tire you more than you think, you may need to stop part way and rest.    2. No lifting or straining for 6 weeks over 10 pounds. No pushing, pulling, straining for 6 weeks.   3. No driving for around 1-61 days when the following criteria have been met: Do not drive if you are taking narcotic pain medicine and make sure that your reaction time has returned.    4. You can shower as soon as the next day after surgery. Shower daily.  Use your regular soap and water (not directly on the incision) and pat your incision(s) dry afterwards; don't rub.  No tub baths or submerging your body in water until cleared by your surgeon. If you have the soap that was given to you by pre-surgical testing that was used before surgery, you do not need to use it afterwards because this can irritate your incisions.    5. No sexual activity  and nothing in the vagina for 10 weeks.   6. You may experience a small amount of clear drainage from your incisions, which is normal.  If the drainage persists, increases, or changes color please call the office.   7. Do not use creams, lotions, or ointments such as neosporin on your incisions after surgery until advised by your surgeon because they can cause removal of the dermabond glue on your incisions.     8. You may experience vaginal spotting after surgery or when the stitches at the top of the vagina begin to dissolve.  The spotting is normal but if you experience heavy bleeding, call our office.   9. Take Tylenol or ibuprofen first for pain if you are able to take these medications and only use narcotic pain medication for severe pain not relieved by the Tylenol or Ibuprofen.  Monitor your Tylenol intake to a max of 4,000 mg in a 24 hour period. You can alternate these medications after surgery.   Diet: 1. Low sodium Heart Healthy Diet is recommended but you are cleared to resume your normal (before surgery) diet after your procedure.   2. It is safe to use a laxative, such as Miralax or Colace, if you have difficulty moving your bowels. You have been prescribed Sennakot-S to  take at bedtime every evening after surgery to keep bowel movements regular and to prevent constipation.     Wound Care: 1. Keep clean and dry.  Shower daily.   Reasons to call the Doctor: Fever - Oral temperature greater than 100.4 degrees Fahrenheit Foul-smelling vaginal discharge Difficulty urinating Nausea and vomiting Increased pain at the site of the incision that is unrelieved with pain medicine. Difficulty breathing with or without chest pain New calf pain especially if only on one side Sudden, continuing increased vaginal bleeding with or without clots.   Contacts: For questions or concerns you should contact:   Dr. Eugene Garnet at [phone]   [name], NP at [phone]   After  Hours: call [phone] and have the GYN Oncologist paged/contacted (after 5 pm or on the weekends). You will speak with an after hours RN and let he or she know you have had surgery.   Messages sent via mychart are for non-urgent matters and are not responded to after hours so for urgent needs, please call the after hours number.

## 2023-02-15 NOTE — Telephone Encounter (Signed)
Spoke with Renee Payne from Short Stay to remind that patient needs a CBC with diff this morning prior to her surgery. Advised that order was seen and will be released when patient arrives and note was place on chart.

## 2023-02-15 NOTE — Plan of Care (Signed)
 [facility] Tonsillectomy/Adenoidectomy, Postoperative PEDS care plan entered in error.

## 2023-02-15 NOTE — Interval H&P Note (Signed)
History and Physical Interval Note:  02/15/2023 1:47 PM  Renee Payne  has presented today for surgery, with the diagnosis of ENLARGING UTERINE MASS.  The various methods of treatment have been discussed with the patient and family. After consideration of risks, benefits and other options for treatment, the patient has consented to  Procedure(s): XI ROBOTIC ASSISTED TOTAL HYSTERECTOMY WITH BILATERAL SALPINGO OOPHORECTOMY (N/A) MINI LAPAROTOMY FOR SPECIMEN DELIVERY AND POSSIBLE STAGING (N/A) as a surgical intervention.  The patient's history has been reviewed, patient examined, no change in status, stable for surgery.  I have reviewed the patient's chart and labs.  Questions were answered to the patient's satisfaction.     Carver Fila

## 2023-02-15 NOTE — Op Note (Signed)
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   Anesthesia: GET  Urine Output: 75 cc  Operative Findings:  : On EUA, large mobile uterus spanning almost up to the umbilicus.  On intra-abdominal entry, normal upper abdominal survey.  Normal-appearing omentum, and large bowel.  Uterus is in total length with an approximately 10-12 cm bulbous mass arising from the fundus with hemosiderin deposits on the surface.  Normal-appearing bilateral fallopian tubes and ovaries.  No ascites.  Estimated Blood Loss:  75 cc      Total IV Fluids: see I&O flowsheet         Specimens: uterus, cervix, bilateral tubes and ovaries, pelvic washings         Complications:  None apparent; patient tolerated the procedure well.         Disposition: PACU - hemodynamically stable.  Procedure Details  The patient was seen in the Holding Room. The risks, benefits, complications, treatment options, and expected outcomes were discussed with the patient.  The patient concurred with the proposed plan, giving informed consent.  The site of surgery properly noted/marked. The patient was identified as Curator and the procedure verified as a Robotic-assisted hysterectomy with bilateral salpingo oophorectomy.   After induction of anesthesia, the patient was draped and prepped in the usual sterile manner. Patient was placed in supine position after anesthesia and draped and prepped in the usual sterile manner as follows: Her arms were  tucked to her side with all appropriate precautions.  The patient was secured to the bed using padding and tape across her chest.  The patient was placed in the semi-lithotomy position in Sheldon stirrups.  The perineum and vagina were prepped with CHG. The patient's abdomen was prepped with ChloraPrep and then she was draped after the prep had been allowed to dry for 3 minutes.  A Time Out was held and the above information confirmed.  The urethra was prepped with Betadine. Foley catheter was placed.  A sterile speculum was placed in the vagina.  The cervix was grasped with a single-tooth tenaculum. The cervix was dilated with Shawnie Pons dilators.  The ZUMI uterine manipulator with a medium colpotomizer ring was placed without difficulty.  A pneum occluder balloon was placed over the manipulator.  OG tube placement was confirmed and to suction.   Next, a 5 mm skin incision was made 1 cm below the subcostal margin in the midclavicular line.  The 5 mm Optiview port and scope was used for direct entry.  Opening pressure was under 10 mm CO2.  The abdomen was insufflated and the findings were noted as above.   At this point and all points during the procedure, the patient's intra-abdominal pressure did not exceed 15 mmHg. Next, an 12 mm skin incision was made superior to the umbilicus and 8 mm incisions were made in the right and left abdomen, 8 cm lateral to the supraumbilical incision.  A fourth arm, using an 8 mm trocar, was placed on the right.  The 5 mm assist trocar was exchanged for a 5 mm airsealport. All ports were placed under direct visualization.  The patient was placed in steep  Trendelenburg.  Bowel was folded away into the upper abdomen.  The robot was docked in the normal manner.  The right and left peritoneum were opened parallel to the IP ligament to open the retroperitoneal spaces bilaterally. The round ligaments were transected. The ureter was noted to be on the medial leaf of the broad ligament.  The  peritoneum above the ureter was incised and stretched and the infundibulopelvic ligament was skeletonized, cauterized and cut.    The posterior peritoneum was taken down to the level of the KOH ring.  The anterior peritoneum was also taken down.  The bladder flap was created to the level of the KOH ring.  The uterine artery on the right side was skeletonized, cauterized and cut in the normal manner.  A similar procedure was performed on the left.  The colpotomy was made and the uterus, cervix, bilateral ovaries and tubes were amputated and placed in an Endo Catch bag that was inserted through the 12 mm robotic supraumbilical trocar.  Pedicles were inspected and excellent hemostasis was achieved.    The colpotomy at the vaginal cuff was closed with 0 Vicryl in a figure-of-eight at each apex and 0 strata fix to close the midportion of the cuff in a running manner.  Irrigation was used and excellent hemostasis was achieved.  Intra-abdominal pressure was decreased to 5 mmHg with excellent hemostasis assured.  Given need to restart patient's anticoagulation, Arista was placed over the surgical beds.  At this point in the procedure was completed.  Robotic instruments were removed under direct visulaization.  The robot was undocked.   The supraumbilical trocar was removed and the incision extended 8-10 cm with a scalpel. The incision was carried down to and through the fascia, with the abdomen insufflated, using monopolar electrocautery. The peritoneal incision was extended under direct visualization. The endocatch bag with the uterine specimen was delivered through the incision. The incision was then closed with running #1 looped PDS tied in the midline. The subcutaneous tissue was irrigated and hemostasis achieved. Exparel was injected for local anesthesia. The subcutaneous tissue was closed with 2-0 Vicyrl in running fashion.   The subcuticular tissue of all incisions was closed with 4-0 Vicryl and the skin was  closed with 4-0 Monocryl in a subcuticular manner.  Dermabond was applied.    The vagina was swabbed with  minimal bleeding noted. Foley catheter was removed.  All sponge, lap and needle counts were correct x  3.   The patient was transferred to the recovery room in stable condition.  Eugene Garnet, MD

## 2023-02-15 NOTE — Anesthesia Postprocedure Evaluation (Signed)
Anesthesia Post Note  Patient: Renee Payne  Procedure(s) Performed: XI ROBOTIC ASSISTED TOTAL HYSTERECTOMY WITH BILATERAL SALPINGO OOPHORECTOMY (Abdomen) MINI LAPAROTOMY FOR SPECIMEN DELIVERY (Abdomen)     Patient location during evaluation: PACU Anesthesia Type: General Level of consciousness: sedated and patient cooperative Pain management: pain level controlled Vital Signs Assessment: post-procedure vital signs reviewed and stable Respiratory status: spontaneous breathing Cardiovascular status: stable Anesthetic complications: no   No notable events documented.  Last Vitals:  Vitals:   02/15/23 1745 02/15/23 1815  BP: 130/76 (!) 153/83  Pulse: 69 83  Resp: 12 18  Temp:  37.2 C  SpO2: 100% 99%    Last Pain:  Vitals:   02/15/23 1815  TempSrc: Oral  PainSc:                  Lewie Loron

## 2023-02-16 ENCOUNTER — Telehealth: Payer: Self-pay | Admitting: *Deleted

## 2023-02-16 ENCOUNTER — Encounter (HOSPITAL_COMMUNITY): Payer: Self-pay | Admitting: Gynecologic Oncology

## 2023-02-16 DIAGNOSIS — N852 Hypertrophy of uterus: Secondary | ICD-10-CM | POA: Diagnosis not present

## 2023-02-16 LAB — GLUCOSE, CAPILLARY
Glucose-Capillary: 170 mg/dL — ABNORMAL HIGH (ref 70–99)
Glucose-Capillary: 106 mg/dL — ABNORMAL HIGH (ref 70–99)
Glucose-Capillary: 131 mg/dL — ABNORMAL HIGH (ref 70–99)
Glucose-Capillary: 118 mg/dL — ABNORMAL HIGH (ref 70–99)

## 2023-02-16 LAB — BASIC METABOLIC PANEL
Anion gap: 7 (ref 5–15)
Anion gap: 6 (ref 5–15)
BUN: 34 mg/dL — ABNORMAL HIGH (ref 8–23)
BUN: 36 mg/dL — ABNORMAL HIGH (ref 8–23)
CO2: 20 mmol/L — ABNORMAL LOW (ref 22–32)
CO2: 22 mmol/L (ref 22–32)
Calcium: 9 mg/dL (ref 8.9–10.3)
Calcium: 9 mg/dL (ref 8.9–10.3)
Chloride: 108 mmol/L (ref 98–111)
Chloride: 108 mmol/L (ref 98–111)
Creatinine, Ser: 2.21 mg/dL — ABNORMAL HIGH (ref 0.44–1.00)
Creatinine, Ser: 2.28 mg/dL — ABNORMAL HIGH (ref 0.44–1.00)
GFR, Estimated: 23 mL/min — ABNORMAL LOW (ref >60)
GFR, Estimated: 22 mL/min — ABNORMAL LOW (ref >60)
Glucose, Bld: 147 mg/dL — ABNORMAL HIGH (ref 70–99)
Glucose, Bld: 100 mg/dL — ABNORMAL HIGH (ref 70–99)
Potassium: 5.1 mmol/L (ref 3.5–5.1)
Potassium: 5.3 mmol/L — ABNORMAL HIGH (ref 3.5–5.1)
Sodium: 135 mmol/L (ref 135–145)
Sodium: 136 mmol/L (ref 135–145)

## 2023-02-16 LAB — CBC
HCT: 32.2 % — ABNORMAL LOW (ref 36.0–46.0)
Hemoglobin: 9.7 g/dL — ABNORMAL LOW (ref 12.0–15.0)
MCH: 28.2 pg (ref 26.0–34.0)
MCHC: 30.1 g/dL (ref 30.0–36.0)
MCV: 93.6 fL (ref 80.0–100.0)
Platelets: 251 10*3/uL (ref 150–400)
RBC: 3.44 MIL/uL — ABNORMAL LOW (ref 3.87–5.11)
RDW: 16.3 % — ABNORMAL HIGH (ref 11.5–15.5)
WBC: 4.5 10*3/uL (ref 4.0–10.5)
nRBC: 0 % (ref 0.0–0.2)

## 2023-02-16 MED ORDER — ENOXAPARIN SODIUM 30 MG/0.3ML IJ SOSY
30.0000 mg | PREFILLED_SYRINGE | INTRAMUSCULAR | Status: DC
Start: 2023-02-17 — End: 2023-02-17
  Administered 2023-02-17: 30 mg via SUBCUTANEOUS
  Filled 2023-02-16: qty 0.3

## 2023-02-16 MED ORDER — SODIUM CHLORIDE 0.9 % IV SOLN
INTRAVENOUS | Status: DC
  Administered 2023-02-16: 50 mL/h via INTRAVENOUS

## 2023-02-16 NOTE — Progress Notes (Signed)
   02/16/23 0925  TOC Brief Assessment  Insurance and Status Reviewed  Patient has primary care physician Yes  Home environment has been reviewed Single family home  Prior level of function: Independent  Prior/Current Home Services No current home services  Social Determinants of Health Reivew SDOH reviewed no interventions necessary  Readmission risk has been reviewed Yes  Transition of care needs no transition of care needs at this time

## 2023-02-16 NOTE — Progress Notes (Signed)
GYN Oncology Progress Note  Given increasing creatinine level, plan to initiate IVF and continue to monitor with repeat labs in the am. Continue plan of care per Dr. Pricilla Holm.

## 2023-02-16 NOTE — Plan of Care (Signed)
  Problem: Education: Goal: Knowledge of cardiac device and self-care will improve Outcome: Progressing Goal: Ability to safely manage health related needs after discharge will improve Outcome: Progressing   Problem: Education: Goal: Knowledge of General Education information will improve Description: Including pain rating scale, medication(s)/side effects and non-pharmacologic comfort measures Outcome: Progressing   Problem: Health Behavior/Discharge Planning: Goal: Ability to manage health-related needs will improve Outcome: Progressing   Problem: Clinical Measurements: Goal: Will remain free from infection Outcome: Progressing   Problem: Activity: Goal: Risk for activity intolerance will decrease Outcome: Progressing   Problem: Nutrition: Goal: Adequate nutrition will be maintained Outcome: Progressing   Problem: Coping: Goal: Level of anxiety will decrease Outcome: Progressing   Problem: Elimination: Goal: Will not experience complications related to bowel motility Outcome: Progressing Goal: Will not experience complications related to urinary retention Outcome: Progressing   Problem: Pain Management: Goal: General experience of comfort will improve Outcome: Progressing   Problem: Safety: Goal: Ability to remain free from injury will improve Outcome: Progressing   Problem: Skin Integrity: Goal: Risk for impaired skin integrity will decrease Outcome: Progressing

## 2023-02-16 NOTE — Progress Notes (Signed)
1 Day Post-Op Procedure(s) (LRB): XI ROBOTIC ASSISTED TOTAL HYSTERECTOMY WITH BILATERAL SALPINGO OOPHORECTOMY (N/A) MINI LAPAROTOMY FOR SPECIMEN DELIVERY (N/A)  Subjective: Patient reports voiding once, unmeasured. Had some chips early this am. No nausea. Walked to the bathroom once overnight. Sat in chair for a period to time. No chest pain, dyspnea.   Objective: Vital signs in last 24 hours: Temp:  [97.7 F (36.5 C)-99 F (37.2 C)] 98.3 F (36.8 C) (10/31 0550) Pulse Rate:  [67-95] 73 (10/31 0550) Resp:  [12-20] 18 (10/31 0550) BP: (96-159)/(73-97) 158/85 (10/31 0550) SpO2:  [92 %-100 %] 96 % (10/31 0550) Weight:  [162 lb (73.5 kg)-163 lb (73.9 kg)] 162 lb (73.5 kg) (10/30 1815)    Intake/Output from previous day: 10/30 0701 - 10/31 0700 In: 800 [I.V.:800] Out: 125 [Urine:50; Blood:75]  Physical Examination: General: alert, cooperative, and no distress Resp: clear to auscultation bilaterally Cardio: regular rate and rhythm, S1, S2 normal, no murmur, click, rub or gallop GI: incision: lap sites to the abdomen with dermabond intact without active drainage and abdomen is soft, mildly hypo bowel sounds Extremities: extremities normal, atraumatic, no cyanosis or edema  Labs: WBC/Hgb/Hct/Plts:  4.5/9.7/32.2/251 (10/31 0515) BUN/Cr/glu/ALT/AST/amyl/lip:  34/2.21/--/--/--/--/-- (10/31 0515)  Assessment: 72 y.o. s/p Procedure(s): XI ROBOTIC ASSISTED TOTAL HYSTERECTOMY WITH BILATERAL SALPINGO OOPHORECTOMY MINI LAPAROTOMY FOR SPECIMEN DELIVERY: stable Pain:  Pain is well-controlled on PRN medications.  Heme: Hgb 9.7 and Hct 32.2 this am.   ID: WBC 4.5. Given decadron intra-op. No signs of infection.  CV: BP and HR stable. Continue to monitor while inpt.  GI:  Tolerating po: Yes. Antiemetics ordered if needed.  GU: Creatinine 2.21 this am. Pt reports one void overnight.     FEN: No critical values this am. Plan for repeat Bmet to follow up on creatinine, decreased  output  Endo: Diabetes mellitus Type II, under good control.  CBG: CBG (last 3)  Recent Labs    02/15/23 1656 02/15/23 2132 02/16/23 0736  GLUCAP 89 199* 170*     Prophylaxis: SCD wraps on with no machine in the room. Lovenox ordered.  Plan: Patient needs IS, SCD machine in the room, measuring device to capture urine output Plan for repeat labs after lunch Plan for probable discharge home this afternoon if meeting milestones   LOS: 0 days    Renee Payne 02/16/2023, 7:14 AM

## 2023-02-16 NOTE — Telephone Encounter (Signed)
Spoke with Renee Payne pt's daughter and relayed message from Renee Mccreedy, NP that they will be keeping Renee Payne overnight and checking her labs in the morning due to her creatinine being slightly elevated and giving her IV fluids. Renee Payne verbalized understanding and voiced concerns about her mother's blood pressure medicine and wanted to make sure that she would be receiving it while in the hospital. Renee Payne that her concern would be relayed to providers and thanked the office for calling.

## 2023-02-17 ENCOUNTER — Telehealth: Payer: Self-pay | Admitting: *Deleted

## 2023-02-17 ENCOUNTER — Other Ambulatory Visit: Payer: Self-pay

## 2023-02-17 DIAGNOSIS — N852 Hypertrophy of uterus: Secondary | ICD-10-CM | POA: Diagnosis not present

## 2023-02-17 LAB — BASIC METABOLIC PANEL
Anion gap: 5 (ref 5–15)
BUN: 40 mg/dL — ABNORMAL HIGH (ref 8–23)
CO2: 23 mmol/L (ref 22–32)
Calcium: 8.8 mg/dL — ABNORMAL LOW (ref 8.9–10.3)
Chloride: 109 mmol/L (ref 98–111)
Creatinine, Ser: 2.11 mg/dL — ABNORMAL HIGH (ref 0.44–1.00)
GFR, Estimated: 24 mL/min — ABNORMAL LOW (ref >60)
Glucose, Bld: 111 mg/dL — ABNORMAL HIGH (ref 70–99)
Potassium: 4 mmol/L (ref 3.5–5.1)
Sodium: 137 mmol/L (ref 135–145)

## 2023-02-17 LAB — CBC
HCT: 29.6 % — ABNORMAL LOW (ref 36.0–46.0)
Hemoglobin: 8.9 g/dL — ABNORMAL LOW (ref 12.0–15.0)
MCH: 28.3 pg (ref 26.0–34.0)
MCHC: 30.1 g/dL (ref 30.0–36.0)
MCV: 94 fL (ref 80.0–100.0)
Platelets: 270 10*3/uL (ref 150–400)
RBC: 3.15 MIL/uL — ABNORMAL LOW (ref 3.87–5.11)
RDW: 16.4 % — ABNORMAL HIGH (ref 11.5–15.5)
WBC: 4.9 10*3/uL (ref 4.0–10.5)
nRBC: 0 % (ref 0.0–0.2)

## 2023-02-17 LAB — SURGICAL PATHOLOGY

## 2023-02-17 LAB — GLUCOSE, CAPILLARY
Glucose-Capillary: 61 mg/dL — ABNORMAL LOW (ref 70–99)
Glucose-Capillary: 95 mg/dL (ref 70–99)
Glucose-Capillary: 90 mg/dL (ref 70–99)

## 2023-02-17 MED ORDER — TRAMADOL HCL 50 MG PO TABS: 50.0000 mg | ORAL_TABLET | Freq: Two times a day (BID) | ORAL | Status: DC | PRN

## 2023-02-17 MED ORDER — SIMETHICONE 80 MG PO CHEW
80.0000 mg | CHEWABLE_TABLET | Freq: Four times a day (QID) | ORAL | Status: DC
Start: 2023-02-17 — End: 2023-02-17
  Administered 2023-02-17 (×2): 80 mg via ORAL
  Filled 2023-02-17 (×2): qty 1

## 2023-02-17 MED ORDER — APIXABAN 2.5 MG PO TABS: 2.5000 mg | ORAL_TABLET | Freq: Two times a day (BID) | ORAL | 0 refills | Status: DC

## 2023-02-17 NOTE — Plan of Care (Signed)

## 2023-02-17 NOTE — Telephone Encounter (Signed)
-----   Message from Doylene Bode sent at 02/17/2023  2:44 PM EDT ----- Please call the daughter and let her know the recommendations for her Eliquis starting tomorrow and about the dressing that should come off 5 days from surgery. Thanks

## 2023-02-17 NOTE — Telephone Encounter (Signed)
Spoke with Renee Payne pt's daughter and relayed message from Renee Mccreedy, NP that Dr.Tucker wants Renee Payne to begin taking her ELIQUIS 2.5 mg starting tomorrow, November 2nd. She will take this twice daily for the next 5 days. After 5 days, if not having vaginal bleeding, it's ok to increase back to normal dose of 5 mg twice daily.   The honeycomb dressing on larger abd. Incision can be removed 5 days after surgery in the shower and you don't need to replace this. Once the dressing is removed, the dermabound on the incision will dissolve and peel off on it's own.   Make sure to monitor for constipation and take something daily for the next several days, hold if having diarrhea.  Ok to continue linzess or use other laxative if needed.   Also advised to monitor blood pressure and Renee Payne confirmed her mother has a blood pressure monitor at home.   Oneita Kras that the above instructions will be on her mother's discharge papers.  Renee Payne verbalized understanding and thanked the office for calling.

## 2023-02-17 NOTE — Discharge Summary (Signed)
Physician Discharge Summary  Patient ID: Renee Payne MRN: [id number] DOB/AGE: Oct 19, 1950 72 y.o.  Admit date: 02/15/2023 Discharge date: 02/17/2023  Admission Diagnoses: Uterine mass  Discharge Diagnoses:  Principal Problem:   Uterine mass Active Problems:   Pelvic mass in female   Discharged Condition:  The patient is in good condition and stable for discharge.    Hospital Course: On 02/15/2023, the patient underwent the following: Procedure(s): XI ROBOTIC ASSISTED TOTAL HYSTERECTOMY WITH BILATERAL SALPINGO OOPHORECTOMY MINI LAPAROTOMY FOR SPECIMEN DELIVERY. The postoperative course was uneventful except for slight elevation in baseline creatinine that resolved with IVF treatment.  She was discharged to home on postoperative day 2 tolerating a regular diet, ambulating, passing flatus, pain controlled with oral mediations. She is being discharged home on 2.5 mg of Eliquis BID for 5 days then she can resume her usual dose of 5 mg BID if no vaginal bleeding.  Consults: None  Significant Diagnostic Studies: Labs  Treatments: Surgery: see above, IVF  Discharge Exam (from am assessment): Blood pressure 127/66, pulse 86, temperature 98.8 F (37.1 C), resp. rate 18, height 5\' 3"  (1.6 m), weight 162 lb (73.5 kg), SpO2 98%. General: alert, cooperative, and no distress Resp: clear to auscultation bilaterally Cardio: regular rate and rhythm, S1, S2 normal, no murmur, click, rub or gallop GI: incision: lap sites to the abdomen with dermabond intact without active drainage and abdomen is soft, hypo bowel sounds Extremities: extremities normal, atraumatic, no cyanosis or edema  Disposition: Discharge disposition: 01-Home or Self Care       Discharge Instructions     Call MD for:  difficulty breathing, headache or visual disturbances   Complete by: As directed    Call MD for:  extreme fatigue   Complete by: As directed    Call MD for:  hives   Complete by: As directed     Call MD for:  persistant dizziness or light-headedness   Complete by: As directed    Call MD for:  persistant nausea and vomiting   Complete by: As directed    Call MD for:  redness, tenderness, or signs of infection (pain, swelling, redness, odor or green/yellow discharge around incision site)   Complete by: As directed    Call MD for:  severe uncontrolled pain   Complete by: As directed    Call MD for:  temperature >100.4   Complete by: As directed    Diet - low sodium heart healthy   Complete by: As directed    Discharge wound care:   Complete by: As directed    You will have a white honeycomb dressing over your larger incision. This dressing can be removed 5 days after surgery and you do not need to reapply a new dressing. Once you remove the dressing, you will notice that you have the surgical glue (dermabond) on the incision and this will peel off on its own. You can get this dressing wet in the shower the days after surgery prior to removal on the 5th day.   Driving Restrictions   Complete by: As directed    No driving for around 6-21 days based on when the following criteria has been met: Do not take narcotics and drive. You need to make sure your reaction time has returned.   Increase activity slowly   Complete by: As directed    Lifting restrictions   Complete by: As directed    No lifting greater than 10 lbs, pushing, pulling, straining for 6 weeks.  Sexual Activity Restrictions   Complete by: As directed    No sexual activity, nothing in the vagina, for 10 weeks.      Allergies as of 02/17/2023       Reactions   Ativan [lorazepam] Other (See Comments)   COMBATIVE AND EXTREMELY VIOLENT   Omeprazole Other (See Comments)   The front of her body felt very warm after one dose of this med.        Medication List     STOP taking these medications    senna-docusate 8.6-50 MG tablet Commonly known as: Senokot-S       TAKE these medications    acetaminophen 500  MG tablet Commonly known as: TYLENOL Take 1,000 mg by mouth every 6 (six) hours as needed for moderate pain.   apixaban 2.5 MG Tabs tablet Commonly known as: Eliquis Take 1 tablet (2.5 mg total) by mouth 2 (two) times daily. Start taking on: February 18, 2023 What changed:  medication strength how much to take   CoQ10 100 MG Caps Take 100 mg by mouth 2 (two) times daily.   EYE LUBRICANT OP Place 2 drops into both eyes as needed (dry eye).   Linzess 145 MCG Caps capsule Generic drug: linaclotide Take 145 mcg by mouth daily.   losartan 50 MG tablet Commonly known as: COZAAR Take 50 mg by mouth daily.   polyethylene glycol 17 g packet Commonly known as: MiraLax Take 17 g by mouth daily.   rosuvastatin 20 MG tablet Commonly known as: CRESTOR Take 20 mg by mouth at bedtime.   traMADol 50 MG tablet Commonly known as: ULTRAM Take 1 tablet (50 mg total) by mouth every 12 (twelve) hours as needed for severe pain (pain score 7-10). For AFTER surgery only, do not take and drive What changed: when to take this   vitamin B-12 500 MCG tablet Commonly known as: CYANOCOBALAMIN Take 500 mcg by mouth daily.               Discharge Care Instructions  (From admission, onward)           Start     Ordered   02/17/23 0000  Discharge wound care:       Comments: You will have a white honeycomb dressing over your larger incision. This dressing can be removed 5 days after surgery and you do not need to reapply a new dressing. Once you remove the dressing, you will notice that you have the surgical glue (dermabond) on the incision and this will peel off on its own. You can get this dressing wet in the shower the days after surgery prior to removal on the 5th day.   02/17/23 1427            Follow-up Information     Carver Fila, MD Follow up on 02/22/2023.   Specialty: Gynecologic Oncology Why: around 4:20pm will be a PHONE visit to check in and discuss pathology. IN  PERSON visit will be on 03/10/23 at 1pm at the [facility] information: [address] [place] [address] [phone]                 Greater than thirty minutes were spend for face to face discharge instructions and discharge orders/summary in EPIC.   Signed: Doylene Bode 02/17/2023, 2:35 PM

## 2023-02-17 NOTE — Progress Notes (Signed)
2 Days Post-Op Procedure(s) (LRB): XI ROBOTIC ASSISTED TOTAL HYSTERECTOMY WITH BILATERAL SALPINGO OOPHORECTOMY (N/A) MINI LAPAROTOMY FOR SPECIMEN DELIVERY (N/A)  Subjective: Patient is having moderate abdominal gas pains. No significant amount of flatus passed, no BM. Patient reports voiding three times last pm. No nausea this am but had one episode yesterday that resolved quickly. Walked in the halls. Pain controlled with oral medications. Denies chest pain, dyspnea.    Objective: Vital signs in last 24 hours: Temp:  [97.7 F (36.5 C)-98.4 F (36.9 C)] 97.8 F (36.6 C) (11/01 0448) Pulse Rate:  [60-89] 77 (11/01 0555) Resp:  [18] 18 (11/01 0448) BP: (144-179)/(79-95) 144/79 (11/01 0555) SpO2:  [95 %-97 %] 97 % (11/01 0448)    Intake/Output from previous day: 10/31 0701 - 11/01 0700 In: 631.8 [I.V.:631.8] Out: 900 [Urine:900]  Physical Examination: General: alert, cooperative, and no distress Resp: clear to auscultation bilaterally Cardio: regular rate and rhythm, S1, S2 normal, no murmur, click, rub or gallop GI: incision: lap sites to the abdomen with dermabond intact without active drainage and abdomen is soft, hypo bowel sounds Extremities: extremities normal, atraumatic, no cyanosis or edema  Labs: WBC/Hgb/Hct/Plts:  4.9/8.9/29.6/270 (11/01 0545) BUN/Cr/glu/ALT/AST/amyl/lip:  40/2.11/--/--/--/--/-- (11/01 0545)  Assessment: 72 y.o. s/p Procedure(s): XI ROBOTIC ASSISTED TOTAL HYSTERECTOMY WITH BILATERAL SALPINGO OOPHORECTOMY MINI LAPAROTOMY FOR SPECIMEN DELIVERY: stable Pain:  Pain is well-controlled on PRN medications.  Heme: Hgb 8.9 from 9.7 and Hct 29.6 from 32.2 yesterday am. Given IVF overnight. Overall stable.   ID: WBC 4.5. Given decadron intra-op. No signs of infection.  CV: BP and HR stable. Continue to monitor while inpt.  GI:  Tolerating po: Yes. Antiemetics ordered if needed.  GU: Creatinine 2.11 this am from 2.21. Pt reports three voids overnight.      FEN: No critical values this am.   Endo: Diabetes mellitus Type II, under good control.  CBG: CBG (last 3)  Recent Labs    02/16/23 1131 02/16/23 1713 02/16/23 2136  GLUCAP 106* 131* 118*     Prophylaxis: SCD. Lovenox ordered.  Plan: Simethicone ordered. Having moderate to severe gas pains. Encouraged increasing mobility If meeting milestones with pain improvement, probable discharge later today   LOS: 0 days    Renee Payne 02/17/2023, 7:32 AM

## 2023-02-20 ENCOUNTER — Telehealth: Payer: Self-pay | Admitting: *Deleted

## 2023-02-20 NOTE — Telephone Encounter (Signed)
Spoke with Renee Payne this morning. She states she is eating, drinking and urinating well. She has had a BM and is passing gas. She is taking her linzess as prescribed and encouraged her to drink plenty of water. She denies fever or chills. Incisions are dry and intact. She rates her pain 7/10. Her pain is controlled with tramadol then pain is 4/10.    Instructed to call office with any fever, chills, purulent drainage, uncontrolled pain or any other questions or concerns. Patient verbalizes understanding.   Pt aware of post op appointments as well as the office number [phone] and after hours number [phone] to call if she has any questions or concerns

## 2023-02-22 ENCOUNTER — Encounter: Payer: Self-pay | Admitting: Gynecologic Oncology

## 2023-02-22 ENCOUNTER — Inpatient Hospital Stay: Payer: Medicare HMO | Attending: Gynecologic Oncology | Admitting: Gynecologic Oncology

## 2023-02-22 DIAGNOSIS — N858 Other specified noninflammatory disorders of uterus: Secondary | ICD-10-CM

## 2023-02-22 DIAGNOSIS — D219 Benign neoplasm of connective and other soft tissue, unspecified: Secondary | ICD-10-CM | POA: Insufficient documentation

## 2023-02-22 DIAGNOSIS — Z7189 Other specified counseling: Secondary | ICD-10-CM

## 2023-02-22 DIAGNOSIS — Z90722 Acquired absence of ovaries, bilateral: Secondary | ICD-10-CM

## 2023-02-22 DIAGNOSIS — D72819 Decreased white blood cell count, unspecified: Secondary | ICD-10-CM | POA: Insufficient documentation

## 2023-02-22 DIAGNOSIS — Z90710 Acquired absence of both cervix and uterus: Secondary | ICD-10-CM

## 2023-02-22 DIAGNOSIS — D472 Monoclonal gammopathy: Secondary | ICD-10-CM | POA: Insufficient documentation

## 2023-02-22 NOTE — Progress Notes (Signed)
Gynecologic Oncology Telehealth Note: Gyn-Onc  I connected with Renee Payne on 02/22/23 at  4:20 PM EST by telephone and verified that I am speaking with the correct person using two identifiers.  I discussed the limitations, risks, security and privacy concerns of performing an evaluation and management service by telemedicine and the availability of in-person appointments. I also discussed with the patient that there may be a patient responsible charge related to this service. The patient expressed understanding and agreed to proceed.  Other persons participating in the visit and their role in the encounter: none.  Patient's location: home Provider's location: [facility]  Reason for Visit: follow-up  Treatment History: The patient was admitted in April for symptomatic anemia.  At that time, there had been interval enlargement of a large, dominant uterine fundal mass, suspected to be a fibroid, now measuring up to 13 cm.  On prior MRI from the end of 2023, this mass measured 8.7 x 5.9 x 7.6.  The mass is heterogenous with areas of increased density.  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  Mild right hydronephrosis and proximal right hydroureter favored to reflect mass effect on the right ureter.  No pelvic adenopathy noted.  Received 2 units of packed red blood cells during that admission and was transition from warfarin to Eliquis for anticoagulation given her history of pulmonary embolism.   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  She has also been intermittently noted to have thrombocytopenia and anemia.  Her most recent hemoglobin was 9.9.   Patient had a prior ischemic stroke, no residual deficits.  She has a history of pulmonary embolism diagnosed in 2017.  She was found to have a left lower extremity DVT at that time.  She was incidentally found to have an aortic aneurysm at that time with yearly follow-up recommended.  Given massive size of  her PE, she was given tPA.  Patient had been on Coumadin but earlier this year was transitioned to Eliquis.  She also has a history of MGUS, stage IIIb chronic kidney disease, hypertension, and heart block requiring a pacemaker.  She has diabetes for which she is currently not on any medications.  Checks her blood sugars from time to time, all under 150.  01/19/23: Robotic-assisted laparoscopic total hysterectomy with bilateral salpingo-oophorectomy, laparotomy for specimen removal   Interval History: Doing well. Lots of gas pain. Bowel function improving.  Denies urinary symptoms, no vaginal bleeding.  Past Medical/Surgical History: Past Medical History:  Diagnosis Date   Chronic kidney disease    Diabetes mellitus without complication (HCC)    was on metformin but discontinued   Hyperlipidemia    Hypertension    Pacemaker 2024   Pulmonary emboli [facility])    patient doesn't know why , on lifelong blood thinner   Stroke [facility])     Past Surgical History:  Procedure Laterality Date   IVC FILTER INSERTION     LAPAROTOMY WITH STAGING N/A 02/15/2023   Procedure: MINI LAPAROTOMY FOR SPECIMEN DELIVERY;  Surgeon: Carver Fila, MD;  Location: [facility] ORS;  Service: Gynecology;  Laterality: N/A;   PACEMAKER IMPLANT N/A 06/20/2022   Procedure: PACEMAKER IMPLANT - DUAL CHAMBER;  Surgeon: Lanier Prude, MD;  Location: [facility] INVASIVE CV LAB;  Service: Cardiovascular;  Laterality: N/A;   ROBOTIC ASSISTED TOTAL HYSTERECTOMY WITH BILATERAL SALPINGO OOPHERECTOMY N/A 02/15/2023   Procedure: XI ROBOTIC ASSISTED TOTAL HYSTERECTOMY WITH BILATERAL SALPINGO OOPHORECTOMY;  Surgeon: Carver Fila, MD;  Location:  [facility] ORS;  Service: Gynecology;  Laterality: N/A;   TUBAL LIGATION      Family History  Problem Relation Age of Onset   Cervical cancer Mother    Colon cancer Neg Hx    Breast cancer Neg Hx    Endometrial cancer Neg Hx    Pancreatic cancer Neg Hx    Prostate cancer Neg Hx     Social  History   Socioeconomic History   Marital status: Single    Spouse name: Not on file   Number of children: Not on file   Years of education: Not on file   Highest education level: Not on file  Occupational History   Not on file  Tobacco Use   Smoking status: Never   Smokeless tobacco: Current    Types: Chew   Tobacco comments:    Chews 2x a day, start at 12  Vaping Use   Vaping status: Never Used  Substance and Sexual Activity   Alcohol use: No   Drug use: No   Sexual activity: Not Currently  Other Topics Concern   Not on file  Social History Narrative   Not on file   Social Determinants of Health   Financial Resource Strain: Not on file  Food Insecurity: No Food Insecurity (02/15/2023)   Hunger Vital Sign    Worried About Running Out of Food in the Last Year: Never true    Ran Out of Food in the Last Year: Never true  Transportation Needs: No Transportation Needs (02/15/2023)   PRAPARE - Administrator, Civil Service (Medical): No    Lack of Transportation (Non-Medical): No  Physical Activity: Not on file  Stress: Not on file  Social Connections: Not on file    Current Medications:  Current Outpatient Medications:    acetaminophen (TYLENOL) 500 MG tablet, Take 1,000 mg by mouth every 6 (six) hours as needed for moderate pain., Disp: , Rfl:    apixaban (ELIQUIS) 2.5 MG TABS tablet, Take 1 tablet (2.5 mg total) by mouth 2 (two) times daily., Disp: 10 tablet, Rfl: 0   Coenzyme Q10 (COQ10) 100 MG CAPS, Take 100 mg by mouth 2 (two) times daily., Disp: , Rfl:    LINZESS 145 MCG CAPS capsule, Take 145 mcg by mouth daily., Disp: , Rfl:    losartan (COZAAR) 50 MG tablet, Take 50 mg by mouth daily., Disp: , Rfl:    polyethylene glycol (MIRALAX) 17 g packet, Take 17 g by mouth daily., Disp: 14 each, Rfl: 0   rosuvastatin (CRESTOR) 20 MG tablet, Take 20 mg by mouth at bedtime., Disp: , Rfl:    traMADol (ULTRAM) 50 MG tablet, Take 1 tablet (50 mg total) by mouth  every 12 (twelve) hours as needed for severe pain (pain score 7-10). For AFTER surgery only, do not take and drive, Disp: , Rfl:    vitamin B-12 (CYANOCOBALAMIN) 500 MCG tablet, Take 500 mcg by mouth daily., Disp: , Rfl:    White Petrolatum-Mineral Oil (EYE LUBRICANT OP), Place 2 drops into both eyes as needed (dry eye)., Disp: , Rfl:   Review of Symptoms: Pertinent positives as per HPI.  Physical Exam: Deferred given limitations of phone visit.  Laboratory & Radiologic Studies: A. UTERUS, CERVIX, BILATERAL FALLOPIAN TUBES AND OVARIES, HYSTERECTOMY: Cervix     Nabothian cysts     Negative for dysplasia Endometrium     Cystic atrophy     Negative for atypia or malignancy Myometrium  Leiomyoma with cellular features     Additional leiomyomata     See comment Bilateral ovaries     Endosalpingosis     Negative for endometriosis or malignancy Bilateral fallopian tubes     Unremarkable     Negative for endometriosis or malignancy  COMMENT: Thank you The largest myometrial nodule is 7.4 cm and has diffusely increased cellularity and areas of degenerative change with hemosiderin deposition.  The nuclear morphology is diffusely uniform and the mitotic count is less than 2 per 10 high-power fields.  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  Dr. Kenyon Ana reviewed slides of the largest myometrial tumor and agrees.   Assessment & Plan: Renee Payne is a 72 y.o. 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  Doing well. Discussed continued expectations and restrictions. Reviewed pathology with her.   I discussed the assessment and treatment plan with the patient. The patient was provided with an opportunity to ask questions and all were answered. The patient agreed with the plan and demonstrated an understanding of the  instructions.   The patient was advised to call back or see an in-person evaluation if the symptoms worsen or if the condition fails to improve as anticipated.   6 minutes of total time was spent for this patient encounter, including preparation, phone counseling with the patient and coordination of care, and documentation of the encounter.   Eugene Garnet, MD  Division of Gynecologic Oncology  Department of Obstetrics and Gynecology  [facility]

## 2023-02-24 ENCOUNTER — Telehealth: Payer: Self-pay | Admitting: *Deleted

## 2023-02-24 NOTE — Telephone Encounter (Signed)
Spoke with Renee Payne who called the office and asked when she should increase her Eliquis back to her original dose of 5 mg twice daily. Pt currently is taking 2.5 mg twice daily. Reiterated to patient that Dr. Pricilla Holm instructed to take the 2.5 mg twice daily for 5 days starting November 2nd then if no bleeding can increase to 5 mg twice dailey.   Pt states she is not having any vaginal bleeding and she is day 7th. Pt instructed she can start taking the 5 mg twice daily starting today. Pt verbalized understanding. Pt also was asking about the derma bound that is peeling off of her abdominal incisions that feel hard. Advised patient that the derma bound will dissolve and peel and it's ok to get in the shower and wash normally but don't scrub the areas of derma bound. Pt denies fever, chills and or pain. She is having bowel movements with Linzess. Pt also denies all urinary symptoms. Pt advised to call with any concerns or questions and reminded of her post op appt. With [name], NP on 11/22 at 1 pm. Pt verbalized understanding.

## 2023-03-10 ENCOUNTER — Inpatient Hospital Stay: Payer: Medicare HMO | Admitting: Gynecologic Oncology

## 2023-03-10 ENCOUNTER — Inpatient Hospital Stay: Payer: Medicare HMO

## 2023-03-10 ENCOUNTER — Telehealth: Payer: Self-pay

## 2023-03-10 ENCOUNTER — Telehealth: Payer: Self-pay | Admitting: Cardiovascular Disease

## 2023-03-10 ENCOUNTER — Other Ambulatory Visit: Payer: Self-pay | Admitting: Gynecologic Oncology

## 2023-03-10 ENCOUNTER — Inpatient Hospital Stay (HOSPITAL_BASED_OUTPATIENT_CLINIC_OR_DEPARTMENT_OTHER): Payer: Medicare HMO | Admitting: Gynecologic Oncology

## 2023-03-10 VITALS — BP 140/68 | HR 100 | Temp 97.8°F | Resp 20 | Wt 161.0 lb

## 2023-03-10 DIAGNOSIS — R000 Tachycardia, unspecified: Secondary | ICD-10-CM

## 2023-03-10 DIAGNOSIS — D72819 Decreased white blood cell count, unspecified: Secondary | ICD-10-CM | POA: Diagnosis not present

## 2023-03-10 DIAGNOSIS — R42 Dizziness and giddiness: Secondary | ICD-10-CM

## 2023-03-10 DIAGNOSIS — Z90710 Acquired absence of both cervix and uterus: Secondary | ICD-10-CM

## 2023-03-10 DIAGNOSIS — E876 Hypokalemia: Secondary | ICD-10-CM

## 2023-03-10 DIAGNOSIS — D259 Leiomyoma of uterus, unspecified: Secondary | ICD-10-CM

## 2023-03-10 DIAGNOSIS — N858 Other specified noninflammatory disorders of uterus: Secondary | ICD-10-CM

## 2023-03-10 DIAGNOSIS — D472 Monoclonal gammopathy: Secondary | ICD-10-CM | POA: Diagnosis present

## 2023-03-10 DIAGNOSIS — D219 Benign neoplasm of connective and other soft tissue, unspecified: Secondary | ICD-10-CM | POA: Diagnosis not present

## 2023-03-10 DIAGNOSIS — Z90722 Acquired absence of ovaries, bilateral: Secondary | ICD-10-CM

## 2023-03-10 LAB — BASIC METABOLIC PANEL
Anion gap: 6 (ref 5–15)
BUN: 36 mg/dL — ABNORMAL HIGH (ref 8–23)
CO2: 27 mmol/L (ref 22–32)
Calcium: 9.6 mg/dL (ref 8.9–10.3)
Chloride: 109 mmol/L (ref 98–111)
Creatinine, Ser: 1.66 mg/dL — ABNORMAL HIGH (ref 0.44–1.00)
GFR, Estimated: 32 mL/min — ABNORMAL LOW (ref >60)
Glucose, Bld: 78 mg/dL (ref 70–99)
Potassium: 3 mmol/L — ABNORMAL LOW (ref 3.5–5.1)
Sodium: 142 mmol/L (ref 135–145)

## 2023-03-10 LAB — CBC (CANCER CENTER ONLY)
HCT: 30.6 % — ABNORMAL LOW (ref 36.0–46.0)
Hemoglobin: 9.3 g/dL — ABNORMAL LOW (ref 12.0–15.0)
MCH: 28 pg (ref 26.0–34.0)
MCHC: 30.4 g/dL (ref 30.0–36.0)
MCV: 92.2 fL (ref 80.0–100.0)
Platelet Count: 276 10*3/uL (ref 150–400)
RBC: 3.32 MIL/uL — ABNORMAL LOW (ref 3.87–5.11)
RDW: 17.2 % — ABNORMAL HIGH (ref 11.5–15.5)
WBC Count: 5.2 10*3/uL (ref 4.0–10.5)
nRBC: 0 % (ref 0.0–0.2)

## 2023-03-10 MED ORDER — POTASSIUM CHLORIDE CRYS ER 20 MEQ PO TBCR: 20.0000 meq | EXTENDED_RELEASE_TABLET | Freq: Two times a day (BID) | ORAL | 0 refills | Status: AC

## 2023-03-10 NOTE — Telephone Encounter (Signed)
Spoke w/ pt. Got a manual transmission. Normal function w/ no pertinent arrhythmias. Pt requested call back on daughters phone that's listed in epic.  Thank you!

## 2023-03-10 NOTE — Progress Notes (Unsigned)
Patient presents today to the office for postoperative follow-up.  She states she has been doing well from a GYN postop standpoint.  She has been tolerating her diet with no nausea or emesis.  Bladder is functioning without difficulty.  No vaginal bleeding or discharge reported.  She is having normal bowel movements.  She does state sometimes she has some mild bleeding with wiping from her hemorrhoids intermittently.  No lower extremity edema reported.  Incisions are healing without difficulty.  She does state for the past week she has noticed that her heart seems to beat faster after eating.  She also experiences this with movement.  She also has an increase of shortness of breath after eating as well.  She has reached out to her cardiology office to have her pacemaker report checked.  She denies any new cough, coughing up blood, sputum production.  She does have dizziness when her heart begins to beat faster as well.  Surgery standpoint she feels like she is doing well minus the cardiac issues currently going on.  Heart rate in the office is currently at 78.  Lungs are clear on auscultation.  Heart in a regular rhythm with an occasional extra beat.  Abdomen is soft nondistended with active bowel sounds.  On pelvic examination vaginal cuff is intact.  Stool noted in the rectum.  Plan for lab work today.  She is going to follow-up with her cardiologist.  She will be contacted with the results of these labs.  Review of systems intake form, positive for shortness of breath, constipation, lightheadedness, decreased concentration, fatigue, dizziness, depression

## 2023-03-10 NOTE — Telephone Encounter (Signed)
Spoke with patient and she states after she eats she gets dizzy, weak and feels like her heart is racing. She also states she gets SOB of breath as well. States sometimes when getting up to walk she gets dizzy.   Currently feels fine and is asymptomatic. She would like a remote check on her device just to make sure everything is ok.  Did advise to change positions slowly when getting up. Staying hydrated HR currently 61. Will forward to device clinic.

## 2023-03-10 NOTE — Telephone Encounter (Signed)
LVM for patient to call office regarding recent labs, and to check if she has heard from the cardiology office. Also, PCP needs to be updated in chart. Once updated office will fax a copy of her labs to them.

## 2023-03-10 NOTE — Telephone Encounter (Signed)
-----   Message from Doylene Bode sent at 03/10/2023  3:32 PM EST ----- Please call the patient and see if she has heard from the cardiology office. If not please call the cards office. She has a pacemaker and felt her heart racing at times, shortness of breath, and dizziness at times.   Please let her know her CBC (blood counts) are stable with her hemoglobin is increasing. Her kidney function is better than when we checked it last at 1.66 from 2.11. Would work on staying hydrated. Her potassium level is at 3.0. I will order replacement to her pharmacy.

## 2023-03-10 NOTE — Patient Instructions (Addendum)
We will check lab work today and contact you with the results. Work on trying to hydrate more.  You are healing well after surgery. Continue with your restrictions of no heavy lifting or strenuous activity for the full six weeks after surgery. Nothing in the vagina for the ten to twelve weeks.   No follow up needed with GYN Oncology unless needs arise in the future. Please call the office for any needs or concerns.

## 2023-03-10 NOTE — Telephone Encounter (Signed)
Pt c/o Shortness Of Breath: STAT if SOB developed within the last 24 hours or pt is noticeably SOB on the phone  1. Are you currently SOB (can you hear that pt is SOB on the phone)?   No  2. How long have you been experiencing SOB?   A couple of days ago  3. Are you SOB when sitting or when up moving around?   Both  4. Are you currently experiencing any other symptoms? Headaches, dizziness  Patient stated she is concerned about her SOB and wants to know next steps.

## 2023-03-10 NOTE — Telephone Encounter (Signed)
Left voicemail to return call to office.

## 2023-03-13 ENCOUNTER — Telehealth: Payer: Self-pay | Admitting: Cardiovascular Disease

## 2023-03-13 ENCOUNTER — Telehealth: Payer: Self-pay | Admitting: Cardiology

## 2023-03-13 NOTE — Progress Notes (Deleted)
  Cardiology Office Note:  .   Date:  03/13/2023  ID:  Renee BARRACLOUGH, DOB 02-Oct-1950, MRN [id number] PCP: Ruthell Rummage, PA-C  [facility] Providers Cardiologist:  None Electrophysiologist:  Lanier Prude, MD { Click to update primary MD,subspecialty MD or APP then REFRESH:1}   Patient Profile: .      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 She underwent PPM implant 06/20/2022.   Last cardiology clinic visit was 01/20/2023 with Dr. Clifton James.  She was seen for preoperative risk evaluation for resection of a uterine mass.  Echo March 2024 with LVEF 60 to 65%, normal RV function, mild MR.  She was deemed suitable to proceed with surgical procedure without further cardiac testing.  She contacted our office 03/13/2023 with shortness of breath and dizziness for about a week occurring with position changes.  She reported SOB with exertion and sometimes at rest.  She does not monitor BP regularly.  Is recently prescribed potassium supplement for hypokalemia.  She also felt like she might be coming down with a cold.  Device transmission received 03/10/2023 showed normal device function.       History of Present Illness: .   Renee Payne is a *** 72 y.o. female ***  Hgb, post op  Discussed the use of AI scribe software for clinical note transcription with the patient, who gave verbal consent to proceed.   ROS: ***       Studies Reviewed: .        *** Risk Assessment/Calculations:   {Does this patient have ATRIAL FIBRILLATION?:[phone]} No BP recorded.  {Refresh Note OR Click here to enter BP  :1}***       Physical Exam:   VS:  There were no vitals taken for this visit.   Wt Readings from Last 3 Encounters:  03/10/23 161 lb (73 kg)  02/15/23 162 lb  (73.5 kg)  02/10/23 163 lb (73.9 kg)    GEN: Well nourished, well developed in no acute distress NECK: No JVD; No carotid bruits CARDIAC: ***RRR, no murmurs, rubs, gallops RESPIRATORY:  Clear to auscultation without rales, wheezing or rhonchi  ABDOMEN: Soft, non-tender, non-distended EXTREMITIES:  No edema; No deformity     ASSESSMENT AND PLAN: .     CHB s/p PPM implant:     {Are you ordering a CV Procedure (e.g. stress test, cath, DCCV, TEE, etc)?   Press F2        :[id number]}  Dispo: ***  Signed, Eligha Bridegroom, NP-C

## 2023-03-13 NOTE — Telephone Encounter (Signed)
Patient reports shortness of breath and dizziness for about a week. She states that she gets dizzy when she stands up too fast. Educated patient on changing positions slowly and staying adequately hydrated. She denies any chest pain or swelling. She states that shortness of breath occurs with exertion and also sometimes at rest. She does not monitor her blood pressure regularly but states that most recent reading last Friday was 154/69. Encouraged her to start checking this regularly.  She reports feeling like she might be coming down with a cold. Encouraged her to follow up with her PCP. She did recently have lab work done at PCP showing low potassium levels and PCP prescribed potassium supplement which she has not picked up. Encouraged her to pick that up and start taking it.

## 2023-03-13 NOTE — Telephone Encounter (Signed)
Patient scheduled for an office visit tomorrow 11/26

## 2023-03-13 NOTE — Telephone Encounter (Signed)
Patient wants to know the results of her last remote transmission.

## 2023-03-13 NOTE — Telephone Encounter (Signed)
Pt c/o Shortness Of Breath: STAT if SOB developed within the last 24 hours or pt is noticeably SOB on the phone  1. Are you currently SOB (can you hear that pt is SOB on the phone)? no  2. How long have you been experiencing SOB? A week  3. Are you SOB when sitting or when up moving around? both  4. Are you currently experiencing any other symptoms? Weakness in chest but states not chest pain and dizziness

## 2023-03-13 NOTE — Telephone Encounter (Addendum)
I spoke to Renee Payne, she states she had a "remote check" with her cardiologist on Friday before her appointment with our office. She states she did receive a call from the Cardiology office Friday afternoon and she has not called them back. I told her to call them today because [name] NP put in a referral for her to follow up with them. She voiced an understanding.   I also reviewed her recent labs with her and told her to stay hydrated and to pick up the prescription for potassium [name] sent to her pharmacist. She also voiced an understanding.  She said her PCP is [facility] in [place]. I will update this in her chart and fax recent labs to them. Labs were also faxed to her Cardiologist Dr. Steffanie Dunn

## 2023-03-13 NOTE — Progress Notes (Deleted)
Cardiology Office Note:  .   Date:  03/13/2023  ID:  Renee Payne, DOB [birth year], MRN [id number] PCP: Ruthell Rummage, PA-C  [facility] Providers Cardiologist:  None Electrophysiologist:  Lanier Prude, MD {  History of Present Illness: .   Renee Payne is a 72 y.o. 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 Lalla Brothers 09/23/22, doing well, low AF burden, no changes made  She saw Dr. Clifton James 01/20/23, pendinh hysterectomy for mass, denied any symptoms, felt an acceptable risk for planned surgery.  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  03/10/23, reached out c/o racing heart, dizzy, weak with ambulation  had GYN post-op visit the same day, HR there was 78, planned for labs, abd exam benign, advised to follow up with cardiology  Device clinic RN note reported no abnormal findings on device Labs were OK, H/H and renal function improving post-op  03/10/23 K+ 3.0 BUN/Creat 36/1.66 (from 2.11 post  op) WBC 5.2 H/h 9.3/30.6 Plts 276  Today's visit is scheduled for c/o palpitations, dizziness, symptoms  ROS: ***  *** Symptoms, orthostatics  *** labs were ok *** burden? *** eliquis, dose, labs *** hypokalemia    Device information Abbott dual chamber PPM implanted 43/4/24  Arrhythmia/AAD hx AFib 1st mentioned in her chart April 2024  Studies Reviewed: Marland Kitchen    EKG done today and reviewed by myself:  ***  DEVICE interrogation done today and reviewed by myself *** Battery and lead measurements are good ***   06/20/22: TTE 1. Patient is bradycardic in complete heart block during echo  examination.   2. Left ventricular ejection fraction, by  estimation, is 60 to 65%. The  left ventricle has normal function. The left ventricle has no regional  wall motion abnormalities. Left ventricular diastolic parameters are  indeterminate.   3. Right ventricular systolic function is normal. The right ventricular  size is normal. There is moderately elevated pulmonary artery systolic  pressure.   4. Mild mitral valve regurgitation.   5. The aortic valve is tricuspid. Aortic valve regurgitation is trivial.   6. The inferior vena cava is normal in size with greater than 50%  respiratory variability, suggesting right atrial pressure of 3 mmHg.    Risk Assessment/Calculations:    Physical Exam:   VS:  There were no vitals taken for this visit.   Wt Readings from Last 3 Encounters:  03/10/23 161 lb (73 kg)  02/15/23 162 lb (73.5 kg)  02/10/23 163 lb (73.9 kg)    GEN: Well nourished, well developed in no acute distress NECK: No JVD; No carotid bruits CARDIAC: ***RRR, no murmurs, rubs, gallops RESPIRATORY:  *** CTA b/l without rales, wheezing or rhonchi  ABDOMEN: Soft, non-tender, non-distended EXTREMITIES:  No edema; No deformity   PPM site: *** is stable, no thinning, fluctuation, tethering  ASSESSMENT AND PLAN: .    PPM ***  Paroxysmal Afib CHA2DS2Vasc is 5, on eliquis, *** appropriately dosed *** % burden  Secondary hypercoagulable state 2/2 AFib     {Are you ordering a CV Procedure (e.g. stress test, cath, DCCV, TEE, etc)?   Press F2        :[id number]}     Dispo: ***  Signed, Sheilah Pigeon, PA-C

## 2023-03-13 NOTE — Telephone Encounter (Signed)
Contacted patient and advised that the manual transmission received on 11/22 showed normal device function. Advised that the patient complaints of SOB and dizziness do not appear to be device related. Advised patient I would forward to general cardiology for further follow up.

## 2023-03-14 ENCOUNTER — Ambulatory Visit: Payer: Medicare HMO | Admitting: Physician Assistant

## 2023-03-14 ENCOUNTER — Ambulatory Visit: Payer: Medicare HMO | Admitting: Nurse Practitioner

## 2023-03-23 ENCOUNTER — Telehealth: Payer: Self-pay | Admitting: Cardiovascular Disease

## 2023-03-23 NOTE — Telephone Encounter (Signed)
Pt c/o Shortness Of Breath: STAT if SOB developed within the last 24 hours or pt is noticeably SOB on the phone  1. Are you currently SOB (can you hear that pt is SOB on the phone)? Yes   2. How long have you been experiencing SOB? About a week. She states it got better but its back this morning and worse.   3. Are you SOB when sitting or when up moving around? Both   4. Are you currently experiencing any other symptoms? Dizziness

## 2023-03-23 NOTE — Telephone Encounter (Signed)
Pt has been having some dizziness with position changes this week and it has been worsening... she notices it most when getting up from the bed/ chair... she has also been having some SOB with rest and exertion... has been sleeping on her sided as she normally does... she has been having some lower extremity "soreness" but no edema. No chest pain.   Pt has been coughing this week with some phlegm production/ clear color.... no fever.   Pt does not have a BP cuff at home.   She has been eating and drinking well.   Will forward to Dr Clifton James for review... unable to make an appt for the pt in the next week.   Pt to call and talk with her PCP to evaluate her for non-cardiac causes.

## 2023-03-27 ENCOUNTER — Emergency Department (HOSPITAL_COMMUNITY)
Admission: EM | Admit: 2023-03-27 | Discharge: 2023-03-28 | Discharge status: Against Medical Advice | Payer: Medicare HMO | Attending: Emergency Medicine | Admitting: Emergency Medicine

## 2023-03-27 ENCOUNTER — Other Ambulatory Visit: Payer: Self-pay

## 2023-03-27 ENCOUNTER — Encounter (HOSPITAL_COMMUNITY): Payer: Self-pay

## 2023-03-27 DIAGNOSIS — Z1152 Encounter for screening for COVID-19: Secondary | ICD-10-CM | POA: Diagnosis not present

## 2023-03-27 DIAGNOSIS — Z5321 Procedure and treatment not carried out due to patient leaving prior to being seen by health care provider: Secondary | ICD-10-CM | POA: Diagnosis not present

## 2023-03-27 DIAGNOSIS — R0602 Shortness of breath: Secondary | ICD-10-CM | POA: Diagnosis present

## 2023-03-27 LAB — RESP PANEL BY RT-PCR (RSV, FLU A&B, COVID)  RVPGX2
Influenza A by PCR: NEGATIVE
Influenza B by PCR: NEGATIVE
Resp Syncytial Virus by PCR: NEGATIVE
SARS Coronavirus 2 by RT PCR: NEGATIVE

## 2023-03-27 LAB — BRAIN NATRIURETIC PEPTIDE: B Natriuretic Peptide: 66.2 pg/mL (ref 0.0–100.0)

## 2023-03-27 LAB — BASIC METABOLIC PANEL
Anion gap: 9 (ref 5–15)
BUN: 27 mg/dL — ABNORMAL HIGH (ref 8–23)
CO2: 21 mmol/L — ABNORMAL LOW (ref 22–32)
Calcium: 9.5 mg/dL (ref 8.9–10.3)
Chloride: 108 mmol/L (ref 98–111)
Creatinine, Ser: 2.12 mg/dL — ABNORMAL HIGH (ref 0.44–1.00)
GFR, Estimated: 24 mL/min — ABNORMAL LOW (ref >60)
Glucose, Bld: 96 mg/dL (ref 70–99)
Potassium: 4 mmol/L (ref 3.5–5.1)
Sodium: 138 mmol/L (ref 135–145)

## 2023-03-27 LAB — CBC WITH DIFFERENTIAL/PLATELET
Abs Immature Granulocytes: 0.02 10*3/uL (ref 0.00–0.07)
Basophils Absolute: 0.1 10*3/uL (ref 0.0–0.1)
Basophils Relative: 1 %
Eosinophils Absolute: 0 10*3/uL (ref 0.0–0.5)
Eosinophils Relative: 1 %
HCT: 30.7 % — ABNORMAL LOW (ref 36.0–46.0)
Hemoglobin: 9.2 g/dL — ABNORMAL LOW (ref 12.0–15.0)
Immature Granulocytes: 1 %
Lymphocytes Relative: 17 %
Lymphs Abs: 0.6 10*3/uL — ABNORMAL LOW (ref 0.7–4.0)
MCH: 28.1 pg (ref 26.0–34.0)
MCHC: 30 g/dL (ref 30.0–36.0)
MCV: 93.9 fL (ref 80.0–100.0)
Monocytes Absolute: 0.3 10*3/uL (ref 0.1–1.0)
Monocytes Relative: 9 %
Neutro Abs: 2.6 10*3/uL (ref 1.7–7.7)
Neutrophils Relative %: 71 %
Platelets: 257 10*3/uL (ref 150–400)
RBC: 3.27 MIL/uL — ABNORMAL LOW (ref 3.87–5.11)
RDW: 16.2 % — ABNORMAL HIGH (ref 11.5–15.5)
WBC: 3.6 10*3/uL — ABNORMAL LOW (ref 4.0–10.5)
nRBC: 0 % (ref 0.0–0.2)

## 2023-03-27 LAB — TROPONIN I (HIGH SENSITIVITY)
Troponin I (High Sensitivity): 26 ng/L — ABNORMAL HIGH (ref <18)
Troponin I (High Sensitivity): 34 ng/L — ABNORMAL HIGH (ref <18)

## 2023-03-27 NOTE — ED Notes (Signed)
Pt left AMA after getting out of triage for second troponin blood draw

## 2023-03-27 NOTE — Telephone Encounter (Signed)
Spoke with patients daughter and she has gone to see her PCP today.  Will follow up with her tomorrow to see how the visit went.  Daughter appreciative of my call back.

## 2023-03-27 NOTE — ED Triage Notes (Signed)
Pt state she was seen at [facility] today for SOB. Pt was told her O2 levels was a little low. Discharge paperwork O2 91%. Pt was walking to bathroom to kitchen and felt she wasn't able to catch her breath. Pt denies N/V or any respiratory ailments. Pt was told Xray can be viewed on a website and wasn't given results. Pt has no pain when breathing deeply.

## 2023-03-27 NOTE — Telephone Encounter (Signed)
Pt daughter called in very upset about not getting a c/b and nobody following up with her. She would like her mother seen today or tomorrow, please advise. Called triage to take the call, was told they will c/b. Pt daughter refused c/b and asked to speak with supervisor.

## 2023-03-27 NOTE — ED Provider Triage Note (Signed)
Emergency Medicine Provider Triage Evaluation Note  Renee Payne , a 72 y.o. female  was evaluated in triage.  Pt complains of sob. Stawrted 2 weeks ago. Worse with exertion and lying flat.  Was seen by PCP today and told O2 was low. Endorses cough but no fever.   Review of Systems  Positive: See above Negative: See above  Physical Exam  BP 111/87 (BP Location: Right Arm)   Pulse 85   Temp 99.5 F (37.5 C) (Oral)   Resp (!) 26   Ht 5\' 1"  (1.549 m)   Wt 72.1 kg   SpO2 100%   BMI 30.04 kg/m  Gen:   Awake, no distress   Resp:  Normal effort  MSK:   Moves extremities without difficulty  Other:    Medical Decision Making  Medically screening exam initiated at 7:06 PM.  Appropriate orders placed.  Renee Payne was informed that the remainder of the evaluation will be completed by another provider, this initial triage assessment does not replace that evaluation, and the importance of remaining in the ED until their evaluation is complete.  Work up started   Renee Payne, [place] 03/27/23 1907

## 2023-04-03 NOTE — Telephone Encounter (Signed)
Patient went to ER 12/9 and left AMA.  Per Chart:  Spoke to pt who reports she stayed for several hours and left. Reports she was SOB but doing better and reports she will be seeing PCP on 04/05/23. Declined need for sooner appt. Reports she does not wear oxygen and no issues with chest pain, heart palpitations or dizziness, fever or any other issues. Reports has PCP number if needed

## 2023-04-06 ENCOUNTER — Telehealth: Payer: Medicare HMO | Admitting: Gynecologic Oncology

## 2023-04-10 ENCOUNTER — Ambulatory Visit: Payer: Medicare HMO

## 2023-04-10 DIAGNOSIS — I442 Atrioventricular block, complete: Secondary | ICD-10-CM

## 2023-04-10 LAB — CUP PACEART REMOTE DEVICE CHECK
Battery Remaining Longevity: 100 mo
Battery Remaining Percentage: 93 %
Battery Voltage: 3.01 V
Brady Statistic AP VP Percent: 8.8 %
Brady Statistic AP VS Percent: 1 %
Brady Statistic AS VP Percent: 90 %
Brady Statistic AS VS Percent: 1 %
Brady Statistic RA Percent Paced: 8.3 %
Brady Statistic RV Percent Paced: 99 %
Date Time Interrogation Session: 20241223020013
Implantable Lead Connection Status: 753985
Implantable Lead Connection Status: 753985
Implantable Lead Implant Date: 20240304
Implantable Lead Implant Date: 20240304
Implantable Lead Location: 753859
Implantable Lead Location: 753860
Implantable Pulse Generator Implant Date: 20240304
Lead Channel Impedance Value: 400 Ohm
Lead Channel Impedance Value: 410 Ohm
Lead Channel Pacing Threshold Amplitude: 0.5 V
Lead Channel Pacing Threshold Amplitude: 1.25 V
Lead Channel Pacing Threshold Pulse Width: 0.5 ms
Lead Channel Pacing Threshold Pulse Width: 0.5 ms
Lead Channel Sensing Intrinsic Amplitude: 12 mV
Lead Channel Sensing Intrinsic Amplitude: 5 mV
Lead Channel Setting Pacing Amplitude: 1.5 V
Lead Channel Setting Pacing Amplitude: 2 V
Lead Channel Setting Pacing Pulse Width: 0.5 ms
Lead Channel Setting Sensing Sensitivity: 4 mV
Pulse Gen Model: 2272
Pulse Gen Serial Number: 8164109

## 2023-05-18 NOTE — Progress Notes (Signed)
Remote pacemaker transmission.

## 2023-07-10 ENCOUNTER — Ambulatory Visit (INDEPENDENT_AMBULATORY_CARE_PROVIDER_SITE_OTHER): Payer: Medicare HMO

## 2023-07-10 DIAGNOSIS — I442 Atrioventricular block, complete: Secondary | ICD-10-CM | POA: Diagnosis not present

## 2023-07-11 LAB — CUP PACEART REMOTE DEVICE CHECK
Battery Remaining Longevity: 96 mo
Battery Remaining Percentage: 90 %
Battery Voltage: 2.99 V
Brady Statistic AP VP Percent: 8.4 %
Brady Statistic AP VS Percent: 1 %
Brady Statistic AS VP Percent: 91 %
Brady Statistic AS VS Percent: 1 %
Brady Statistic RA Percent Paced: 7.8 %
Brady Statistic RV Percent Paced: 99 %
Date Time Interrogation Session: 20250324030611
Implantable Lead Connection Status: 753985
Implantable Lead Connection Status: 753985
Implantable Lead Implant Date: 20240304
Implantable Lead Implant Date: 20240304
Implantable Lead Location: 753859
Implantable Lead Location: 753860
Implantable Pulse Generator Implant Date: 20240304
Lead Channel Impedance Value: 400 Ohm
Lead Channel Impedance Value: 410 Ohm
Lead Channel Pacing Threshold Amplitude: 0.5 V
Lead Channel Pacing Threshold Amplitude: 1.375 V
Lead Channel Pacing Threshold Pulse Width: 0.5 ms
Lead Channel Pacing Threshold Pulse Width: 0.5 ms
Lead Channel Sensing Intrinsic Amplitude: 12 mV
Lead Channel Sensing Intrinsic Amplitude: 5 mV
Lead Channel Setting Pacing Amplitude: 1.625
Lead Channel Setting Pacing Amplitude: 2 V
Lead Channel Setting Pacing Pulse Width: 0.5 ms
Lead Channel Setting Sensing Sensitivity: 4 mV
Pulse Gen Model: 2272
Pulse Gen Serial Number: 8164109

## 2023-07-15 ENCOUNTER — Encounter: Payer: Self-pay | Admitting: Cardiology

## 2023-08-28 NOTE — Addendum Note (Signed)
 Addended by: Edra Govern D on: 08/28/2023 03:27 PM   Modules accepted: Orders

## 2023-08-28 NOTE — Progress Notes (Signed)
 Remote pacemaker transmission.

## 2023-10-09 ENCOUNTER — Ambulatory Visit (INDEPENDENT_AMBULATORY_CARE_PROVIDER_SITE_OTHER): Payer: Medicare HMO

## 2023-10-09 DIAGNOSIS — I442 Atrioventricular block, complete: Secondary | ICD-10-CM

## 2023-10-10 LAB — CUP PACEART REMOTE DEVICE CHECK
Battery Remaining Longevity: 94 mo
Battery Remaining Percentage: 87 %
Battery Voltage: 2.99 V
Brady Statistic AP VP Percent: 6.8 %
Brady Statistic AP VS Percent: 1 %
Brady Statistic AS VP Percent: 92 %
Brady Statistic AS VS Percent: 1 %
Brady Statistic RA Percent Paced: 6.3 %
Brady Statistic RV Percent Paced: 99 %
Date Time Interrogation Session: 20250623034314
Implantable Lead Connection Status: 753985
Implantable Lead Connection Status: 753985
Implantable Lead Implant Date: 20240304
Implantable Lead Implant Date: 20240304
Implantable Lead Location: 753859
Implantable Lead Location: 753860
Implantable Pulse Generator Implant Date: 20240304
Lead Channel Impedance Value: 400 Ohm
Lead Channel Impedance Value: 440 Ohm
Lead Channel Pacing Threshold Amplitude: 0.5 V
Lead Channel Pacing Threshold Amplitude: 1.25 V
Lead Channel Pacing Threshold Pulse Width: 0.5 ms
Lead Channel Pacing Threshold Pulse Width: 0.5 ms
Lead Channel Sensing Intrinsic Amplitude: 12 mV
Lead Channel Sensing Intrinsic Amplitude: 5 mV
Lead Channel Setting Pacing Amplitude: 1.5 V
Lead Channel Setting Pacing Amplitude: 2 V
Lead Channel Setting Pacing Pulse Width: 0.5 ms
Lead Channel Setting Sensing Sensitivity: 4 mV
Pulse Gen Model: 2272
Pulse Gen Serial Number: 8164109

## 2023-10-11 ENCOUNTER — Ambulatory Visit: Payer: Self-pay | Admitting: Cardiology

## 2023-11-13 NOTE — Addendum Note (Signed)
 Addended by: TAWNI DRILLING D on: 11/13/2023 02:56 PM   Modules accepted: Orders

## 2023-11-13 NOTE — Progress Notes (Signed)
 Remote pacemaker transmission.

## 2023-11-16 ENCOUNTER — Ambulatory Visit: Admitting: Cardiovascular Disease

## 2024-01-05 ENCOUNTER — Encounter (HOSPITAL_COMMUNITY): Payer: Self-pay

## 2024-01-05 ENCOUNTER — Other Ambulatory Visit: Payer: Self-pay

## 2024-01-05 ENCOUNTER — Emergency Department (HOSPITAL_COMMUNITY)

## 2024-01-05 ENCOUNTER — Emergency Department (HOSPITAL_COMMUNITY)
Admission: EM | Admit: 2024-01-05 | Discharge: 2024-01-06 | Disposition: A | Discharge status: Home / Self Care | Attending: Emergency Medicine | Admitting: Emergency Medicine

## 2024-01-05 DIAGNOSIS — R0602 Shortness of breath: Secondary | ICD-10-CM | POA: Diagnosis present

## 2024-01-05 DIAGNOSIS — Z7901 Long term (current) use of anticoagulants: Secondary | ICD-10-CM | POA: Diagnosis not present

## 2024-01-05 LAB — BASIC METABOLIC PANEL WITH GFR
Anion gap: 12 (ref 5–15)
BUN: 44 mg/dL — ABNORMAL HIGH (ref 8–23)
CO2: 20 mmol/L — ABNORMAL LOW (ref 22–32)
Calcium: 10.4 mg/dL — ABNORMAL HIGH (ref 8.9–10.3)
Chloride: 106 mmol/L (ref 98–111)
Creatinine, Ser: 2.43 mg/dL — ABNORMAL HIGH (ref 0.44–1.00)
GFR, Estimated: 20 mL/min — ABNORMAL LOW (ref >60)
Glucose, Bld: 126 mg/dL — ABNORMAL HIGH (ref 70–99)
Potassium: 4.1 mmol/L (ref 3.5–5.1)
Sodium: 138 mmol/L (ref 135–145)

## 2024-01-05 LAB — CBC
HCT: 33.3 % — ABNORMAL LOW (ref 36.0–46.0)
Hemoglobin: 10 g/dL — ABNORMAL LOW (ref 12.0–15.0)
MCH: 27.9 pg (ref 26.0–34.0)
MCHC: 30 g/dL (ref 30.0–36.0)
MCV: 92.8 fL (ref 80.0–100.0)
Platelets: 342 K/uL (ref 150–400)
RBC: 3.59 MIL/uL — ABNORMAL LOW (ref 3.87–5.11)
RDW: 14.6 % (ref 11.5–15.5)
WBC: 4.7 K/uL (ref 4.0–10.5)
nRBC: 0 % (ref 0.0–0.2)

## 2024-01-05 LAB — BRAIN NATRIURETIC PEPTIDE: B Natriuretic Peptide: 120.7 pg/mL — ABNORMAL HIGH (ref 0.0–100.0)

## 2024-01-05 NOTE — ED Provider Triage Note (Signed)
 Emergency Medicine Provider Triage Evaluation Note  Renee Payne , a 73 y.o. female  was evaluated in triage.  Pt complains of shortness of breath.  Patient has history of COPD.  Patient has been having trouble breathing for several days.  Patient also has been depressed.  Denies any plan to kill herself.  Recently finished antibiotics for UTI  Review of Systems  Positive: Shortness of breath Negative:   Physical Exam  BP 129/82 (BP Location: Left Arm)   Pulse 100   Temp 98.8 F (37.1 C) (Oral)   Resp 16   Ht 5' 1 (1.549 m)   Wt 72 kg   SpO2 100%   BMI 29.99 kg/m  Gen:   Awake, slightly tachypneic Resp:  Slightly tachypneic but no obvious wheezing MSK:   Moves extremities without difficulty  Other:    Medical Decision Making  Medically screening exam initiated at 8:59 PM.  Appropriate orders placed.  Renee Payne was informed that the remainder of the evaluation will be completed by another provider, this initial triage assessment does not replace that evaluation, and the importance of remaining in the ED until their evaluation is complete.  Renee Payne is a 73 y.o. female here presenting with shortness of breath.  Likely mild COPD exacerbation.  Plan to get labs and chest x-ray.  Patient likely needs nebs and steroids.  Stable to wait in the waiting room    Renee Alm Macho, MD 01/05/24 2100

## 2024-01-05 NOTE — ED Triage Notes (Signed)
 Pt coming in from home reporting shob x 1 week. Pt reports it is worse with exertion. Pt denies pain. Pt reports having a uti 2 weeks ago . Pt denies fever and cough   Ems vitals  Bp 120/76 Hr 92 paced  Rr 16  Spo2 100 on ra Cbg 130  Temp 98.7

## 2024-01-06 LAB — TROPONIN I (HIGH SENSITIVITY): Troponin I (High Sensitivity): 21 ng/L — ABNORMAL HIGH (ref <18)

## 2024-01-06 NOTE — ED Notes (Signed)
 Pt daughter Agnes called to pick up pt, pt wheelchaired to lobby to wait until daughter ride arrives to return pt home. A&Ox4

## 2024-01-06 NOTE — Discharge Instructions (Addendum)
 Your workup here is reassuring.  Please follow-up with your family doctor in the office. Please return for worsening difficulty breathing.

## 2024-01-06 NOTE — ED Provider Notes (Signed)
 [place] EMERGENCY DEPARTMENT AT [facility] Provider Note   CSN: [id number] Arrival date & time: 01/05/24  1936     Patient presents with: Shortness of Breath   Renee Payne is a 73 y.o. female.   73 yo F with a chief complaint of difficulty breathing.  This has been going on for about a week and a half.  She says when she gets up and tries to walk around she gets short of breath.  She has to stop and take a break frequently and she sometimes feels like she is going to pass out.  She said the same thing happened to her last year she went to her doctor and they were unable to figure out what is going on.  Discussed better on its own but then reoccurred.  She denies cough congestion or fever.  Denies chest pain or pressure.  She is on Eliquis  and denies any missed doses.   Shortness of Breath      Prior to Admission medications   Medication Sig Start Date End Date Taking? Authorizing Provider  acetaminophen  (TYLENOL ) 500 MG tablet Take 1,000 mg by mouth every 6 (six) hours as needed for moderate pain.    [provider]  apixaban  (ELIQUIS ) 5 MG TABS tablet Take 1 tablet by mouth 2 (two) times daily. 02/09/23   [provider]  Coenzyme Q10 (COQ10) 100 MG CAPS Take 100 mg by mouth 2 (two) times daily.    [provider]  LINZESS 145 MCG CAPS capsule Take 145 mcg by mouth daily.    [provider]  losartan (COZAAR) 50 MG tablet Take 50 mg by mouth daily. 05/19/22   [provider]  potassium chloride  SA (KLOR-CON  M) 20 MEQ tablet Take 1 tablet (20 mEq total) by mouth 2 (two) times daily. 03/10/23   Cross, Melissa D, NP  rosuvastatin  (CRESTOR ) 20 MG tablet Take 20 mg by mouth at bedtime. 06/17/22   [provider]  vitamin B-12 (CYANOCOBALAMIN ) 500 MCG tablet Take 500 mcg by mouth daily.    [provider]  White Petrolatum-Mineral Oil (EYE LUBRICANT OP) Place 2 drops into both eyes as needed (dry eye).     [provider]    Allergies: Ativan [lorazepam] and Omeprazole    Review of Systems  Respiratory:  Positive for shortness of breath.     Updated Vital Signs BP 127/70   Pulse 75   Temp (!) 97.5 F (36.4 C) (Oral)   Resp 15   Ht 5' 1 (1.549 m)   Wt 72 kg   SpO2 100%   BMI 29.99 kg/m   Physical Exam Vitals and nursing note reviewed.  Constitutional:      General: She is not in acute distress.    Appearance: She is well-developed. She is not diaphoretic.  HENT:     Head: Normocephalic and atraumatic.  Eyes:     Pupils: Pupils are equal, round, and reactive to light.  Cardiovascular:     Rate and Rhythm: Normal rate and regular rhythm.     Heart sounds: No murmur heard.    No friction rub. No gallop.  Pulmonary:     Effort: Pulmonary effort is normal.     Breath sounds: No wheezing or rales.  Abdominal:     General: There is no distension.     Palpations: Abdomen is soft.     Tenderness: There is no abdominal tenderness.  Musculoskeletal:  General: No tenderness.     Cervical back: Normal range of motion and neck supple.  Skin:    General: Skin is warm and dry.  Neurological:     Mental Status: She is alert and oriented to person, place, and time.  Psychiatric:        Behavior: Behavior normal.     (all labs ordered are listed, but only abnormal results are displayed) Labs Reviewed  BASIC METABOLIC PANEL WITH GFR - Abnormal; Notable for the following components:      Result Value   CO2 20 (*)    Glucose, Bld 126 (*)    BUN 44 (*)    Creatinine, Ser 2.43 (*)    Calcium  10.4 (*)    GFR, Estimated 20 (*)    All other components within normal limits  CBC - Abnormal; Notable for the following components:   RBC 3.59 (*)    Hemoglobin 10.0 (*)    HCT 33.3 (*)    All other components within normal limits  BRAIN NATRIURETIC PEPTIDE - Abnormal; Notable for the following components:   B Natriuretic Peptide 120.7 (*)    All other components  within normal limits  TROPONIN I (HIGH SENSITIVITY) - Abnormal; Notable for the following components:   Troponin I (High Sensitivity) 21 (*)    All other components within normal limits    EKG: EKG Interpretation Date/Time:  Saturday January 06 2024 04:34:42 EDT Ventricular Rate:  75 PR Interval:  194 QRS Duration:  181 QT Interval:  463 QTC Calculation: 518 R Axis:   -73  Text Interpretation: Sinus rhythm Right bundle branch block Left ventricular hypertrophy No significant change since last tracing Confirmed by Emil Share [phone]) on 01/06/2024 5:47:46 AM  Radiology: DG Chest 2 View Result Date: 01/05/2024 EXAM: 2 VIEW(S) XRAY OF THE CHEST 01/05/2024 08:10:38 PM COMPARISON: 04/06/2023 CLINICAL HISTORY: Shortness of breath for 1 week, worse with exertion. Patient reports having a UTI 2 weeks ago. FINDINGS: LINES, TUBES AND DEVICES: Left subclavian pacemaker in place. LUNGS AND PLEURA: No focal pulmonary opacity. No pulmonary edema. No pleural effusion. No pneumothorax. HEART AND MEDIASTINUM: No acute abnormality of the cardiac and mediastinal silhouettes. BONES AND SOFT TISSUES: No acute osseous abnormality. IMPRESSION: 1. No acute abnormalities. Electronically signed by: Pinkie Pebbles MD 01/05/2024 08:14 PM EDT RP Workstation: [id number]     Procedures   Medications Ordered in the ED - No data to display                                  Medical Decision Making Amount and/or Complexity of Data Reviewed Labs: ordered. Radiology: ordered. ECG/medicine tests: ordered.   72 yoF with a chief complaint of difficulty breathing.  She tells me that she had very similar symptoms about a year ago.  On record review in December of last year she was seen by her doctor and had a battery of labs and imaging which all seem to be unremarkable.  She is not anemic, no significant electrolyte abnormalities.  Her renal function is a bit worse than last check so the patient tells me she has not  been eating and drinking well.  Chest x-ray independently interpreted by me without focal infiltrate or pneumothorax.  Will obtain a troponin here.  EKG.  I think unlikely to be PE with compliance with her Eliquis .  She has no hypoxia no tachycardia.  I will have her follow-up  with her family doctor in clinic.  5:48 AM:  I have discussed the diagnosis/risks/treatment options with the patient.  Evaluation and diagnostic testing in the emergency department does not suggest an emergent condition requiring admission or immediate intervention beyond what has been performed at this time.  They will follow up with PCP. We also discussed returning to the ED immediately if new or worsening sx occur. We discussed the sx which are most concerning (e.g., sudden worsening pain, fever, inability to tolerate by mouth) that necessitate immediate return. Medications administered to the patient during their visit and any new prescriptions provided to the patient are listed below.  Medications given during this visit Medications - No data to display   The patient appears reasonably screen and/or stabilized for discharge and I doubt any other medical condition or other [facility] requiring further screening, evaluation, or treatment in the ED at this time prior to discharge.       Final diagnoses:  Shortness of breath    ED Discharge Orders     None          Emil Share, [place] 01/06/24 [phone]

## 2024-01-08 ENCOUNTER — Ambulatory Visit (INDEPENDENT_AMBULATORY_CARE_PROVIDER_SITE_OTHER): Payer: Medicare HMO

## 2024-01-08 DIAGNOSIS — I442 Atrioventricular block, complete: Secondary | ICD-10-CM

## 2024-01-08 LAB — CUP PACEART REMOTE DEVICE CHECK
Battery Remaining Longevity: 91 mo
Battery Remaining Percentage: 84 %
Battery Voltage: 2.99 V
Brady Statistic AP VP Percent: 5.9 %
Brady Statistic AP VS Percent: 1 %
Brady Statistic AS VP Percent: 93 %
Brady Statistic AS VS Percent: 1 %
Brady Statistic RA Percent Paced: 5.4 %
Brady Statistic RV Percent Paced: 99 %
Date Time Interrogation Session: 20250922020016
Implantable Lead Connection Status: 753985
Implantable Lead Connection Status: 753985
Implantable Lead Implant Date: 20240304
Implantable Lead Implant Date: 20240304
Implantable Lead Location: 753859
Implantable Lead Location: 753860
Implantable Pulse Generator Implant Date: 20240304
Lead Channel Impedance Value: 410 Ohm
Lead Channel Impedance Value: 450 Ohm
Lead Channel Pacing Threshold Amplitude: 0.5 V
Lead Channel Pacing Threshold Amplitude: 1.375 V
Lead Channel Pacing Threshold Pulse Width: 0.5 ms
Lead Channel Pacing Threshold Pulse Width: 0.5 ms
Lead Channel Sensing Intrinsic Amplitude: 12 mV
Lead Channel Sensing Intrinsic Amplitude: 5 mV
Lead Channel Setting Pacing Amplitude: 1.625
Lead Channel Setting Pacing Amplitude: 2 V
Lead Channel Setting Pacing Pulse Width: 0.5 ms
Lead Channel Setting Sensing Sensitivity: 4 mV
Pulse Gen Model: 2272
Pulse Gen Serial Number: 8164109

## 2024-01-09 NOTE — Progress Notes (Signed)
 Remote PPM Transmission

## 2024-01-10 ENCOUNTER — Ambulatory Visit: Payer: Self-pay | Admitting: Cardiology

## 2024-02-08 ENCOUNTER — Encounter: Payer: Self-pay | Admitting: Cardiovascular Disease

## 2024-02-08 ENCOUNTER — Ambulatory Visit: Attending: Cardiovascular Disease | Admitting: Cardiovascular Disease

## 2024-02-08 VITALS — BP 132/82 | HR 89 | Ht 62.0 in | Wt 167.0 lb

## 2024-02-08 DIAGNOSIS — I442 Atrioventricular block, complete: Secondary | ICD-10-CM

## 2024-02-08 DIAGNOSIS — I428 Other cardiomyopathies: Secondary | ICD-10-CM

## 2024-02-08 DIAGNOSIS — I340 Nonrheumatic mitral (valve) insufficiency: Secondary | ICD-10-CM | POA: Diagnosis not present

## 2024-02-08 NOTE — Patient Instructions (Signed)
 Medication Instructions:  Your physician recommends that you continue on your current medications as directed. Please refer to the Current Medication list given to you today.  *If you need a refill on your cardiac medications before your next appointment, please call your pharmacy*  Lab Work: none If you have labs (blood work) drawn today and your tests are completely normal, you will receive your results only by: MyChart Message (if you have MyChart) OR A paper copy in the mail If you have any lab test that is abnormal or we need to change your treatment, we will call you to review the results.  Testing/Procedures: Your physician has requested that you have an echocardiogram. Echocardiography is a painless test that uses sound waves to create images of your heart. It provides your doctor with information about the size and shape of your heart and how well your heart's chambers and valves are working. This procedure takes approximately one hour. There are no restrictions for this procedure. Please do NOT wear cologne, perfume, aftershave, or lotions (deodorant is allowed). Please arrive 15 minutes prior to your appointment time.  Please note: We ask at that you not bring children with you during ultrasound (echo/ vascular) testing. Due to room size and safety concerns, children are not allowed in the ultrasound rooms during exams. Our front office staff cannot provide observation of children in our lobby area while testing is being conducted. An adult accompanying a patient to their appointment will only be allowed in the ultrasound room at the discretion of the ultrasound technician under special circumstances. We apologize for any inconvenience.   Follow-Up: At [facility], you and your health needs are our priority.  As part of our continuing mission to provide you with exceptional heart care, our providers are all part of one team.  This team includes your primary Cardiologist  (physician) and Advanced Practice Providers or APPs (Physician Assistants and Nurse Practitioners) who all work together to provide you with the care you need, when you need it.  Your next appointment:   12 month(s)  Provider:   Dr Verlin   We recommend signing up for the patient portal called MyChart.  Sign up information is provided on this After Visit Summary.  MyChart is used to connect with patients for Virtual Visits (Telemedicine).  Patients are able to view lab/test results, encounter notes, upcoming appointments, etc.  Non-urgent messages can be sent to your provider as well.   To learn more about what you can do with MyChart, go to [email or web address].   Other Instructions

## 2024-02-08 NOTE — Progress Notes (Signed)
 Chief Complaint  Patient presents with   Follow-up    Complete heart block    History of Present Illness: 73 yo female with history of DM, HLD, HTN, prior CVA, PE and complete heart block s/p permanent pacemaker placement who is here today for follow up. She has been followed in our office by Dr. Cindie. Permanent pacemaker implanted in March 2024 secondary to symptomatic complete heart block. She is not known to have CAD. Echo March 2024 with LVEF=60-65%. Normal RV function. Mild mitral regurgitation. I met her in October 2024 to provide cardiac risk assessment prior to a planned surgical procedure. She was doing well. She is on Eliquis  with history of PE.   She is here today for follow up. The patient denies any chest pain, dyspnea, palpitations, lower extremity edema, orthopnea, PND, dizziness, near syncope or syncope. She apparently had an echo done at [facility] in March 2025 that showed her LVEF was 40% with inferior wall HK.   Primary Care Physician: Artis Roderick Kay, PA-C  Primary Cardiologist: Dr. Cindie  Past Medical History:  Diagnosis Date   Chronic kidney disease    Diabetes mellitus without complication [facility])    was on metformin but discontinued   Hyperlipidemia    Hypertension    Pacemaker 2024   Pulmonary emboli [facility])    patient doesn't know why , on lifelong blood thinner   Stroke [facility])     Past Surgical History:  Procedure Laterality Date   IVC FILTER INSERTION     LAPAROTOMY WITH STAGING N/A 02/15/2023   Procedure: MINI LAPAROTOMY FOR SPECIMEN DELIVERY;  Surgeon: Viktoria Comer SAUNDERS, MD;  Location: [facility] ORS;  Service: Gynecology;  Laterality: N/A;   PACEMAKER IMPLANT N/A 06/20/2022   Procedure: PACEMAKER IMPLANT - DUAL CHAMBER;  Surgeon: Cindie Ole DASEN, MD;  Location: [facility] INVASIVE CV LAB;  Service: Cardiovascular;  Laterality: N/A;   ROBOTIC ASSISTED TOTAL HYSTERECTOMY WITH BILATERAL SALPINGO OOPHERECTOMY N/A 02/15/2023   Procedure: XI ROBOTIC  ASSISTED TOTAL HYSTERECTOMY WITH BILATERAL SALPINGO OOPHORECTOMY;  Surgeon: Viktoria Comer SAUNDERS, MD;  Location: [facility] ORS;  Service: Gynecology;  Laterality: N/A;   TUBAL LIGATION      Current Outpatient Medications  Medication Sig Dispense Refill   acetaminophen  (TYLENOL ) 500 MG tablet Take 1,000 mg by mouth every 6 (six) hours as needed for moderate pain.     apixaban  (ELIQUIS ) 5 MG TABS tablet Take 1 tablet by mouth 2 (two) times daily.     Coenzyme Q10 (COQ10) 100 MG CAPS Take 100 mg by mouth 2 (two) times daily.     LINZESS 145 MCG CAPS capsule Take 145 mcg by mouth daily.     potassium chloride  SA (KLOR-CON  M) 20 MEQ tablet Take 1 tablet (20 mEq total) by mouth 2 (two) times daily. 6 tablet 0   rosuvastatin  (CRESTOR ) 20 MG tablet Take 20 mg by mouth at bedtime.     vitamin B-12 (CYANOCOBALAMIN ) 500 MCG tablet Take 500 mcg by mouth daily.     White Petrolatum-Mineral Oil (EYE LUBRICANT OP) Place 2 drops into both eyes as needed (dry eye).     losartan (COZAAR) 50 MG tablet Take 50 mg by mouth daily. (Patient not taking: Reported on 02/08/2024)     No current facility-administered medications for this visit.    Allergies  Allergen Reactions   Ativan [Lorazepam] Other (See Comments)    COMBATIVE AND EXTREMELY VIOLENT   Omeprazole Other (See Comments)    The front of  her body felt very warm after one dose of this med.    Social History   Socioeconomic History   Marital status: Single    Spouse name: Not on file   Number of children: Not on file   Years of education: Not on file   Highest education level: Not on file  Occupational History   Not on file  Tobacco Use   Smoking status: Never   Smokeless tobacco: Current    Types: Chew   Tobacco comments:    Chews 2x a day, start at 12  Vaping Use   Vaping status: Never Used  Substance and Sexual Activity   Alcohol use: No   Drug use: No   Sexual activity: Not Currently  Other Topics Concern   Not on file  Social History  Narrative   Not on file   Social Drivers of Health   Financial Resource Strain: Not on file  Food Insecurity: Unknown (07/14/2023)   Received from [facility]   Hunger Vital Sign    Within the past 12 months, you worried that your food would run out before you got money to buy more: Patient declined to answer    Within the past 12 months, the food you bought just didn't last and you didn't have money to get more. : Patient declined to answer  Transportation Needs: Not on file (07/14/2023)  Physical Activity: Not on file  Stress: Not on file  Social Connections: Not on file  Intimate Partner Violence: Not At Risk (02/15/2023)   Humiliation, Afraid, Rape, and Kick questionnaire    Fear of Current or Ex-Partner: No    Emotionally Abused: No    Physically Abused: No    Sexually Abused: No    Family History  Problem Relation Age of Onset   Cervical cancer Mother    Colon cancer Neg Hx    Breast cancer Neg Hx    Endometrial cancer Neg Hx    Pancreatic cancer Neg Hx    Prostate cancer Neg Hx     Review of Systems:  As stated in the HPI and otherwise negative.   BP 132/82   Pulse 89   Ht 5' 2 (1.575 m)   Wt 167 lb (75.8 kg)   SpO2 98%   BMI 30.54 kg/m   Physical Examination: General: Well developed, well nourished, NAD  HEENT: OP clear, mucus membranes moist  SKIN: warm, dry. No rashes. Neuro: No focal deficits  Musculoskeletal: Muscle strength 5/5 all ext  Psychiatric: Mood and affect normal  Neck: No JVD, no carotid bruits, no thyromegaly, no lymphadenopathy.  Lungs:Clear bilaterally, no wheezes, rhonci, crackles Cardiovascular: Regular rate and rhythm. No murmurs, gallops or rubs. Abdomen:Soft. Bowel sounds present. Non-tender.  Extremities: No lower extremity edema. Pulses are 2 + in the bilateral DP/PT.  EKG:  EKG is not ordered today. The ekg ordered today demonstrates      Recent Labs: 02/10/2023: ALT 21 01/05/2024: B Natriuretic Peptide 120.7; BUN 44;  Creatinine, Ser 2.43; Hemoglobin 10.0; Platelets 342; Potassium 4.1; Sodium 138   Lipid Panel    Component Value Date/Time   CHOL 254 (H) 11/14/2016 0353   TRIG 112 11/14/2016 0353   HDL 42 11/14/2016 0353   CHOLHDL 6.0 11/14/2016 0353   VLDL 22 11/14/2016 0353   LDLCALC 190 (H) 11/14/2016 0353     Wt Readings from Last 3 Encounters:  02/08/24 167 lb (75.8 kg)  01/05/24 158 lb 11.7 oz (72 kg)  03/27/23 159  lb (72.1 kg)    Assessment and Plan:   Complete heart block: Pacemaker in place. Followed in EP  HTN: BP is in a normal range. Her Losartan was stopped by her primary care doctor  Mitral regurgitation: Mild by echo in March 2024.  Cardiomyopathy: Echo in [facility] in March 2025 with LVEF=40%. I cannot see these images and was not aware of this study having been done. She feels well overall. Will repeat echo here now to assess LV function before making any changes in her medications.   Labs/ tests ordered today include:   Orders Placed This Encounter  Procedures   ECHOCARDIOGRAM COMPLETE   Disposition:   F/U with me in 12 months  Signed, Lonni Cash, MD, [facility] 02/08/2024 4:41 PM    [facility] [address] [place], [place], [place]  [address] Phone: [phone]; Fax: [phone]

## 2024-04-08 ENCOUNTER — Ambulatory Visit: Payer: Medicare HMO

## 2024-04-08 DIAGNOSIS — I442 Atrioventricular block, complete: Secondary | ICD-10-CM

## 2024-04-09 LAB — CUP PACEART REMOTE DEVICE CHECK
Battery Remaining Longevity: 86 mo
Battery Remaining Percentage: 81 %
Battery Voltage: 2.99 V
Brady Statistic AP VP Percent: 5.1 %
Brady Statistic AP VS Percent: 1 %
Brady Statistic AS VP Percent: 94 %
Brady Statistic AS VS Percent: 1 %
Brady Statistic RA Percent Paced: 4.7 %
Brady Statistic RV Percent Paced: 99 %
Date Time Interrogation Session: 20251222020014
Implantable Lead Connection Status: 753985
Implantable Lead Connection Status: 753985
Implantable Lead Implant Date: 20240304
Implantable Lead Implant Date: 20240304
Implantable Lead Location: 753859
Implantable Lead Location: 753860
Implantable Pulse Generator Implant Date: 20240304
Lead Channel Impedance Value: 400 Ohm
Lead Channel Impedance Value: 410 Ohm
Lead Channel Pacing Threshold Amplitude: 0.5 V
Lead Channel Pacing Threshold Amplitude: 1.5 V
Lead Channel Pacing Threshold Pulse Width: 0.5 ms
Lead Channel Pacing Threshold Pulse Width: 0.5 ms
Lead Channel Sensing Intrinsic Amplitude: 12 mV
Lead Channel Sensing Intrinsic Amplitude: 5 mV
Lead Channel Setting Pacing Amplitude: 1.75 V
Lead Channel Setting Pacing Amplitude: 2 V
Lead Channel Setting Pacing Pulse Width: 0.5 ms
Lead Channel Setting Sensing Sensitivity: 4 mV
Pulse Gen Model: 2272
Pulse Gen Serial Number: 8164109

## 2024-04-10 NOTE — Progress Notes (Signed)
 Remote PPM Transmission

## 2024-04-12 ENCOUNTER — Ambulatory Visit: Payer: Self-pay | Admitting: Cardiology

## 2024-04-12 ENCOUNTER — Ambulatory Visit (HOSPITAL_COMMUNITY)
Admission: RE | Admit: 2024-04-12 | Discharge: 2024-04-12 | Disposition: A | Discharge status: Home / Self Care | Source: Ambulatory Visit | Attending: Cardiovascular Disease | Admitting: Cardiovascular Disease

## 2024-04-12 DIAGNOSIS — I428 Other cardiomyopathies: Secondary | ICD-10-CM | POA: Diagnosis present

## 2024-04-12 DIAGNOSIS — I340 Nonrheumatic mitral (valve) insufficiency: Secondary | ICD-10-CM | POA: Insufficient documentation

## 2024-04-13 LAB — ECHOCARDIOGRAM COMPLETE
P 1/2 time: 530 ms
S' Lateral: 3.6 cm

## 2024-04-22 ENCOUNTER — Ambulatory Visit: Payer: Self-pay | Admitting: Cardiovascular Disease

## 2024-05-08 ENCOUNTER — Encounter: Payer: Self-pay | Admitting: Cardiovascular Disease

## 2024-05-08 ENCOUNTER — Ambulatory Visit: Attending: Cardiovascular Disease | Admitting: Cardiovascular Disease

## 2024-05-08 VITALS — BP 114/74 | HR 89 | Ht 62.0 in | Wt 167.0 lb

## 2024-05-08 DIAGNOSIS — I340 Nonrheumatic mitral (valve) insufficiency: Secondary | ICD-10-CM

## 2024-05-08 DIAGNOSIS — I428 Other cardiomyopathies: Secondary | ICD-10-CM | POA: Diagnosis not present

## 2024-05-08 DIAGNOSIS — I10 Essential (primary) hypertension: Secondary | ICD-10-CM | POA: Diagnosis not present

## 2024-05-08 DIAGNOSIS — R0609 Other forms of dyspnea: Secondary | ICD-10-CM

## 2024-05-08 DIAGNOSIS — I442 Atrioventricular block, complete: Secondary | ICD-10-CM | POA: Diagnosis not present

## 2024-05-08 MED ORDER — METOPROLOL SUCCINATE ER 25 MG PO TB24: 25.0000 mg | ORAL_TABLET | Freq: Every day | ORAL | 3 refills | Status: AC

## 2024-05-08 NOTE — Progress Notes (Signed)
 "   Chief Complaint  Patient presents with   Follow-up    Cardiomyopathy    History of Present Illness: 74 yo female with history of DM, HLD, HTN, prior CVA, PE and complete heart block s/p permanent pacemaker placement who is here today for follow up. She has been followed in our office by Dr. Cindie. Permanent pacemaker implanted in March 2024 secondary to symptomatic complete heart block. She is not known to have CAD. Echo March 2024 with LVEF=60-65%. Normal RV function. Mild mitral regurgitation. I met her in October 2024 to provide cardiac risk assessment prior to a planned surgical procedure. She was doing well. She is on Eliquis  with history of PE. I saw her in the office in October 2025 and she reported having had an echo done at [facility] in March 2025 that showed her LVEF was 40% with inferior wall HK. Echo here on 04/12/24 with LVEF=40-45% with global hypokinesis. Mild MR. Dilation of the aortic root 3.8 mm, ascending aorta 4.5 mm.   She is here today for follow up. The patient denies any chest pain, dyspnea, palpitations, lower extremity edema, orthopnea, PND, dizziness, near syncope or syncope.   Primary Care Physician: Minta Jon BROCKS, NP  Primary Cardiologist: Dr. Cindie  Past Medical History:  Diagnosis Date   Chronic kidney disease    Diabetes mellitus without complication (HCC)    was on metformin but discontinued   Hyperlipidemia    Hypertension    Pacemaker 2024   Pulmonary emboli [facility])    patient doesn't know why , on lifelong blood thinner   Stroke [facility])     Past Surgical History:  Procedure Laterality Date   IVC FILTER INSERTION     LAPAROTOMY WITH STAGING N/A 02/15/2023   Procedure: MINI LAPAROTOMY FOR SPECIMEN DELIVERY;  Surgeon: Viktoria Comer SAUNDERS, MD;  Location: [facility] ORS;  Service: Gynecology;  Laterality: N/A;   PACEMAKER IMPLANT N/A 06/20/2022   Procedure: PACEMAKER IMPLANT - DUAL CHAMBER;  Surgeon: Cindie Ole DASEN, MD;  Location: [facility] INVASIVE  CV LAB;  Service: Cardiovascular;  Laterality: N/A;   ROBOTIC ASSISTED TOTAL HYSTERECTOMY WITH BILATERAL SALPINGO OOPHERECTOMY N/A 02/15/2023   Procedure: XI ROBOTIC ASSISTED TOTAL HYSTERECTOMY WITH BILATERAL SALPINGO OOPHORECTOMY;  Surgeon: Viktoria Comer SAUNDERS, MD;  Location: [facility] ORS;  Service: Gynecology;  Laterality: N/A;   TUBAL LIGATION      Current Outpatient Medications  Medication Sig Dispense Refill   acetaminophen  (TYLENOL ) 500 MG tablet Take 1,000 mg by mouth every 6 (six) hours as needed for moderate pain.     apixaban  (ELIQUIS ) 5 MG TABS tablet Take 1 tablet by mouth 2 (two) times daily.     Coenzyme Q10 (COQ10) 100 MG CAPS Take 100 mg by mouth 2 (two) times daily.     hydroxychloroquine (PLAQUENIL) 200 MG tablet Take 200 mg by mouth daily.     leflunomide (ARAVA) 10 MG tablet Take 10 mg by mouth daily.     LINZESS 145 MCG CAPS capsule Take 145 mcg by mouth daily.     metoprolol  succinate (TOPROL  XL) 25 MG 24 hr tablet Take 1 tablet (25 mg total) by mouth daily. 90 tablet 3   potassium chloride  SA (KLOR-CON  M) 20 MEQ tablet Take 1 tablet (20 mEq total) by mouth 2 (two) times daily. 6 tablet 0   rosuvastatin  (CRESTOR ) 20 MG tablet Take 20 mg by mouth at bedtime.     vitamin B-12 (CYANOCOBALAMIN ) 500 MCG tablet Take 500 mcg by mouth daily.  White Petrolatum-Mineral Oil (EYE LUBRICANT OP) Place 2 drops into both eyes as needed (dry eye).     losartan (COZAAR) 50 MG tablet Take 50 mg by mouth daily. (Patient not taking: Reported on 05/08/2024)     No current facility-administered medications for this visit.    Allergies  Allergen Reactions   Ativan [Lorazepam] Other (See Comments)    COMBATIVE AND EXTREMELY VIOLENT   Omeprazole Other (See Comments)    The front of her body felt very warm after one dose of this med.    Social History   Socioeconomic History   Marital status: Single    Spouse name: Not on file   Number of children: Not on file   Years of education: Not  on file   Highest education level: Not on file  Occupational History   Not on file  Tobacco Use   Smoking status: Never   Smokeless tobacco: Current    Types: Chew   Tobacco comments:    Chews 2x a day, start at 12  Vaping Use   Vaping status: Never Used  Substance and Sexual Activity   Alcohol use: No   Drug use: No   Sexual activity: Not Currently  Other Topics Concern   Not on file  Social History Narrative   Not on file   Social Drivers of Health   Tobacco Use: High Risk (05/08/2024)   Patient History    Smoking Tobacco Use: Never    Smokeless Tobacco Use: Current    Passive Exposure: Not on file  Financial Resource Strain: Not on file  Food Insecurity: Unknown (07/14/2023)   Received from [facility]   Epic    Within the past 12 months, you worried that your food would run out before you got money to buy more: Patient declined to answer    Within the past 12 months, the food you bought just didn't last and you didn't have money to get more. : Patient declined to answer  Transportation Needs: Not on file (07/14/2023)  Physical Activity: Not on file  Stress: Not on file  Social Connections: Not on file  Intimate Partner Violence: Not At Risk (02/15/2023)   Humiliation, Afraid, Rape, and Kick questionnaire    Fear of Current or Ex-Partner: No    Emotionally Abused: No    Physically Abused: No    Sexually Abused: No  Depression (PHQ2-9): Not on file  Alcohol Screen: Not on file  Housing: Not on file (07/14/2023)  Utilities: Unknown (07/14/2023)   Received from [facility]   Utilities    In the past 12 months has the electric, gas, oil, or water company threatened to shut off services in your home? : Patient declined to answer  Health Literacy: Not on file    Family History  Problem Relation Age of Onset   Cervical cancer Mother    Colon cancer Neg Hx    Breast cancer Neg Hx    Endometrial cancer Neg Hx    Pancreatic cancer Neg Hx    Prostate cancer Neg Hx      Review of Systems:  As stated in the HPI and otherwise negative.   BP 114/74   Pulse 89   Ht 5' 2 (1.575 m)   Wt 167 lb (75.8 kg)   SpO2 99%   BMI 30.54 kg/m   Physical Examination: General: Well developed, well nourished, NAD  SKIN: warm, dry. Neuro: No focal deficits  Psychiatric: Mood and affect normal  Neck:  No JVD Lungs:Clear bilaterally, no wheezes, rhonci, crackles Cardiovascular: Regular rate and rhythm. No murmurs, gallops or rubs. Abdomen:Soft.  Extremities: No lower extremity edema.    EKG:  EKG is not ordered today. The ekg ordered today demonstrates      Recent Labs: 01/05/2024: B Natriuretic Peptide 120.7; BUN 44; Creatinine, Ser 2.43; Hemoglobin 10.0; Platelets 342; Potassium 4.1; Sodium 138   Lipid Panel    Component Value Date/Time   CHOL 254 (H) 11/14/2016 0353   TRIG 112 11/14/2016 0353   HDL 42 11/14/2016 0353   CHOLHDL 6.0 11/14/2016 0353   VLDL 22 11/14/2016 0353   LDLCALC 190 (H) 11/14/2016 0353     Wt Readings from Last 3 Encounters:  05/08/24 167 lb (75.8 kg)  02/08/24 167 lb (75.8 kg)  01/05/24 158 lb 11.7 oz (72 kg)    Assessment and Plan:   Complete heart block: Pacemaker in place. Followed in EP  HTN: BP is normal. She had been on Losartan but this was stopped by her primary care doctor, presumable because of her CKD.  -Given mild reduction in LV systolic function, will start Toprol  25 mg daily. Will avoid ARB/Ace-inh given CKD  Mitral regurgitation: Mild by echo in December 2025.   Cardiomyopathy: Echo 04/12/24 with LVEF=40-45% with global hypokinesis. She has dyspnea on exertion but no evidence of volume overload.  -Start Toprol  25 mg daily. Will avoid ARB/Ace-inh given CKD -Will arrange a PET CT stress study to exclude ischemia. Unable to move forward with Cardiac CTA given CKD.   Labs/ tests ordered today include:  Orders Placed This Encounter  Procedures   NM PET CT CARDIAC PERFUSION MULTI W/ABSOLUTE BLOODFLOW    Cardiac Stress Test: Informed Consent Details: Physician/Practitioner Attestation; Transcribe to consent form and obtain patient signature   Disposition:   F/U with me in 12 months  Signed, Lonni Cash, MD, [facility] 05/08/2024 1:11 PM    [facility] [address] [place], [place], [place]  [address] Phone: [phone]; Fax: [phone]    "

## 2024-05-08 NOTE — Patient Instructions (Signed)
 Medication Instructions:  Start: Metoprolol  succinate (Toprol  XL) 25 mg, one tablet, once daily  Lab Work: None  Testing/Procedures:  Dr. Verlin has ordered a Cardiac PET Stress Test to be done at either:    Please report to Radiology at the [facility] Main Entrance 30 minutes early for your test.  [address] [place], [place] [address]                         OR   Please report to Radiology at [facility] Main Entrance, medical mall, 30 mins prior to your test.  [address]  [place], [place]  How to Prepare for Your Cardiac PET/CT Stress Test:  Nothing to eat or drink, except water, 3 hours prior to arrival time.  NO caffeine/decaffeinated products, or chocolate 12 hours prior to arrival. (Please note decaffeinated beverages (teas/coffees) still contain caffeine).  If you have caffeine within 12 hours prior, the test will need to be rescheduled.  Medication instructions: Do not take erectile dysfunction medications for 72 hours prior to test (sildenafil, tadalafil) Do not take nitrates (isosorbide mononitrate, Ranexa) the day before or day of test Do not take tamsulosin the day before or morning of test Hold theophylline containing medications for 12 hours. Hold Dipyridamole 48 hours prior to the test.  Diabetic Preparation: If able to eat breakfast prior to 3 hour fasting, you may take all medications, including your insulin . Do not worry if you miss your breakfast dose of insulin  - start at your next meal. If you do not eat prior to 3 hour fast-Hold all diabetes (oral and insulin ) medications. Patients who wear a continuous glucose monitor MUST remove the device prior to scanning.  You may take your remaining medications with water.  NO perfume, cologne or lotion on chest or abdomen area. FEMALES - Please avoid wearing dresses to this appointment.  Total time is 1 to 2 hours; you may want to bring reading material for the  waiting time.  IF YOU THINK YOU MAY BE PREGNANT, OR ARE NURSING PLEASE INFORM THE TECHNOLOGIST.  In preparation for your appointment, medication and supplies will be purchased.  Appointment availability is limited, so if you need to cancel or reschedule, please call the Radiology Department Scheduler at [phone] 24 hours in advance to avoid a cancellation fee of $100.00  What to Expect When you Arrive:  Once you arrive and check in for your appointment, you will be taken to a preparation room within the Radiology Department.  A technologist or Nurse will obtain your medical history, verify that you are correctly prepped for the exam, and explain the procedure.  Afterwards, an IV will be started in your arm and electrodes will be placed on your skin for EKG monitoring during the stress portion of the exam. Then you will be escorted to the PET/CT scanner.  There, staff will get you positioned on the scanner and obtain a blood pressure and EKG.  During the exam, you will continue to be connected to the EKG and blood pressure machines.  A small, safe amount of a radioactive tracer will be injected in your IV to obtain a series of pictures of your heart along with an injection of a stress agent.    After your Exam:  It is recommended that you eat a meal and drink a caffeinated beverage to counter act any effects of the stress agent.  Drink plenty of fluids for  the remainder of the day and urinate frequently for the first couple of hours after the exam.  Your doctor will inform you of your test results within 7-10 business days.  For more information and frequently asked questions, please visit our website: [email or web address]  For questions about your test or how to prepare for your test, please call: Cardiac Imaging Nurse Navigators Office: [phone]   Follow-Up: At [facility], you and your health needs are our priority.  As part of our continuing mission to provide  you with exceptional heart care, our providers are all part of one team.  This team includes your primary Cardiologist (physician) and Advanced Practice Providers or APPs (Physician Assistants and Nurse Practitioners) who all work together to provide you with the care you need, when you need it.  Your next appointment:   1 year(s) (Please call in November 2026 for a January 2027 appointment)  Provider:   Lonni Cash, MD   We recommend signing up for the patient portal called MyChart.  Sign up information is provided on this After Visit Summary.  MyChart is used to connect with patients for Virtual Visits (Telemedicine).  Patients are able to view lab/test results, encounter notes, upcoming appointments, etc.  Non-urgent messages can be sent to your provider as well.   To learn more about what you can do with MyChart, go to [email or web address].

## 2024-06-11 ENCOUNTER — Other Ambulatory Visit (HOSPITAL_COMMUNITY)
